# Patient Record
Sex: Male | Born: 1960
Health system: Southern US, Community
[De-identification: ages and names within clinical notes are randomized; demographics above are authoritative.]

## PROBLEM LIST (undated history)

## (undated) DIAGNOSIS — E785 Hyperlipidemia, unspecified: Secondary | ICD-10-CM

## (undated) DIAGNOSIS — M1990 Unspecified osteoarthritis, unspecified site: Secondary | ICD-10-CM

## (undated) DIAGNOSIS — I499 Cardiac arrhythmia, unspecified: Secondary | ICD-10-CM

## (undated) DIAGNOSIS — E119 Type 2 diabetes mellitus without complications: Secondary | ICD-10-CM

## (undated) DIAGNOSIS — M109 Gout, unspecified: Secondary | ICD-10-CM

## (undated) DIAGNOSIS — K219 Gastro-esophageal reflux disease without esophagitis: Secondary | ICD-10-CM

## (undated) DIAGNOSIS — K7460 Unspecified cirrhosis of liver: Secondary | ICD-10-CM

## (undated) DIAGNOSIS — I10 Essential (primary) hypertension: Secondary | ICD-10-CM

## (undated) DIAGNOSIS — D649 Anemia, unspecified: Secondary | ICD-10-CM

## (undated) HISTORY — PX: ROTATOR CUFF REPAIR: SHX139

## (undated) HISTORY — DX: Gout, unspecified: M10.9

## (undated) HISTORY — DX: Hyperlipidemia, unspecified: E78.5

## (undated) HISTORY — PX: ANTERIOR CRUCIATE LIGAMENT REPAIR: SHX115

## (undated) HISTORY — PX: KNEE SURGERY: SHX244

## (undated) HISTORY — PX: LUMBAR FUSION: SHX111

## (undated) HISTORY — DX: Anemia, unspecified: D64.9

---

## 2000-05-03 ENCOUNTER — Ambulatory Visit (HOSPITAL_COMMUNITY): Admission: RE | Admit: 2000-05-03 | Discharge: 2000-05-03 | Payer: Self-pay | Admitting: Orthopedic Surgery

## 2000-11-10 ENCOUNTER — Other Ambulatory Visit: Admission: RE | Admit: 2000-11-10 | Discharge: 2000-11-10 | Payer: Self-pay | Admitting: Gastroenterology

## 2001-11-22 ENCOUNTER — Encounter: Payer: Self-pay | Admitting: Emergency Medicine

## 2001-11-22 ENCOUNTER — Emergency Department (HOSPITAL_COMMUNITY): Admission: EM | Admit: 2001-11-22 | Discharge: 2001-11-22 | Payer: Self-pay | Admitting: Emergency Medicine

## 2001-11-30 ENCOUNTER — Emergency Department (HOSPITAL_COMMUNITY): Admission: EM | Admit: 2001-11-30 | Discharge: 2001-11-30 | Payer: Self-pay | Admitting: Emergency Medicine

## 2005-04-01 ENCOUNTER — Encounter: Admission: RE | Admit: 2005-04-01 | Discharge: 2005-04-01 | Payer: Self-pay

## 2005-04-06 ENCOUNTER — Ambulatory Visit (HOSPITAL_COMMUNITY): Admission: RE | Admit: 2005-04-06 | Discharge: 2005-04-06 | Payer: Self-pay

## 2005-04-06 ENCOUNTER — Ambulatory Visit (HOSPITAL_BASED_OUTPATIENT_CLINIC_OR_DEPARTMENT_OTHER): Admission: RE | Admit: 2005-04-06 | Discharge: 2005-04-06 | Payer: Self-pay

## 2005-05-24 ENCOUNTER — Emergency Department (HOSPITAL_COMMUNITY): Admission: EM | Admit: 2005-05-24 | Discharge: 2005-05-24 | Payer: Self-pay | Admitting: Emergency Medicine

## 2008-07-11 HISTORY — PX: HERNIA REPAIR: SHX51

## 2014-05-26 ENCOUNTER — Encounter: Payer: Self-pay | Admitting: Internal Medicine

## 2014-08-05 ENCOUNTER — Encounter: Payer: Self-pay | Admitting: Internal Medicine

## 2014-11-21 ENCOUNTER — Encounter (HOSPITAL_COMMUNITY): Payer: Self-pay

## 2014-11-21 ENCOUNTER — Emergency Department (HOSPITAL_COMMUNITY)
Admission: EM | Admit: 2014-11-21 | Discharge: 2014-11-21 | Disposition: A | Discharge status: Home / Self Care | Payer: Self-pay | Source: Home / Self Care | Attending: Family Medicine | Admitting: Family Medicine

## 2014-11-21 DIAGNOSIS — J0101 Acute recurrent maxillary sinusitis: Secondary | ICD-10-CM

## 2014-11-21 MED ORDER — IPRATROPIUM BROMIDE 0.06 % NA SOLN: 2.0000 | Freq: Four times a day (QID) | NASAL | Status: DC

## 2014-11-21 MED ORDER — DOXYCYCLINE HYCLATE 100 MG PO CAPS: 100.0000 mg | ORAL_CAPSULE | Freq: Two times a day (BID) | ORAL | Status: DC

## 2014-11-21 NOTE — ED Notes (Signed)
Provider eval only; NAD

## 2014-11-21 NOTE — ED Provider Notes (Signed)
CSN: [id number]     Arrival date & time 11/21/14  0803 History   First MD Initiated Contact with Patient 11/21/14 [phone]     Chief Complaint  Patient presents with  . Sore Throat   (Consider location/radiation/quality/duration/timing/severity/associated sxs/prior Treatment) Patient is a 54 y.o. male presenting with URI. The history is provided by the patient.  URI Presenting symptoms: congestion, cough, rhinorrhea and sore throat   Presenting symptoms: no fever   Severity:  Mild Onset quality:  Gradual Duration:  4 days Chronicity:  New Relieved by:  None tried Worsened by:  Nothing tried Ineffective treatments:  None tried Associated symptoms: no wheezing     History reviewed. No pertinent past medical history. Past Surgical History  Procedure Laterality Date  . Knee surgery     History reviewed. No pertinent family history. History  Substance Use Topics  . Smoking status: Never Smoker   . Smokeless tobacco: Not on file  . Alcohol Use: No    Review of Systems  Constitutional: Negative.  Negative for fever.  HENT: Positive for congestion, postnasal drip, rhinorrhea and sore throat.   Respiratory: Positive for cough. Negative for shortness of breath and wheezing.   Cardiovascular: Negative.   Gastrointestinal: Negative.     Allergies  Review of patient's allergies indicates no known allergies.  Home Medications   Prior to Admission medications   Medication Sig Start Date End Date Taking? Authorizing Provider  doxycycline (VIBRAMYCIN) 100 MG capsule Take 1 capsule (100 mg total) by mouth 2 (two) times daily. 11/21/14   Linna HoffJames D Kindl, MD  ipratropium (ATROVENT) 0.06 % nasal spray Place 2 sprays into both nostrils 4 (four) times daily. 11/21/14   Linna HoffJames D Kindl, MD   BP 168/100 mmHg  Pulse 94  Temp(Src) 98.4 F (36.9 C) (Oral)  Resp 14  SpO2 96% Physical Exam  Constitutional: He is oriented to person, place, and time. He appears well-developed and well-nourished.   HENT:  Right Ear: External ear normal.  Left Ear: External ear normal.  Mouth/Throat: Oropharynx is clear and moist.  Eyes: EOM are normal. Pupils are equal, round, and reactive to light. Right eye exhibits no discharge. Left eye exhibits no discharge. Right conjunctiva is injected. Left conjunctiva is injected.  Neck: Normal range of motion. Neck supple.  Cardiovascular: Normal heart sounds and intact distal pulses.   Pulmonary/Chest: Effort normal and breath sounds normal. He has no wheezes. He has no rales.  Lymphadenopathy:    He has no cervical adenopathy.  Neurological: He is alert and oriented to person, place, and time.  Skin: Skin is warm and dry.  Nursing note and vitals reviewed.   ED Course  Procedures (including critical care time) Labs Review Labs Reviewed - No data to display  Imaging Review No results found.   MDM   1. Acute recurrent maxillary sinusitis        Linna HoffJames D Kindl, MD 11/21/14 [phone]

## 2016-06-20 ENCOUNTER — Encounter: Payer: Self-pay | Admitting: Emergency Medicine

## 2016-06-20 ENCOUNTER — Emergency Department (INDEPENDENT_AMBULATORY_CARE_PROVIDER_SITE_OTHER)
Admission: EM | Admit: 2016-06-20 | Discharge: 2016-06-20 | Disposition: A | Discharge status: Home / Self Care | Payer: No Typology Code available for payment source | Source: Home / Self Care | Attending: Family Medicine | Admitting: Family Medicine

## 2016-06-20 DIAGNOSIS — R220 Localized swelling, mass and lump, head: Secondary | ICD-10-CM

## 2016-06-20 DIAGNOSIS — R030 Elevated blood-pressure reading, without diagnosis of hypertension: Secondary | ICD-10-CM

## 2016-06-20 MED ORDER — DOXYCYCLINE HYCLATE 100 MG PO CAPS: 100.0000 mg | ORAL_CAPSULE | Freq: Two times a day (BID) | ORAL | 0 refills | Status: DC

## 2016-06-20 NOTE — ED Provider Notes (Signed)
CSN: [id number]     Arrival date & time 06/20/16  1358 History   First MD Initiated Contact with Patient 06/20/16 1427     Chief Complaint  Patient presents with  . Facial Swelling   (Consider location/radiation/quality/duration/timing/severity/associated sxs/prior Treatment) HPI  Caleb [name] is a 55 y.o. male presenting to UC with c/o 3 days of gradually worsening Right side facial swelling that is aching and sore, worse with opening and closing his mouth. Pain is 5/10 at worst. He notes he had flu-like symptoms with body aches, cough and congestion last week. Those symptoms have nearly resolved, but now facial symptoms gradually worsening. Subjective fever.  Denies difficulty breathing or swallowing. Denies dental pain or inner ear pain.    BP elevated to 164/111. Pt denies known hx of HTN but also notes he does not have a PCP he f/u with routinely. Family hx of HTN. Denies chest pain, headache, dizziness, or SOB.  History reviewed. No pertinent past medical history. Past Surgical History:  Procedure Laterality Date  . KNEE SURGERY     No family history on file. Social History  Substance Use Topics  . Smoking status: Never Smoker  . Smokeless tobacco: Never Used  . Alcohol use Yes    Review of Systems  Constitutional: Positive for fever. Negative for chills and fatigue.  HENT: Positive for congestion and facial swelling (Right side). Negative for ear pain and sore throat.   Respiratory: Positive for cough. Negative for shortness of breath, wheezing and stridor.   Gastrointestinal: Negative for diarrhea, nausea and vomiting.  Musculoskeletal: Negative for arthralgias and myalgias.  Skin: Negative for color change, rash and wound.    Allergies  Patient has no known allergies.  Home Medications   Prior to Admission medications   Medication Sig Start Date End Date Taking? Authorizing Provider  ibuprofen (ADVIL,MOTRIN) 200 MG tablet Take 200 mg by mouth every 6 (six)  hours as needed.   Yes Historical Provider, MD  doxycycline (VIBRAMYCIN) 100 MG capsule Take 1 capsule (100 mg total) by mouth 2 (two) times daily. One po bid x 7 days 06/20/16   Junius FinnerErin O'Malley, PA-C  ipratropium (ATROVENT) 0.06 % nasal spray Place 2 sprays into both nostrils 4 (four) times daily. 11/21/14   Linna HoffJames D Kindl, MD   Meds Ordered and Administered this Visit  Medications - No data to display  BP (!) 164/111 (BP Location: Left Arm)   Pulse 111   Temp 98.4 F (36.9 C) (Oral)   Ht 6' (1.829 m)   Wt 262 lb (118.8 kg)   SpO2 98%   BMI 35.53 kg/m  No data found.   Physical Exam  Constitutional: He is oriented to person, place, and time. He appears well-developed and well-nourished. No distress.  HENT:  Head: Normocephalic and atraumatic.    Right Ear: Tympanic membrane normal. No mastoid tenderness.  Left Ear: Tympanic membrane normal. No mastoid tenderness.  Nose: Nose normal.  Mouth/Throat: Uvula is midline, oropharynx is clear and moist and mucous membranes are normal. No trismus in the jaw. Abnormal dentition.  Mild to moderate edema to Right side of face. Mildly tender. No warmth. Pt has beard, no significant erythema.  Multiple missing teeth. No gingival or dental tenderness.   Eyes: EOM are normal.  Neck: Normal range of motion. Neck supple.  Cardiovascular: Normal rate and regular rhythm.   Pulmonary/Chest: Effort normal and breath sounds normal. No respiratory distress. He has no wheezes. He has no rales.  Musculoskeletal:  Normal range of motion.  Neurological: He is alert and oriented to person, place, and time.  Skin: Skin is warm and dry. He is not diaphoretic.  Psychiatric: He has a normal mood and affect. His behavior is normal.  Nursing note and vitals reviewed.   Urgent Care Course   Clinical Course     Procedures (including critical care time)  Labs Review Labs Reviewed - No data to display  Imaging Review No results found.   MDM   1. Right  facial swelling   2. Elevated blood pressure reading    Right side facial swelling with mild tenderness. Concern for possible parotiditis vs cellulitis.  Rx: Doxycycline   BP elevated. Discussed risk of untreated HTN. Encouraged to establish care with a PCP for further evaluation and treatment of elevated BP. Patient verbalized understanding and agreement with treatment plan.     Junius Finnerrin O'Malley, PA-C 06/20/16 1515

## 2016-06-20 NOTE — ED Triage Notes (Addendum)
Right jaw swelling and painful x 3 days, sharp pain in right temple intermittently

## 2016-06-20 NOTE — Discharge Instructions (Signed)
°  Your blood pressure reading was high today.  That could be due to infection, pain, or anxiety of being in a medical office, however, it is important to follow up with a primary care provider next week for a blood pressure recheck.  If your blood pressure stays elevated and untreated, it can increase your risk of stroke, heart disease including heart attacks, or even kidney failure, which would mean you would need dialysis.  High blood pressure tends to go unnoticed unless you get routine exams as there are typically no side effects until it is too high. High blood pressure can be managed by a low sodium (salt) diet and exercise as well as medication (pills).  Typically it can be managed by one pill daily or twice daily, other times, multiple medications may be needed.

## 2018-10-15 ENCOUNTER — Ambulatory Visit: Payer: No Typology Code available for payment source | Admitting: Adult Health

## 2019-10-18 ENCOUNTER — Ambulatory Visit
Admission: RE | Admit: 2019-10-18 | Discharge: 2019-10-18 | Disposition: A | Discharge status: Home / Self Care | Payer: Worker's Compensation | Source: Ambulatory Visit | Attending: Nurse Practitioner | Admitting: Nurse Practitioner

## 2019-10-18 ENCOUNTER — Other Ambulatory Visit: Payer: Self-pay

## 2019-10-18 ENCOUNTER — Other Ambulatory Visit: Payer: Self-pay | Admitting: Nurse Practitioner

## 2019-10-18 DIAGNOSIS — M542 Cervicalgia: Secondary | ICD-10-CM

## 2020-03-03 ENCOUNTER — Encounter (HOSPITAL_COMMUNITY): Payer: Self-pay | Admitting: Emergency Medicine

## 2020-03-03 ENCOUNTER — Emergency Department (HOSPITAL_COMMUNITY)
Admission: EM | Admit: 2020-03-03 | Discharge: 2020-03-03 | Disposition: A | Discharge status: Home / Self Care | Payer: 59 | Attending: Emergency Medicine | Admitting: Emergency Medicine

## 2020-03-03 ENCOUNTER — Emergency Department (HOSPITAL_COMMUNITY): Payer: 59

## 2020-03-03 ENCOUNTER — Other Ambulatory Visit: Payer: Self-pay

## 2020-03-03 DIAGNOSIS — R112 Nausea with vomiting, unspecified: Secondary | ICD-10-CM | POA: Diagnosis not present

## 2020-03-03 DIAGNOSIS — R42 Dizziness and giddiness: Secondary | ICD-10-CM | POA: Diagnosis not present

## 2020-03-03 DIAGNOSIS — R509 Fever, unspecified: Secondary | ICD-10-CM | POA: Diagnosis present

## 2020-03-03 DIAGNOSIS — Z20822 Contact with and (suspected) exposure to covid-19: Secondary | ICD-10-CM | POA: Diagnosis not present

## 2020-03-03 DIAGNOSIS — B349 Viral infection, unspecified: Secondary | ICD-10-CM | POA: Diagnosis not present

## 2020-03-03 DIAGNOSIS — E119 Type 2 diabetes mellitus without complications: Secondary | ICD-10-CM | POA: Diagnosis not present

## 2020-03-03 LAB — CK: Total CK: 42 U/L — ABNORMAL LOW (ref 49–397)

## 2020-03-03 LAB — HEPATIC FUNCTION PANEL
ALT: 50 U/L — ABNORMAL HIGH (ref 0–44)
AST: 39 U/L (ref 15–41)
Albumin: 2.8 g/dL — ABNORMAL LOW (ref 3.5–5.0)
Alkaline Phosphatase: 113 U/L (ref 38–126)
Bilirubin, Direct: 0.2 mg/dL (ref 0.0–0.2)
Indirect Bilirubin: 0.7 mg/dL (ref 0.3–0.9)
Total Bilirubin: 0.9 mg/dL (ref 0.3–1.2)
Total Protein: 7.3 g/dL (ref 6.5–8.1)

## 2020-03-03 LAB — URINALYSIS, ROUTINE W REFLEX MICROSCOPIC
Bacteria, UA: NONE SEEN
Bilirubin Urine: NEGATIVE
Glucose, UA: >=500 mg/dL — AB
Hgb urine dipstick: NEGATIVE
Ketones, ur: NEGATIVE mg/dL
Leukocytes,Ua: NEGATIVE
Nitrite: NEGATIVE
Protein, ur: NEGATIVE mg/dL
Specific Gravity, Urine: 1.014 (ref 1.005–1.030)
pH: 6 (ref 5.0–8.0)

## 2020-03-03 LAB — BASIC METABOLIC PANEL
Anion gap: 15 (ref 5–15)
BUN: 20 mg/dL (ref 6–20)
CO2: 21 mmol/L — ABNORMAL LOW (ref 22–32)
Calcium: 8.8 mg/dL — ABNORMAL LOW (ref 8.9–10.3)
Chloride: 94 mmol/L — ABNORMAL LOW (ref 98–111)
Creatinine, Ser: 1 mg/dL (ref 0.61–1.24)
GFR calc Af Amer: >60 mL/min (ref >60)
GFR calc non Af Amer: >60 mL/min (ref >60)
Glucose, Bld: 346 mg/dL — ABNORMAL HIGH (ref 70–99)
Potassium: 3.8 mmol/L (ref 3.5–5.1)
Sodium: 130 mmol/L — ABNORMAL LOW (ref 135–145)

## 2020-03-03 LAB — LIPASE, BLOOD: Lipase: 33 U/L (ref 11–51)

## 2020-03-03 LAB — CBC
HCT: 45.4 % (ref 39.0–52.0)
Hemoglobin: 15.4 g/dL (ref 13.0–17.0)
MCH: 31 pg (ref 26.0–34.0)
MCHC: 33.9 g/dL (ref 30.0–36.0)
MCV: 91.3 fL (ref 80.0–100.0)
Platelets: 236 10*3/uL (ref 150–400)
RBC: 4.97 MIL/uL (ref 4.22–5.81)
RDW: 11.7 % (ref 11.5–15.5)
WBC: 6.5 10*3/uL (ref 4.0–10.5)
nRBC: 0 % (ref 0.0–0.2)

## 2020-03-03 LAB — HEMOGLOBIN A1C
Hgb A1c MFr Bld: 9.7 % — ABNORMAL HIGH (ref 4.8–5.6)
Mean Plasma Glucose: 231.69 mg/dL

## 2020-03-03 LAB — TROPONIN I (HIGH SENSITIVITY)
Troponin I (High Sensitivity): 10 ng/L (ref <18)
Troponin I (High Sensitivity): 12 ng/L (ref <18)

## 2020-03-03 LAB — SARS CORONAVIRUS 2 BY RT PCR (HOSPITAL ORDER, PERFORMED IN ~~LOC~~ HOSPITAL LAB): SARS Coronavirus 2: NEGATIVE

## 2020-03-03 MED ORDER — ACETAMINOPHEN 325 MG PO TABS
650.0000 mg | ORAL_TABLET | Freq: Once | ORAL | Status: AC | PRN
  Administered 2020-03-03: 650 mg via ORAL
  Filled 2020-03-03: qty 2

## 2020-03-03 MED ORDER — SODIUM CHLORIDE 0.9 % IV BOLUS
1000.0000 mL | Freq: Once | INTRAVENOUS | Status: AC
Start: 2020-03-03 — End: 2020-03-03
  Administered 2020-03-03: 1000 mL via INTRAVENOUS

## 2020-03-03 MED ORDER — ACETAMINOPHEN 500 MG PO TABS
1000.0000 mg | ORAL_TABLET | Freq: Once | ORAL | Status: AC
  Administered 2020-03-03: 1000 mg via ORAL
  Filled 2020-03-03: qty 2

## 2020-03-03 MED ORDER — METFORMIN HCL 500 MG PO TABS: 500.0000 mg | ORAL_TABLET | Freq: Two times a day (BID) | ORAL | 0 refills | Status: DC

## 2020-03-03 NOTE — Discharge Instructions (Signed)
Regarding your fever, chills, recommend taking Tylenol Motrin as needed for any fevers.  If you develop worsening vomiting, chest pain, difficulty breathing, neck pain, neck stiffness, abdominal pain or other new concerning symptom, please return to the emergency room for reassessment.  Regarding concern for new diagnosis of type 2 diabetes, it is very important that you follow-up with your primary care doctor.  Recommend starting the Metformin.

## 2020-03-03 NOTE — Progress Notes (Signed)
ED CM attempted to see the patient at the bedside. The patient has been discharged and has left the hospital. CM called the patient and informed him that he can call the number on his [facility] card to request the contact information for in-network providers. CM also provided the patient with the telephone number for [facility] to assist in locating an in-network provider. The patient verbalizes understanding.

## 2020-03-03 NOTE — ED Provider Notes (Signed)
[facility] EMERGENCY DEPARTMENT Provider Note   CSN: [id number] Arrival date & time: 03/03/20  1200     History Chief Complaint  Patient presents with  . Fever  . Dizziness    Caleb [name] is a 59 y.o. male.  Presents to ER with concern for fever, chills, lightheadedness.  Patient reports for the past couple weeks he has had intermittent episodes of chills, dull headache, feeling somewhat dizzy.  No room spinning sensation, more lightheaded, unsteadiness.  Earlier today had an episode of nausea and vomiting.  Currently does not feel nauseous, headache is mild, 1-2 out of 10 in severity, frontal.  Headache not sudden onset, no associated neck pain, neck stiffness.  No rash.  No abscess.    HPI     History reviewed. No pertinent past medical history.  There are no problems to display for this patient.   Past Surgical History:  Procedure Laterality Date  . KNEE SURGERY         History reviewed. No pertinent family history.  Social History   Tobacco Use  . Smoking status: Never Smoker  . Smokeless tobacco: Never Used  Substance Use Topics  . Alcohol use: Yes  . Drug use: Not on file    Home Medications Prior to Admission medications   Medication Sig Start Date End Date Taking? Authorizing Provider  doxycycline (VIBRAMYCIN) 100 MG capsule Take 1 capsule (100 mg total) by mouth 2 (two) times daily. One po bid x 7 days 06/20/16   Lurene Shadow, PA-C  ibuprofen (ADVIL,MOTRIN) 200 MG tablet Take 200 mg by mouth every 6 (six) hours as needed.    [provider]  ipratropium (ATROVENT) 0.06 % nasal spray Place 2 sprays into both nostrils 4 (four) times daily. 11/21/14   Linna Hoff, MD  metFORMIN (GLUCOPHAGE) 500 MG tablet Take 1 tablet (500 mg total) by mouth 2 (two) times daily with a meal. 03/03/20   Milagros Loll, MD    Allergies    Patient has no known allergies.  Review of Systems   Review of Systems  Constitutional:  Positive for chills and fever.  HENT: Negative for ear pain and sore throat.   Eyes: Negative for pain and visual disturbance.  Respiratory: Negative for cough and shortness of breath.   Cardiovascular: Negative for chest pain and palpitations.  Gastrointestinal: Positive for nausea and vomiting. Negative for abdominal pain.  Genitourinary: Negative for dysuria and hematuria.  Musculoskeletal: Negative for arthralgias and back pain.  Skin: Negative for color change and rash.  Neurological: Positive for dizziness and light-headedness. Negative for seizures and syncope.  All other systems reviewed and are negative.   Physical Exam Updated Vital Signs BP (!) 127/95   Pulse 94   Temp 97.8 F (36.6 C)   Resp 16   Ht 6\' 1"  (1.854 m)   Wt 113.4 kg   SpO2 99%   BMI 32.98 kg/m   Physical Exam Vitals and nursing note reviewed.  Constitutional:      Appearance: He is well-developed.  HENT:     Head: Normocephalic and atraumatic.     Nose: Nose normal.     Mouth/Throat:     Mouth: Mucous membranes are moist.  Eyes:     Conjunctiva/sclera: Conjunctivae normal.  Cardiovascular:     Rate and Rhythm: Normal rate and regular rhythm.     Heart sounds: No murmur heard.   Pulmonary:     Effort: Pulmonary effort is  normal. No respiratory distress.     Breath sounds: Normal breath sounds.  Abdominal:     Palpations: Abdomen is soft.     Tenderness: There is no abdominal tenderness.  Musculoskeletal:        General: No deformity or signs of injury.     Cervical back: Normal range of motion and neck supple. No rigidity or tenderness.  Skin:    General: Skin is warm and dry.     Capillary Refill: Capillary refill takes less than 2 seconds.  Neurological:     General: No focal deficit present.     Mental Status: He is alert and oriented to person, place, and time.  Psychiatric:        Mood and Affect: Mood normal.        Behavior: Behavior normal.     ED Results / Procedures /  Treatments   Labs (all labs ordered are listed, but only abnormal results are displayed) Labs Reviewed  BASIC METABOLIC PANEL - Abnormal; Notable for the following components:      Result Value   Sodium 130 (*)    Chloride 94 (*)    CO2 21 (*)    Glucose, Bld 346 (*)    Calcium 8.8 (*)    All other components within normal limits  URINALYSIS, ROUTINE W REFLEX MICROSCOPIC - Abnormal; Notable for the following components:   Glucose, UA >=500 (*)    All other components within normal limits  HEPATIC FUNCTION PANEL - Abnormal; Notable for the following components:   Albumin 2.8 (*)    ALT 50 (*)    All other components within normal limits  HEMOGLOBIN A1C - Abnormal; Notable for the following components:   Hgb A1c MFr Bld 9.7 (*)    All other components within normal limits  CK - Abnormal; Notable for the following components:   Total CK 42 (*)    All other components within normal limits  SARS CORONAVIRUS 2 BY RT PCR (HOSPITAL ORDER, PERFORMED IN [facility])  CBC  LIPASE, BLOOD  TROPONIN I (HIGH SENSITIVITY)  TROPONIN I (HIGH SENSITIVITY)    EKG EKG Interpretation  Date/Time:  Tuesday March 03 2020 12:02:39 EDT Ventricular Rate:  135 PR Interval:  126 QRS Duration: 92 QT Interval:  318 QTC Calculation: 477 R Axis:   49 Text Interpretation: Sinus tachycardia Possible Left atrial enlargement ST & T wave abnormality, consider inferior ischemia Abnormal ECG no acute STEMI Confirmed by Marianna Fuss ([id number]) on 03/03/2020 6:40:05 PM   Radiology DG Chest 2 View  Result Date: 03/03/2020 CLINICAL DATA:  Shortness of breath EXAM: CHEST - 2 VIEW COMPARISON:  None. FINDINGS: The heart size and mediastinal contours are within normal limits. Both lungs are clear. No pleural effusion or pneumothorax. The visualized skeletal structures are unremarkable. IMPRESSION: No acute process in the chest. Electronically Signed   By: Guadlupe Spanish M.D.   On: 03/03/2020 14:31    CT Head Wo Contrast  Result Date: 03/03/2020 CLINICAL DATA:  Headache EXAM: CT HEAD WITHOUT CONTRAST TECHNIQUE: Contiguous axial images were obtained from the base of the skull through the vertex without intravenous contrast. COMPARISON:  None. FINDINGS: Brain: No acute intracranial abnormality. Specifically, no hemorrhage, hydrocephalus, mass lesion, acute infarction, or significant intracranial injury. Vascular: No hyperdense vessel or unexpected calcification. Skull: No acute calvarial abnormality. Sinuses/Orbits: Visualized paranasal sinuses and mastoids clear. Orbital soft tissues unremarkable. Other: None IMPRESSION: No acute intracranial abnormality. Electronically Signed   By: Caryn Bee  Dover M.D.   On: 03/03/2020 20:43    Procedures Procedures (including critical care time)  Medications Ordered in ED Medications  acetaminophen (TYLENOL) tablet 650 mg (650 mg Oral Given 03/03/20 1214)  acetaminophen (TYLENOL) tablet 1,000 mg (1,000 mg Oral Given 03/03/20 1311)  sodium chloride 0.9 % bolus 1,000 mL (0 mLs Intravenous Stopped 03/03/20 2119)    ED Course  I have reviewed the triage vital signs and the nursing notes.  Pertinent labs & imaging results that were available during my care of the patient were reviewed by me and considered in my medical decision making (see chart for details).    MDM Rules/Calculators/A&P                         59 year old male who presented to ER with complaints.  Initial complaint was fever, chills, lightheaded episodes and occasional nausea.  On further questioning, patient endorsed polyuria dysuria and polydipsia for the past 3 or 4 weeks as well.  Regarding his fever, chest x-ray was negative, UA negative, LFTs negative, no abdominal tenderness, besides headache, he had no neck stiffness, neck pain or changes in mental status to suggest CNS process.  Given constellation of symptoms I suspect most likely he has an acute viral process.  Covid was negative.   Regarding this acute illness, his blood pressure is stable, well-appearing, believe appropriate for discharge and outpatient management at this time.  On initial labs, noted elevated blood sugar, sent A1c which was 9.7, consistent with type 2 diabetes.  Has not previously been diagnosed.  Patient does not have a primary doctor.  This can explain some of his symptoms as well, will initiate Metformin therapy, stressed need for close follow-up with a primary care doctor, asked case management to help arrange PCP follow-up.  I reviewed all of his work-up in detail as well as return precautions and outpatient plan in detail.  Patient demonstrated understanding, discharged home.     After the discussed management above, the patient was determined to be safe for discharge.  The patient was in agreement with this plan and all questions regarding their care were answered.  ED return precautions were discussed and the patient will return to the ED with any significant worsening of condition.    Final Clinical Impression(s) / ED Diagnoses Final diagnoses:  Acute viral syndrome  Type 2 diabetes mellitus without complication, unspecified whether long term insulin use (HCC)    Rx / DC Orders ED Discharge Orders         Ordered    metFORMIN (GLUCOPHAGE) 500 MG tablet  2 times daily with meals        03/03/20 2104           Milagros Loll, MD 03/04/20 1701

## 2020-03-03 NOTE — ED Triage Notes (Signed)
Patient arrives to ED with complaints of fever, chills, and dizziness x1 week. Per pt he was at work today and got lightheaded then vomited. Pt denies chest pain and shortness of breath.

## 2020-03-06 ENCOUNTER — Other Ambulatory Visit: Payer: Self-pay

## 2020-03-06 ENCOUNTER — Encounter (HOSPITAL_COMMUNITY): Payer: Self-pay

## 2020-03-06 ENCOUNTER — Ambulatory Visit (HOSPITAL_COMMUNITY)
Admission: EM | Admit: 2020-03-06 | Discharge: 2020-03-06 | Disposition: A | Discharge status: Home / Self Care | Payer: 59 | Attending: Physician Assistant | Admitting: Physician Assistant

## 2020-03-06 DIAGNOSIS — R42 Dizziness and giddiness: Secondary | ICD-10-CM | POA: Diagnosis present

## 2020-03-06 DIAGNOSIS — R112 Nausea with vomiting, unspecified: Secondary | ICD-10-CM | POA: Diagnosis present

## 2020-03-06 DIAGNOSIS — E119 Type 2 diabetes mellitus without complications: Secondary | ICD-10-CM | POA: Diagnosis not present

## 2020-03-06 LAB — COMPREHENSIVE METABOLIC PANEL
ALT: 33 U/L (ref 0–44)
AST: 26 U/L (ref 15–41)
Albumin: 2.8 g/dL — ABNORMAL LOW (ref 3.5–5.0)
Alkaline Phosphatase: 110 U/L (ref 38–126)
Anion gap: 12 (ref 5–15)
BUN: 14 mg/dL (ref 6–20)
CO2: 23 mmol/L (ref 22–32)
Calcium: 9 mg/dL (ref 8.9–10.3)
Chloride: 96 mmol/L — ABNORMAL LOW (ref 98–111)
Creatinine, Ser: 0.73 mg/dL (ref 0.61–1.24)
GFR calc Af Amer: >60 mL/min (ref >60)
GFR calc non Af Amer: >60 mL/min (ref >60)
Glucose, Bld: 216 mg/dL — ABNORMAL HIGH (ref 70–99)
Potassium: 3.7 mmol/L (ref 3.5–5.1)
Sodium: 131 mmol/L — ABNORMAL LOW (ref 135–145)
Total Bilirubin: 0.8 mg/dL (ref 0.3–1.2)
Total Protein: 7.6 g/dL (ref 6.5–8.1)

## 2020-03-06 LAB — CBC
HCT: 43.4 % (ref 39.0–52.0)
Hemoglobin: 15 g/dL (ref 13.0–17.0)
MCH: 31.3 pg (ref 26.0–34.0)
MCHC: 34.6 g/dL (ref 30.0–36.0)
MCV: 90.6 fL (ref 80.0–100.0)
Platelets: 157 10*3/uL (ref 150–400)
RBC: 4.79 MIL/uL (ref 4.22–5.81)
RDW: 11.9 % (ref 11.5–15.5)
WBC: 8.6 10*3/uL (ref 4.0–10.5)
nRBC: 0 % (ref 0.0–0.2)

## 2020-03-06 LAB — CBG MONITORING, ED: Glucose-Capillary: 203 mg/dL — ABNORMAL HIGH (ref 70–99)

## 2020-03-06 MED ORDER — FAMOTIDINE 20 MG PO TABS: 20.0000 mg | ORAL_TABLET | Freq: Two times a day (BID) | ORAL | 0 refills | Status: DC

## 2020-03-06 NOTE — Discharge Instructions (Signed)
Continue your medications for diabetes We have sent labs I will call if we need to discuss, otherwise check your mychart Drink plenty of fluids  Take the pepcid as prescribed  Call the internal medicine center and cardiology clinic first thing Monday for close follow up  If you get light headed, have shortness of breath chest pain, go to the Emergency department
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
## 2020-03-06 NOTE — ED Triage Notes (Signed)
Pt c/o lightheadedness, chills in early evening, loss of appetitex3 wks. Pt c/o vomiting in the morningsx3 days. Pt was told in ED bs was elevated.

## 2020-03-11 ENCOUNTER — Other Ambulatory Visit: Payer: Self-pay

## 2020-03-11 ENCOUNTER — Encounter: Payer: Self-pay | Admitting: Family Medicine

## 2020-03-11 ENCOUNTER — Ambulatory Visit: Payer: 59 | Admitting: Family Medicine

## 2020-03-11 DIAGNOSIS — M1A00X0 Idiopathic chronic gout, unspecified site, without tophus (tophi): Secondary | ICD-10-CM | POA: Diagnosis not present

## 2020-03-11 DIAGNOSIS — IMO0002 Reserved for concepts with insufficient information to code with codable children: Secondary | ICD-10-CM | POA: Insufficient documentation

## 2020-03-11 DIAGNOSIS — E1165 Type 2 diabetes mellitus with hyperglycemia: Secondary | ICD-10-CM | POA: Insufficient documentation

## 2020-03-11 DIAGNOSIS — E119 Type 2 diabetes mellitus without complications: Secondary | ICD-10-CM | POA: Diagnosis not present

## 2020-03-11 DIAGNOSIS — M109 Gout, unspecified: Secondary | ICD-10-CM | POA: Insufficient documentation

## 2020-03-11 MED ORDER — TRUE METRIX AIR GLUCOSE METER DEVI: 11 refills | Status: AC

## 2020-03-11 MED ORDER — METFORMIN HCL 500 MG PO TABS: 500.0000 mg | ORAL_TABLET | Freq: Two times a day (BID) | ORAL | 11 refills | Status: DC
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
## 2020-03-13 ENCOUNTER — Telehealth: Payer: Self-pay | Admitting: Family Medicine

## 2020-03-14 MED ORDER — METFORMIN HCL 500 MG PO TABS: 500.0000 mg | ORAL_TABLET | Freq: Two times a day (BID) | ORAL | 11 refills | Status: DC

## 2020-03-14 NOTE — Addendum Note (Signed)
Addended by: Lillia Carmel on: 03/14/2020 11:31 AM   Modules accepted: Orders

## 2020-04-03 ENCOUNTER — Inpatient Hospital Stay (HOSPITAL_COMMUNITY)
Admission: AD | Admit: 2020-04-03 | Discharge: 2020-04-08 | DRG: 871 | Disposition: A | Discharge status: Home Health | Payer: 59 | Source: Ambulatory Visit | Attending: Internal Medicine | Admitting: Internal Medicine

## 2020-04-03 ENCOUNTER — Other Ambulatory Visit: Payer: Self-pay | Admitting: Sports Medicine

## 2020-04-03 ENCOUNTER — Ambulatory Visit
Admission: RE | Admit: 2020-04-03 | Discharge: 2020-04-03 | Disposition: A | Discharge status: Home / Self Care | Payer: 59 | Source: Ambulatory Visit | Attending: Sports Medicine | Admitting: Sports Medicine

## 2020-04-03 DIAGNOSIS — R109 Unspecified abdominal pain: Secondary | ICD-10-CM

## 2020-04-03 DIAGNOSIS — R7881 Bacteremia: Secondary | ICD-10-CM | POA: Diagnosis not present

## 2020-04-03 DIAGNOSIS — B955 Unspecified streptococcus as the cause of diseases classified elsewhere: Secondary | ICD-10-CM | POA: Diagnosis not present

## 2020-04-03 DIAGNOSIS — E1169 Type 2 diabetes mellitus with other specified complication: Secondary | ICD-10-CM | POA: Diagnosis present

## 2020-04-03 DIAGNOSIS — A419 Sepsis, unspecified organism: Secondary | ICD-10-CM | POA: Diagnosis not present

## 2020-04-03 DIAGNOSIS — R6520 Severe sepsis without septic shock: Secondary | ICD-10-CM | POA: Diagnosis not present

## 2020-04-03 DIAGNOSIS — A408 Other streptococcal sepsis: Secondary | ICD-10-CM | POA: Diagnosis present

## 2020-04-03 DIAGNOSIS — M109 Gout, unspecified: Secondary | ICD-10-CM | POA: Diagnosis present

## 2020-04-03 DIAGNOSIS — I4891 Unspecified atrial fibrillation: Secondary | ICD-10-CM | POA: Diagnosis present

## 2020-04-03 DIAGNOSIS — M4626 Osteomyelitis of vertebra, lumbar region: Secondary | ICD-10-CM

## 2020-04-03 DIAGNOSIS — M4645 Discitis, unspecified, thoracolumbar region: Secondary | ICD-10-CM | POA: Diagnosis not present

## 2020-04-03 DIAGNOSIS — R1030 Lower abdominal pain, unspecified: Secondary | ICD-10-CM

## 2020-04-03 DIAGNOSIS — I471 Supraventricular tachycardia: Secondary | ICD-10-CM | POA: Diagnosis present

## 2020-04-03 DIAGNOSIS — I4892 Unspecified atrial flutter: Secondary | ICD-10-CM | POA: Diagnosis not present

## 2020-04-03 DIAGNOSIS — Z7984 Long term (current) use of oral hypoglycemic drugs: Secondary | ICD-10-CM

## 2020-04-03 DIAGNOSIS — I483 Typical atrial flutter: Secondary | ICD-10-CM | POA: Diagnosis present

## 2020-04-03 DIAGNOSIS — E871 Hypo-osmolality and hyponatremia: Secondary | ICD-10-CM | POA: Diagnosis present

## 2020-04-03 DIAGNOSIS — Z8679 Personal history of other diseases of the circulatory system: Secondary | ICD-10-CM

## 2020-04-03 DIAGNOSIS — G061 Intraspinal abscess and granuloma: Secondary | ICD-10-CM | POA: Diagnosis present

## 2020-04-03 DIAGNOSIS — M545 Low back pain: Secondary | ICD-10-CM | POA: Diagnosis present

## 2020-04-03 DIAGNOSIS — M4646 Discitis, unspecified, lumbar region: Secondary | ICD-10-CM | POA: Diagnosis present

## 2020-04-03 DIAGNOSIS — E1165 Type 2 diabetes mellitus with hyperglycemia: Secondary | ICD-10-CM | POA: Diagnosis present

## 2020-04-03 DIAGNOSIS — M4640 Discitis, unspecified, site unspecified: Secondary | ICD-10-CM | POA: Diagnosis present

## 2020-04-03 DIAGNOSIS — U071 COVID-19: Secondary | ICD-10-CM

## 2020-04-03 MED ORDER — SODIUM CHLORIDE 0.9 % IV SOLN
2.0000 g | Freq: Once | INTRAVENOUS | Status: AC
  Administered 2020-04-04: 2 g via INTRAVENOUS
  Filled 2020-04-03: qty 2

## 2020-04-03 MED ORDER — ENOXAPARIN SODIUM 40 MG/0.4ML ~~LOC~~ SOLN
40.0000 mg | SUBCUTANEOUS | Status: DC
  Administered 2020-04-04 – 2020-04-05 (×2): 40 mg via SUBCUTANEOUS
  Filled 2020-04-03 (×2): qty 0.4

## 2020-04-03 MED ORDER — METRONIDAZOLE IN NACL 5-0.79 MG/ML-% IV SOLN
500.0000 mg | Freq: Three times a day (TID) | INTRAVENOUS | Status: DC
  Administered 2020-04-04: 500 mg via INTRAVENOUS
  Filled 2020-04-03: qty 100

## 2020-04-03 MED ORDER — IOPAMIDOL (ISOVUE-300) INJECTION 61%
100.0000 mL | Freq: Once | INTRAVENOUS | Status: AC | PRN
  Administered 2020-04-03: 100 mL via INTRAVENOUS

## 2020-04-03 MED ORDER — VANCOMYCIN HCL 2000 MG/400ML IV SOLN
2000.0000 mg | Freq: Once | INTRAVENOUS | Status: AC
  Administered 2020-04-04: 2000 mg via INTRAVENOUS
  Filled 2020-04-03: qty 400

## 2020-04-03 MED ORDER — LACTATED RINGERS IV SOLN: INTRAVENOUS | Status: DC

## 2020-04-03 MED ORDER — INSULIN ASPART 100 UNIT/ML ~~LOC~~ SOLN: 0.0000 [IU] | Freq: Three times a day (TID) | SUBCUTANEOUS | Status: DC

## 2020-04-03 NOTE — H&P (Addendum)
History and Physical  CEBERT DETTMANN UXN:[id number] DOB: 1960-08-27 DOA: 04/03/2020  Referring physician: Dr. Antionette Char  PCP: Lavada Mesi, MD  Outpatient Specialists: Orthopedic surgery Patient coming from: Direct admission [facility]  Chief Complaint: 1 month of back pain, malaise and weight loss  HPI: Caleb [name] is a 59 y.o. male with medical history significant for type 2 diabetes, gout was seen at [facility] on the day of presentation due to severe back pain of 4 weeks duration.  Received a steroid injection to his back 3 weeks ago with minimal improvement of his pain.  Associated with generalized malaise and unintentional weight loss.  Denies any trauma or skin infection.  He was sent for CT abdomen and pelvis with contrast which revealed findings concerning for L3-L4 discitis/osteomyelitis.  Dr. Lucie Leather with Ortho consulted neurosurgery Dr. Maurice Small, recommended medical admission and MRI lumbar spine with and without contrast.  He was admitted as a direct admit at [facility].  ED Course: Direct admit from ortho clinic.  While vital signs were taken in the room by nursing staff, patient was noted to be tachycardic.  12 lead EKG was ordered to further assess.  Review of Systems: Review of systems as noted in the HPI. All other systems reviewed and are negative.   No past medical history on file. Past Surgical History:  Procedure Laterality Date  . KNEE SURGERY    . ROTATOR CUFF REPAIR Right     Social History:  reports that he has never smoked. He has never used smokeless tobacco. He reports previous alcohol use. He reports that he does not use drugs.   No Known Allergies  Family History  Problem Relation Age of Onset  . Cancer Father   . Lung cancer Father   . Healthy Sister   . Diabetes Neg Hx   . Heart attack Neg Hx   . Gout Neg Hx       Prior to Admission medications   Medication Sig Start Date End Date Taking? Authorizing  Provider  Blood Glucose Monitoring Suppl (TRUE METRIX AIR GLUCOSE METER) DEVI Check CBG BID 03/11/20   Hilts, Michael, MD  colchicine 0.6 MG tablet Take 0.6 mg by mouth 2 (two) times daily as needed. 02/29/20   [provider]  metFORMIN (GLUCOPHAGE) 500 MG tablet Take 1 tablet (500 mg total) by mouth 2 (two) times daily with a meal. 03/14/20   Hilts, Michael, MD    Physical Exam: There were no vitals taken for this visit.  . General: 59 y.o. year-old male well developed well nourished in no acute distress.  Alert and oriented x3. . Cardiovascular: Tachycardic with no rubs or gallops.  No thyromegaly or JVD noted.  No lower extremity edema. 2/4 pulses in all 4 extremities. Marland Kitchen Respiratory: Clear to auscultation with no wheezes or rales. Good inspiratory effort. . Abdomen: Soft nontender nondistended with normal bowel sounds x4 quadrants. . Muskuloskeletal: No cyanosis, clubbing or edema noted bilaterally . Neuro: CN II-XII intact, strength, sensation, reflexes . Skin: No ulcerative lesions noted.  Mild skin rash noted in the center of his lower back. . Psychiatry: Judgement and insight appear normal. Mood is appropriate for condition and setting          Labs on Admission:  Basic Metabolic Panel: No results for input(s): NA, K, CL, CO2, GLUCOSE, BUN, CREATININE, CALCIUM, MG, PHOS in the last 168 hours. Liver Function Tests: No results for input(s): AST, ALT, ALKPHOS, BILITOT, PROT,  ALBUMIN in the last 168 hours. No results for input(s): LIPASE, AMYLASE in the last 168 hours. No results for input(s): AMMONIA in the last 168 hours. CBC: No results for input(s): WBC, NEUTROABS, HGB, HCT, MCV, PLT in the last 168 hours. Cardiac Enzymes: No results for input(s): CKTOTAL, CKMB, CKMBINDEX, TROPONINI in the last 168 hours.  BNP (last 3 results) No results for input(s): BNP in the last 8760 hours.  ProBNP (last 3 results) No results for input(s): PROBNP in the last 8760  hours.  CBG: No results for input(s): GLUCAP in the last 168 hours.  Radiological Exams on Admission: CT ABDOMEN PELVIS W CONTRAST  Result Date: 04/03/2020 CLINICAL DATA:  59 year old male with history of bilateral inguinal pain for the past 3 weeks. EXAM: CT ABDOMEN AND PELVIS WITH CONTRAST TECHNIQUE: Multidetector CT imaging of the abdomen and pelvis was performed using the standard protocol following bolus administration of intravenous contrast. CONTRAST:  ISOVUE-300 IOPAMIDOL (ISOVUE-300) INJECTION 61% COMPARISON:  No priors. FINDINGS: Lower chest: Patchy areas of ground-glass attenuation are noted in the visualized lung bases, most severe in the right lower lobe. Hepatobiliary: Diffuse low attenuation throughout the hepatic parenchyma, indicative of hepatic steatosis. Liver has a slightly shrunken appearance and nodular contour, indicative of underlying cirrhosis. No suspicious cystic or solid hepatic lesions. No intra or extrahepatic biliary ductal dilatation. Gallbladder is normal in appearance. Pancreas: No pancreatic mass. No pancreatic ductal dilatation. No pancreatic or peripancreatic fluid collections or inflammatory changes. Spleen: Large centrally low-intermediate attenuation peripherally calcified lesion in the spleen, likely sequela of remote trauma. Adrenals/Urinary Tract: Subcentimeter low-attenuation lesion in the upper pole the right kidney, too small to characterize, but statistically likely to represent a tiny cyst. Left kidney and bilateral adrenal glands are normal in appearance. No hydroureteronephrosis. Urinary bladder is normal in appearance. Stomach/Bowel: Normal appearance of the stomach. No pathologic dilatation of small bowel or colon. A few scattered colonic diverticulae are noted, without surrounding inflammatory changes to suggest an acute diverticulitis at this time. Normal appendix. Vascular/Lymphatic: Aortic atherosclerosis, without evidence of aneurysm or  dissection in the abdominal or pelvic vasculature. No lymphadenopathy noted in the abdomen or pelvis. Reproductive: Prostate gland and seminal vesicles are unremarkable in appearance. Other: No significant volume of ascites. No pneumoperitoneum. No inguinal hernias are identified. Musculoskeletal: There are destructive changes in the inferior endplate of L3 and superior endplate of L4 with expansion of the disc at the L3-L4 interspace and peripheral enhancement of the disc material which is expanded into the adjacent soft tissues. Small lucent lesion also noted in the L3 spinous process posteriorly (sagittal image 108 of series 8). IMPRESSION: 1. Findings are highly concerning for L3-L4 discitis/osteomyelitis. Further evaluation with MRI of the lumbar spine with and without IV gadolinium is recommended to better evaluate these findings. 2. Small lucent lesion in the L3 spinous process is nonspecific, potentially a hemangioma. Attention at time of forthcoming MRI is recommended. 3. Patchy areas of ground-glass attenuation in the right lung base, most evident in the right lower lobe. Clinical correlation for signs and symptoms of bronchopneumonia is recommended. 4. Hepatic steatosis with morphologic changes in the liver suggesting early cirrhosis. 5. Colonic diverticulosis without evidence of acute diverticulitis at this time. 6. Aortic atherosclerosis. These results will be called to the ordering clinician or representative by the Radiologist Assistant, and communication documented in the PACS or [employer]. Electronically Signed   By: Trudie Reed M.D.   On: 04/03/2020 16:33    EKG: I independently  viewed the EKG done and my findings are as followed: None available at the time of this visit.  Assessment/Plan Present on Admission: . Diskitis  Active Problems:   Diskitis  Newly diagnosed L3/L4 discitis/osteomyelitis, POA Presented to Ortho clinic with 1 month of back pain Unclear source of  diskitis, denies any trauma or skin infection CT abdomen pelvis with contrast done on 04/03/2020 showed: Findings concerning for L3-L4 discitis/osteomyelitis. Obtain MRSA screening Obtain MRI of the lumbar spine with and without contrast. Obtain blood cultures x2 peripherally Start IV antibiotics empirically, IV vancomycin, cefepime, IV Flagyl. Monitor fever curve and WBC Follow cultures Neurosurgery will see in consultation  Groundglass attenuation lower lung lobes Most severe in the right lower lobe Seen on CT abdomen and pelvis with contrast 04/03/20 Obtain COVID-19 screening test Monitor O2 saturation and maintain above 92%  Tachycardia, unclear etiology While vital signs were taken in the room by nursing staff, patient was noted to be tachycardic.  12 lead EKG was ordered to further assess. Follow EKG results Obtain TSH Treat with IV lopressor PRN with parameters If patient is in A. Fib with RvR, order 2D echo Continue to monitor on telemetry  Type 2 diabetes with hyperglycemia Hemoglobin A1c 9.7 on 03/03/2020 Hold off home oral hypoglycemics Start insulin sliding scale  History of gout  Currently with no flare     DVT prophylaxis: Subcu Lovenox daily  Code Status: Full code as stated by the patient himself.  Family Communication: Updated his wife at bedside.  Disposition Plan: Admit to telemetry surgical.  Consults called: Neurosurgery consulted orthopedic surgery as stated above  Admission status: Inpatient status   Status is: Inpatient    Dispo: The patient is from: Home.              Anticipated d/c is to: Home.              Anticipated d/c date is: 04/06/2020.              Patient currently not medically stable for discharge due to ongoing management of newly diagnosed L3-4 discitis.      Darlin Drop MD [facility] Pager [phone]  If 7PM-7AM, please contact night-coverage [email or web address] Password [facility]  04/03/2020, 9:42 PM

## 2020-04-03 NOTE — Significant Event (Addendum)
Rapid Response Event Note   Reason for Call : Tachycardia ST 170 Initial Focused Assessment:  Notified by staff that pt's HR is 170. EKG already performed and shows ST 170. Upon arrival, Caleb [name] is alert, oriented x4. Denies CP and SOB. He does endorse some back pain with movement. Skin pink, warm and dry. Pt is in no distress.  HR 170, 99/81 (88), RR 18 with RA sats 96%   Interventions:  -No RRT interventions  Plan of Care:  -Notify primary service of events and further orders -Notify primary service and/or RRRN if any clinical decompensation -Pt may respond to IVF    Call Time: 2303 Arrival Time: 2315 End Time: 2330  Rose Fillers, RN

## 2020-04-03 NOTE — Progress Notes (Addendum)
Pharmacy Antibiotic Note  DAL BLEW is a 59 y.o. male admitted on 04/03/2020 as a direct admit from [facility] with suspected L3-L4 discitis/osteomyelitis.  Pharmacy has been consulted for vancomycin and cefepime dosing.  WBC, Scr pending  Plan: Vancomycin 2 gm IV X 1 Cefepime 2 gm IV X 1 Review Scr when available this evening to determine subsequent antibiotic dosing Monitor WBC, temp, clinical improvement, cultures, renal function, vancomycin levels as indicated  Total Body Weight: 96.6 kg Height: 73 inches    CrCl cannot be calculated (Patient's most recent lab result is older than the maximum 21 days allowed.).    No Known Allergies  Antimicrobials this admission: 9/24 metronidazole >> 9/24 cefepime >> 9/24 vancomycin >>  Microbiology results: 9/24 Bld cx X 2: pending 9/24 MRSA PCR: pending 9/24 COVID: pending  Thank you for allowing pharmacy to be a part of this patient's care.  Vicki Mallet, PharmD, BCPS, [facility] Clinical Pharmacist 04/03/2020 9:57 PM

## 2020-04-04 ENCOUNTER — Encounter (HOSPITAL_COMMUNITY): Payer: Self-pay | Admitting: Internal Medicine

## 2020-04-04 ENCOUNTER — Inpatient Hospital Stay (HOSPITAL_COMMUNITY): Payer: 59

## 2020-04-04 DIAGNOSIS — A419 Sepsis, unspecified organism: Secondary | ICD-10-CM

## 2020-04-04 DIAGNOSIS — E871 Hypo-osmolality and hyponatremia: Secondary | ICD-10-CM

## 2020-04-04 DIAGNOSIS — M4626 Osteomyelitis of vertebra, lumbar region: Secondary | ICD-10-CM

## 2020-04-04 DIAGNOSIS — R6520 Severe sepsis without septic shock: Secondary | ICD-10-CM

## 2020-04-04 HISTORY — DX: Sepsis, unspecified organism: A41.9

## 2020-04-04 LAB — HEMOGLOBIN A1C
Hgb A1c MFr Bld: 8.7 % — ABNORMAL HIGH (ref 4.8–5.6)
Mean Plasma Glucose: 202.99 mg/dL

## 2020-04-04 LAB — COMPREHENSIVE METABOLIC PANEL
ALT: 88 U/L — ABNORMAL HIGH (ref 0–44)
AST: 89 U/L — ABNORMAL HIGH (ref 15–41)
Albumin: 2.5 g/dL — ABNORMAL LOW (ref 3.5–5.0)
Alkaline Phosphatase: 119 U/L (ref 38–126)
Anion gap: 12 (ref 5–15)
BUN: 14 mg/dL (ref 6–20)
CO2: 20 mmol/L — ABNORMAL LOW (ref 22–32)
Calcium: 8.4 mg/dL — ABNORMAL LOW (ref 8.9–10.3)
Chloride: 100 mmol/L (ref 98–111)
Creatinine, Ser: 0.75 mg/dL (ref 0.61–1.24)
GFR calc Af Amer: >60 mL/min (ref >60)
GFR calc non Af Amer: >60 mL/min (ref >60)
Glucose, Bld: 138 mg/dL — ABNORMAL HIGH (ref 70–99)
Potassium: 3.4 mmol/L — ABNORMAL LOW (ref 3.5–5.1)
Sodium: 132 mmol/L — ABNORMAL LOW (ref 135–145)
Total Bilirubin: 0.9 mg/dL (ref 0.3–1.2)
Total Protein: 7.2 g/dL (ref 6.5–8.1)

## 2020-04-04 LAB — CBC
HCT: 40.6 % (ref 39.0–52.0)
Hemoglobin: 13.6 g/dL (ref 13.0–17.0)
MCH: 29.6 pg (ref 26.0–34.0)
MCHC: 33.5 g/dL (ref 30.0–36.0)
MCV: 88.5 fL (ref 80.0–100.0)
Platelets: 209 10*3/uL (ref 150–400)
RBC: 4.59 MIL/uL (ref 4.22–5.81)
RDW: 12.8 % (ref 11.5–15.5)
WBC: 5.4 10*3/uL (ref 4.0–10.5)
nRBC: 0 % (ref 0.0–0.2)

## 2020-04-04 LAB — GLUCOSE, CAPILLARY
Glucose-Capillary: 92 mg/dL (ref 70–99)
Glucose-Capillary: 98 mg/dL (ref 70–99)
Glucose-Capillary: 99 mg/dL (ref 70–99)
Glucose-Capillary: 132 mg/dL — ABNORMAL HIGH (ref 70–99)

## 2020-04-04 LAB — CBC WITH DIFFERENTIAL/PLATELET
Abs Immature Granulocytes: 0.01 10*3/uL (ref 0.00–0.07)
Basophils Absolute: 0 10*3/uL (ref 0.0–0.1)
Basophils Relative: 0 %
Eosinophils Absolute: 0 10*3/uL (ref 0.0–0.5)
Eosinophils Relative: 0 %
HCT: 39.3 % (ref 39.0–52.0)
Hemoglobin: 13.1 g/dL (ref 13.0–17.0)
Immature Granulocytes: 0 %
Lymphocytes Relative: 18 %
Lymphs Abs: 1 10*3/uL (ref 0.7–4.0)
MCH: 29.5 pg (ref 26.0–34.0)
MCHC: 33.3 g/dL (ref 30.0–36.0)
MCV: 88.5 fL (ref 80.0–100.0)
Monocytes Absolute: 0.4 10*3/uL (ref 0.1–1.0)
Monocytes Relative: 8 %
Neutro Abs: 3.8 10*3/uL (ref 1.7–7.7)
Neutrophils Relative %: 74 %
Platelets: 180 10*3/uL (ref 150–400)
RBC: 4.44 MIL/uL (ref 4.22–5.81)
RDW: 12.7 % (ref 11.5–15.5)
WBC: 5.2 10*3/uL (ref 4.0–10.5)
nRBC: 0 % (ref 0.0–0.2)

## 2020-04-04 LAB — BLOOD CULTURE ID PANEL (REFLEXED) - BCID2

## 2020-04-04 LAB — BASIC METABOLIC PANEL
Anion gap: 14 (ref 5–15)
BUN: 13 mg/dL (ref 6–20)
CO2: 21 mmol/L — ABNORMAL LOW (ref 22–32)
Calcium: 8.3 mg/dL — ABNORMAL LOW (ref 8.9–10.3)
Chloride: 98 mmol/L (ref 98–111)
Creatinine, Ser: 0.78 mg/dL (ref 0.61–1.24)
GFR calc Af Amer: >60 mL/min (ref >60)
GFR calc non Af Amer: >60 mL/min (ref >60)
Glucose, Bld: 130 mg/dL — ABNORMAL HIGH (ref 70–99)
Potassium: 3.6 mmol/L (ref 3.5–5.1)
Sodium: 133 mmol/L — ABNORMAL LOW (ref 135–145)

## 2020-04-04 LAB — PHOSPHORUS: Phosphorus: 3.6 mg/dL (ref 2.5–4.6)

## 2020-04-04 LAB — LACTIC ACID, PLASMA: Lactic Acid, Venous: 1 mmol/L (ref 0.5–1.9)

## 2020-04-04 LAB — RAPID URINE DRUG SCREEN, HOSP PERFORMED
Amphetamines: NOT DETECTED
Barbiturates: NOT DETECTED
Benzodiazepines: NOT DETECTED
Cocaine: NOT DETECTED
Opiates: NOT DETECTED
Tetrahydrocannabinol: NOT DETECTED

## 2020-04-04 LAB — FIBRINOGEN: Fibrinogen: 726 mg/dL — ABNORMAL HIGH (ref 210–475)

## 2020-04-04 LAB — RESPIRATORY PANEL BY RT PCR (FLU A&B, COVID)
Influenza A by PCR: NEGATIVE
Influenza B by PCR: NEGATIVE
SARS Coronavirus 2 by RT PCR: POSITIVE — AB

## 2020-04-04 LAB — TSH: TSH: 3.064 u[IU]/mL (ref 0.350–4.500)

## 2020-04-04 LAB — HIV ANTIBODY (ROUTINE TESTING W REFLEX): HIV Screen 4th Generation wRfx: NONREACTIVE

## 2020-04-04 LAB — MAGNESIUM: Magnesium: 1.6 mg/dL — ABNORMAL LOW (ref 1.7–2.4)

## 2020-04-04 LAB — C-REACTIVE PROTEIN: CRP: 7 mg/dL — ABNORMAL HIGH (ref <1.0)

## 2020-04-04 LAB — FERRITIN: Ferritin: 1184 ng/mL — ABNORMAL HIGH (ref 24–336)

## 2020-04-04 LAB — MRSA PCR SCREENING: MRSA by PCR: NEGATIVE

## 2020-04-04 LAB — D-DIMER, QUANTITATIVE: D-Dimer, Quant: 2.22 ug/mL-FEU — ABNORMAL HIGH (ref 0.00–0.50)

## 2020-04-04 MED ORDER — SODIUM CHLORIDE 0.9 % IV SOLN
2.0000 g | Freq: Three times a day (TID) | INTRAVENOUS | Status: DC
  Administered 2020-04-04: 2 g via INTRAVENOUS
  Filled 2020-04-04: qty 2

## 2020-04-04 MED ORDER — IOHEXOL 350 MG/ML SOLN
75.0000 mL | Freq: Once | INTRAVENOUS | Status: AC | PRN
  Administered 2020-04-04: 75 mL via INTRAVENOUS

## 2020-04-04 MED ORDER — DILTIAZEM LOAD VIA INFUSION
20.0000 mg | Freq: Once | INTRAVENOUS | Status: AC
  Administered 2020-04-04: 20 mg via INTRAVENOUS
  Filled 2020-04-04: qty 20

## 2020-04-04 MED ORDER — TRAMADOL HCL 50 MG PO TABS
50.0000 mg | ORAL_TABLET | Freq: Four times a day (QID) | ORAL | Status: DC | PRN
  Administered 2020-04-04 – 2020-04-06 (×3): 50 mg via ORAL
  Filled 2020-04-04 (×3): qty 1

## 2020-04-04 MED ORDER — METOPROLOL SUCCINATE ER 25 MG PO TB24
25.0000 mg | ORAL_TABLET | Freq: Two times a day (BID) | ORAL | Status: DC
  Administered 2020-04-04 – 2020-04-05 (×2): 25 mg via ORAL
  Filled 2020-04-04 (×3): qty 1

## 2020-04-04 MED ORDER — GADOBUTROL 1 MMOL/ML IV SOLN
9.0000 mL | Freq: Once | INTRAVENOUS | Status: AC | PRN
  Administered 2020-04-04: 9 mL via INTRAVENOUS

## 2020-04-04 MED ORDER — VANCOMYCIN HCL 1250 MG/250ML IV SOLN
1250.0000 mg | Freq: Two times a day (BID) | INTRAVENOUS | Status: DC
  Administered 2020-04-04: 1250 mg via INTRAVENOUS
  Filled 2020-04-04 (×2): qty 250

## 2020-04-04 MED ORDER — ALBUMIN HUMAN 25 % IV SOLN
25.0000 g | Freq: Once | INTRAVENOUS | Status: AC
  Administered 2020-04-04: 25 g via INTRAVENOUS
  Filled 2020-04-04: qty 100

## 2020-04-04 MED ORDER — METOPROLOL TARTRATE 5 MG/5ML IV SOLN
5.0000 mg | Freq: Four times a day (QID) | INTRAVENOUS | Status: DC | PRN
  Administered 2020-04-04 – 2020-04-06 (×3): 5 mg via INTRAVENOUS
  Administered 2020-04-06: 1 mg via INTRAVENOUS
  Filled 2020-04-04 (×3): qty 5

## 2020-04-04 MED ORDER — GUAIFENESIN-DM 100-10 MG/5ML PO SYRP: 5.0000 mL | ORAL_SOLUTION | ORAL | Status: DC | PRN

## 2020-04-04 MED ORDER — HYDROMORPHONE HCL 1 MG/ML IJ SOLN
0.5000 mg | INTRAMUSCULAR | Status: DC | PRN
  Administered 2020-04-04: 0.5 mg via INTRAVENOUS
  Filled 2020-04-04: qty 0.5

## 2020-04-04 MED ORDER — METOPROLOL TARTRATE 5 MG/5ML IV SOLN
2.5000 mg | Freq: Four times a day (QID) | INTRAVENOUS | Status: DC | PRN
  Filled 2020-04-04: qty 5

## 2020-04-04 MED ORDER — HYDROCOD POLST-CPM POLST ER 10-8 MG/5ML PO SUER: 5.0000 mL | Freq: Two times a day (BID) | ORAL | Status: DC | PRN

## 2020-04-04 MED ORDER — OXYCODONE HCL 5 MG PO TABS
5.0000 mg | ORAL_TABLET | ORAL | Status: DC | PRN
  Administered 2020-04-05 (×2): 5 mg via ORAL
  Filled 2020-04-04 (×2): qty 1

## 2020-04-04 MED ORDER — METOPROLOL TARTRATE 5 MG/5ML IV SOLN
2.5000 mg | Freq: Once | INTRAVENOUS | Status: AC
  Administered 2020-04-04: 2.5 mg via INTRAVENOUS
  Filled 2020-04-04: qty 5

## 2020-04-04 MED ORDER — DILTIAZEM HCL-DEXTROSE 125-5 MG/125ML-% IV SOLN (PREMIX)
5.0000 mg/h | INTRAVENOUS | Status: DC
  Administered 2020-04-04: 5 mg/h via INTRAVENOUS
  Filled 2020-04-04: qty 125

## 2020-04-04 MED ORDER — DILTIAZEM HCL-DEXTROSE 125-5 MG/125ML-% IV SOLN (PREMIX)
5.0000 mg/h | INTRAVENOUS | Status: DC
  Administered 2020-04-04 – 2020-04-05 (×2): 15 mg/h via INTRAVENOUS
  Filled 2020-04-04 (×5): qty 125

## 2020-04-04 MED ORDER — SENNOSIDES-DOCUSATE SODIUM 8.6-50 MG PO TABS
2.0000 | ORAL_TABLET | Freq: Two times a day (BID) | ORAL | Status: DC
  Administered 2020-04-04 – 2020-04-07 (×3): 2 via ORAL
  Filled 2020-04-04 (×6): qty 2

## 2020-04-04 MED ORDER — SODIUM CHLORIDE 0.9 % IV SOLN
2.0000 g | INTRAVENOUS | Status: AC
  Administered 2020-04-04 – 2020-04-08 (×5): 2 g via INTRAVENOUS
  Filled 2020-04-04 (×5): qty 20

## 2020-04-04 MED ORDER — SODIUM CHLORIDE 0.9 % IV BOLUS
1000.0000 mL | Freq: Once | INTRAVENOUS | Status: AC
  Administered 2020-04-04: 1000 mL via INTRAVENOUS

## 2020-04-04 NOTE — Progress Notes (Addendum)
   04/03/20 2330  Assess: MEWS Score  Temp 99.6 F (37.6 C)  BP 106/79  Pulse Rate (!) 168  Resp 19  Level of Consciousness Alert  SpO2 96 %  O2 Device Room Air  Assess: MEWS Score  MEWS Temp 0  MEWS Systolic 0  MEWS Pulse 3  MEWS RR 0  MEWS LOC 0  MEWS Score 3  MEWS Score Color Yellow  Assess: if the MEWS score is Yellow or Red  Were vital signs taken at a resting state? Yes  Focused Assessment No change from prior assessment  Early Detection of Sepsis Score *See Row Information* Low  MEWS guidelines implemented *See Row Information* Yes  Treat  MEWS Interventions Administered scheduled meds/treatments  Pain Scale 0-10  Pain Score 0  Complains of  (no complaints)  Take Vital Signs  Increase Vital Sign Frequency  Yellow: Q 2hr X 2 then Q 4hr X 2, if remains yellow, continue Q 4hrs  Escalate  MEWS: Escalate Yellow: discuss with charge nurse/RN and consider discussing with provider and RRT  Notify: Charge Nurse/RN  Name of Charge Nurse/RN Notified Lovell Sheehan  Date Charge Nurse/RN Notified 03/31/20  Time Charge Nurse/RN Notified 2330  Notify: Provider  Provider Name/Title Dr. Margo Aye  Date Provider Notified 04/03/20  Time Provider Notified 2330 (Dr. present at bedside)  Notification Type Face-to-face  Notification Reason Other (Comment) (initial assessment-tachy)  Response See new orders  Date of Provider Response 04/03/20  Time of Provider Response 2330  Notify: Rapid Response  Name of Rapid Response RN Notified Onalee Hua (Present at time)  Date Rapid Response Notified 04/03/20  Time Rapid Response Notified 2330  Document  Patient Outcome Other (Comment) (new medications ordered, pt transferring d/t COVID+)  Progress note created (see row info) Yes    Patient arrived to floor as a direct admit. Patient sustaining a 160-170 heart rate. Dr. Margo Aye ordered EKG along with medications. Dr. Margo Aye present during initial assessment and aware of everything. Medications  provided, will continue to assess patient.

## 2020-04-04 NOTE — Consult Note (Signed)
Cardiology Consultation:   Patient ID: Caleb [name] MRN: [id number]; DOB: November 06, 1960  Admit date: 04/03/2020 Date of Consult: 04/04/2020  Primary Care Provider: Lavada MesiHilts, Michael, MD [facility] Cardiologist: No primary care provider on file. new [facility] Electrophysiologist:  None new   Patient Profile:   Caleb ClockFreddie L Awbrey is a 59 y.o. male with a hx of DM who is being seen today for the evaluation of atrial flutter at the request of Dr. Robb Matarrtiz.  History of Present Illness:   Mr. Katrinka BlazingSmith was admitted with leg pain and found to have evidence of infection and ultimately osteo of the spine/discitis. He was noted to be in atrial flutter with a RVR. He has not had palpitations, chest pain or sob. He is otherwise healthy. He had back pain for 4 weeks prior to admit. He has been treated with IV cardizem.    History reviewed. No pertinent past medical history.  Past Surgical History:  Procedure Laterality Date  . KNEE SURGERY    . ROTATOR CUFF REPAIR Right      Home Medications:  Prior to Admission medications   Medication Sig Start Date End Date Taking? Authorizing Provider  Blood Glucose Monitoring Suppl (TRUE METRIX AIR GLUCOSE METER) DEVI Check CBG BID 03/11/20   Hilts, Michael, MD  colchicine 0.6 MG tablet Take 0.6 mg by mouth 2 (two) times daily as needed. 02/29/20   [provider]  metFORMIN (GLUCOPHAGE) 500 MG tablet Take 1 tablet (500 mg total) by mouth 2 (two) times daily with a meal. 03/14/20   Hilts, Michael, MD    Inpatient Medications: Scheduled Meds: . enoxaparin (LOVENOX) injection  40 mg Subcutaneous Q24H  . insulin aspart  0-9 Units Subcutaneous TID WC  . senna-docusate  2 tablet Oral BID   Continuous Infusions: . ceFEPime (MAXIPIME) IV 2 g (04/04/20 0958)  . diltiazem (CARDIZEM) infusion 15 mg/hr (04/04/20 1014)  . lactated ringers 10 mL/hr at 04/04/20 0947  . vancomycin     PRN Meds: chlorpheniramine-HYDROcodone, guaiFENesin-dextromethorphan,  HYDROmorphone (DILAUDID) injection, metoprolol tartrate, oxyCODONE, traMADol  Allergies:   No Known Allergies  Social History:   Social History   Socioeconomic History  . Marital status: Legally Separated    Spouse name: Not on file  . Number of children: Not on file  . Years of education: Not on file  . Highest education level: Not on file  Occupational History  . Not on file  Tobacco Use  . Smoking status: Never Smoker  . Smokeless tobacco: Never Used  Substance and Sexual Activity  . Alcohol use: Not Currently  . Drug use: Never  . Sexual activity: Not on file  Other Topics Concern  . Not on file  Social History Narrative  . Not on file   Social Determinants of Health   Financial Resource Strain:   . Difficulty of Paying Living Expenses: Not on file  Food Insecurity:   . Worried About Programme researcher, broadcasting/film/videounning Out of Food in the Last Year: Not on file  . Ran Out of Food in the Last Year: Not on file  Transportation Needs:   . Lack of Transportation (Medical): Not on file  . Lack of Transportation (Non-Medical): Not on file  Physical Activity:   . Days of Exercise per Week: Not on file  . Minutes of Exercise per Session: Not on file  Stress:   . Feeling of Stress : Not on file  Social Connections:   . Frequency of Communication with Friends and Family: Not on  file  . Frequency of Social Gatherings with Friends and Family: Not on file  . Attends Religious Services: Not on file  . Active Member of Clubs or Organizations: Not on file  . Attends Banker Meetings: Not on file  . Marital Status: Not on file  Intimate Partner Violence:   . Fear of Current or Ex-Partner: Not on file  . Emotionally Abused: Not on file  . Physically Abused: Not on file  . Sexually Abused: Not on file    Family History:    Family History  Problem Relation Age of Onset  . Cancer Father   . Lung cancer Father   . Healthy Sister   . Diabetes Neg Hx   . Heart attack Neg Hx   . Gout Neg  Hx      ROS:  Please see the history of present illness.   All other ROS reviewed and negative.     Physical Exam/Data:   Vitals:   04/04/20 0745 04/04/20 0849 04/04/20 0946 04/04/20 1100  BP: 102/68 101/75 99/82 (!) 85/61  Pulse: (!) 167 (!) 166 (!) 165 97  Resp: 18 18 16 16   Temp: 97.9 F (36.6 C) 98.2 F (36.8 C) 98.2 F (36.8 C) 98.5 F (36.9 C)  TempSrc: Oral Oral Oral Oral  SpO2: 91% 94% 93% 93%  Weight:      Height:        Intake/Output Summary (Last 24 hours) at 04/04/2020 1141 Last data filed at 04/04/2020 0700 Gross per 24 hour  Intake --  Output 975 ml  Net -975 ml   Last 3 Weights 04/03/2020 03/11/2020 03/06/2020  Weight (lbs) 213 lb 225 lb 12.8 oz 233 lb  Weight (kg) 96.616 kg 102.422 kg 105.688 kg     Body mass index is 28.1 kg/m.  General:  Well nourished, well developed, in no acute distress HEENT: normal Lymph: no adenopathy Neck: no JVD Endocrine:  No thryomegaly Vascular: No carotid bruits; FA pulses 2+ bilaterally without bruits  Cardiac:  normal S1, S2; IRRR; no murmur  Lungs:  clear to auscultation bilaterally, no wheezing, rhonchi or rales  Abd: soft, nontender, no hepatomegaly  Ext: no edema Musculoskeletal:  No deformities, BUE and BLE strength normal and equal Skin: warm and dry  Neuro:  CNs 2-12 intact, no focal abnormalities noted Psych:  Normal affect   EKG:  The EKG was personally reviewed and demonstrates:  Atrial flutter with 2:1 AV conduction Telemetry:  Telemetry was personally reviewed and demonstrates:  Atrial flutter with a controlled VR  Relevant CV Studies: Echo pending  Laboratory Data:  High Sensitivity Troponin:  No results for input(s): TROPONINIHS in the last 720 hours.   Chemistry Recent Labs  Lab 04/03/20 2322 04/04/20 0115  NA 132* 133*  K 3.4* 3.6  CL 100 98  CO2 20* 21*  GLUCOSE 138* 130*  BUN 14 13  CREATININE 0.75 0.78  CALCIUM 8.4* 8.3*  GFRNONAA >60 >60  GFRAA >60 >60  ANIONGAP 12 14     Recent Labs  Lab 04/03/20 2322  PROT 7.2  ALBUMIN 2.5*  AST 89*  ALT 88*  ALKPHOS 119  BILITOT 0.9   Hematology Recent Labs  Lab 04/03/20 2322 04/04/20 0115  WBC 5.2 5.4  RBC 4.44 4.59  HGB 13.1 13.6  HCT 39.3 40.6  MCV 88.5 88.5  MCH 29.5 29.6  MCHC 33.3 33.5  RDW 12.7 12.8  PLT 180 209   BNPNo results for input(s): BNP, PROBNP  in the last 168 hours.  DDimer  Recent Labs  Lab 04/04/20 0737  DDIMER 2.22*     Radiology/Studies:  CT ANGIO CHEST PE W OR WO CONTRAST  Result Date: 04/04/2020 CLINICAL DATA:  Persistent tachycardia.  COVID positive EXAM: CT ANGIOGRAPHY CHEST WITH CONTRAST TECHNIQUE: Multidetector CT imaging of the chest was performed using the standard protocol during bolus administration of intravenous contrast. Multiplanar CT image reconstructions and MIPs were obtained to evaluate the vascular anatomy. CONTRAST:  19mL OMNIPAQUE IOHEXOL 350 MG/ML SOLN COMPARISON:  None. FINDINGS: Cardiovascular: Satisfactory opacification of the pulmonary arteries to the segmental level. No evidence of pulmonary embolism. Normal heart size. No pericardial effusion. Mediastinum/Nodes: Negative for adenopathy or mass Lungs/Pleura: Generalized airway thickening with small volume secretions in the right lower lobe bronchus. Patchy ground-glass opacity that is asymmetric to the right but seen in all lobes. No septal thickening, significant effusion, or air leak. Upper Abdomen: Partially covered peripherally calcified cystic lesion in the spleen that is most consistent with pseudocyst. Reference dedicated abdominal imaging. Musculoskeletal: Spondylosis with multilevel thoracic ankylosis. Review of the MIP images confirms the above findings. IMPRESSION: 1. Patchy ground-glass airspace disease correlating with history of COVID-19. 2. Negative for pulmonary embolism. Electronically Signed   By: Marnee Spring M.D.   On: 04/04/2020 11:30   CT ABDOMEN PELVIS W CONTRAST  Result Date:  04/03/2020 CLINICAL DATA:  59 year old male with history of bilateral inguinal pain for the past 3 weeks. EXAM: CT ABDOMEN AND PELVIS WITH CONTRAST TECHNIQUE: Multidetector CT imaging of the abdomen and pelvis was performed using the standard protocol following bolus administration of intravenous contrast. CONTRAST:  ISOVUE-300 IOPAMIDOL (ISOVUE-300) INJECTION 61% COMPARISON:  No priors. FINDINGS: Lower chest: Patchy areas of ground-glass attenuation are noted in the visualized lung bases, most severe in the right lower lobe. Hepatobiliary: Diffuse low attenuation throughout the hepatic parenchyma, indicative of hepatic steatosis. Liver has a slightly shrunken appearance and nodular contour, indicative of underlying cirrhosis. No suspicious cystic or solid hepatic lesions. No intra or extrahepatic biliary ductal dilatation. Gallbladder is normal in appearance. Pancreas: No pancreatic mass. No pancreatic ductal dilatation. No pancreatic or peripancreatic fluid collections or inflammatory changes. Spleen: Large centrally low-intermediate attenuation peripherally calcified lesion in the spleen, likely sequela of remote trauma. Adrenals/Urinary Tract: Subcentimeter low-attenuation lesion in the upper pole the right kidney, too small to characterize, but statistically likely to represent a tiny cyst. Left kidney and bilateral adrenal glands are normal in appearance. No hydroureteronephrosis. Urinary bladder is normal in appearance. Stomach/Bowel: Normal appearance of the stomach. No pathologic dilatation of small bowel or colon. A few scattered colonic diverticulae are noted, without surrounding inflammatory changes to suggest an acute diverticulitis at this time. Normal appendix. Vascular/Lymphatic: Aortic atherosclerosis, without evidence of aneurysm or dissection in the abdominal or pelvic vasculature. No lymphadenopathy noted in the abdomen or pelvis. Reproductive: Prostate gland and seminal vesicles are  unremarkable in appearance. Other: No significant volume of ascites. No pneumoperitoneum. No inguinal hernias are identified. Musculoskeletal: There are destructive changes in the inferior endplate of L3 and superior endplate of L4 with expansion of the disc at the L3-L4 interspace and peripheral enhancement of the disc material which is expanded into the adjacent soft tissues. Small lucent lesion also noted in the L3 spinous process posteriorly (sagittal image 108 of series 8). IMPRESSION: 1. Findings are highly concerning for L3-L4 discitis/osteomyelitis. Further evaluation with MRI of the lumbar spine with and without IV gadolinium is recommended to better evaluate these findings.  2. Small lucent lesion in the L3 spinous process is nonspecific, potentially a hemangioma. Attention at time of forthcoming MRI is recommended. 3. Patchy areas of ground-glass attenuation in the right lung base, most evident in the right lower lobe. Clinical correlation for signs and symptoms of bronchopneumonia is recommended. 4. Hepatic steatosis with morphologic changes in the liver suggesting early cirrhosis. 5. Colonic diverticulosis without evidence of acute diverticulitis at this time. 6. Aortic atherosclerosis. These results will be called to the ordering clinician or representative by the Radiologist Assistant, and communication documented in the PACS or [employer]. Electronically Signed   By: Trudie Reed M.D.   On: 04/03/2020 16:33    Assessment and Plan:   1. Atrial flutter with a RVR - continue IV cardizem, transitioning to oral cardizem and toprol. I will dose. 2. Coags - his CHADSVASC score is one. Prior to catheter ablation, I would suggest he be placed on an OAC. If plan is not to undergo surgery, you can start eliquis 5 mg bid tomorrow. If any chance that he will need back surgery, then hold off on Eliquis.  3. Disp. - I will see him backin the office in several weeks.   For questions or updates,  please contact [facility] Please consult [email or web address] for contact info under    Signed, Lewayne Bunting, MD  04/04/2020 11:41 AM

## 2020-04-04 NOTE — Progress Notes (Signed)
Pharmacy Antibiotic Note  Caleb [name] is a 59 y.o. male admitted on 04/03/2020 with discitis.  Pharmacy has been consulted for Vancomycin/Cefepime dosing. WBC WNL. Renal function good.   Plan: Vancomycin 2000 mg IV x 1 already given, followed by 1250 mg IV q12h Cefepime 2g IV q8h Trend WBC, temp, renal function  F/U infectious work-up Drug levels as indicated   Height: 6\' 1"  (185.4 cm) Weight: 96.6 kg (213 lb) IBW/kg (Calculated) : 79.9  Temp (24hrs), Avg:99.1 F (37.3 C), Min:98.6 F (37 C), Max:99.6 F (37.6 C)  Recent Labs  Lab 04/03/20 2322  WBC 5.2  CREATININE 0.75    Estimated Creatinine Clearance: 121.8 mL/min (by C-G formula based on SCr of 0.75 mg/dL).    No Known Allergies   04/05/20, PharmD, BCPS Clinical Pharmacist Phone: [phone]

## 2020-04-04 NOTE — Progress Notes (Signed)
   04/04/20 0542  Assess: MEWS Score  Temp 98.6 F (37 C)  BP 99/84  Pulse Rate (!) 166  ECG Heart Rate (!) 165  Resp 20  Level of Consciousness Alert  SpO2 94 %  O2 Device Room Air  Patient Activity (if Appropriate) In bed  Assess: MEWS Score  MEWS Temp 0  MEWS Systolic 1  MEWS Pulse 3  MEWS RR 0  MEWS LOC 0  MEWS Score 4  MEWS Score Color Red  Assess: if the MEWS score is Yellow or Red  Were vital signs taken at a resting state? Yes  Focused Assessment No change from prior assessment  Early Detection of Sepsis Score *See Row Information* Low  MEWS guidelines implemented *See Row Information* Yes  Treat  MEWS Interventions Other (Comment) (See transfer assessment. MD notified by Charge RN)  Pain Scale 0-10  Pain Score 0  Take Vital Signs  Increase Vital Sign Frequency  Red: Q 1hr X 4 then Q 4hr X 4, if remains red, continue Q 4hrs  Escalate  MEWS: Escalate Red: discuss with charge nurse/RN and provider, consider discussing with RRT  Notify: Charge Nurse/RN  Name of Charge Nurse/RN Notified Nikki, RN  Date Charge Nurse/RN Notified 04/04/20  Time Charge Nurse/RN Notified 0547  Notify: Provider  Provider Name/Title Dr. Antionette Char  Date Provider Notified 04/04/20  Time Provider Notified [phone]  Notification Type Page  Notification Reason Other (Comment) (Concerns about patient B/P)  Response See new orders  Date of Provider Response 04/04/20  Time of Provider Response [phone]  Document  Patient Outcome Other (Comment) (Patient is stable.)  Progress note created (see row info) Yes   Attempted vasovagal maneuevers with patient per conversation with Dr. Antionette Char (patient blew into straw and was also asked to bear down). Vasovagal maneuevers were unsuccessful. New orders to be completed. Will continue to monitor.

## 2020-04-04 NOTE — Progress Notes (Signed)
Pt arrived to the unit direct admit. Heart rate sustaining in the 160's-170's. Dr Margo Aye made aware, and present during admission. Stat EKG done. Rapid response had been notified and came to assess. Covid PCR came back positive. Dr. placed new orders, medications given. Currently trying to transfer pt. off unit due to covid and cardiac issues. Will continue to monitor pt.

## 2020-04-04 NOTE — Progress Notes (Signed)
Covid-19 screening test positive.  Transferred to Covid unit 5W from 5N.  New SVT, on IV lopressor PRN with parameters, started cardizem drip.  Inflammatory markers, TSH, and CTA PE are pending.

## 2020-04-04 NOTE — Plan of Care (Signed)
  Problem: Coping: Goal: Psychosocial and spiritual needs will be supported Outcome: Progressing   Problem: Respiratory: Goal: Will maintain a patent airway Outcome: Progressing   Problem: Education: Goal: Knowledge of General Education information will improve Description: Including pain rating scale, medication(s)/side effects and non-pharmacologic comfort measures Outcome: Progressing   

## 2020-04-04 NOTE — Progress Notes (Signed)
PHARMACY - PHYSICIAN COMMUNICATION CRITICAL VALUE ALERT - BLOOD CULTURE IDENTIFICATION (BCID)  Caleb [name] is an 59 y.o. male who presented to [facility] on 04/03/2020 with a chief complaint of back pain, malaise, and weight loss.   Assessment:  Presented with back pain, CT scan finding L3-L4 discitis and osteomyelitis - awaiting MRI. WBC 5.4, CRP 7, LA 1. Found to have asymptomatic COVID infection. BCx showing 4/4 with GPC in gram stain - BCID strep species.   Name of physician (or Provider) Contacted: Dr David Stall  Current antibiotics: Cefepime, vancomycin, and metronidazole  Changes to prescribed antibiotics recommended:  Change abx to Ceftriaxone 2g IV every 24 hours   Results for orders placed or performed during the hospital encounter of 04/03/20  Blood Culture ID Panel (Reflexed) (Collected: 04/03/2020 11:15 PM)  Result Value Ref Range   Enterococcus faecalis NOT DETECTED NOT DETECTED   Enterococcus Faecium NOT DETECTED NOT DETECTED   Listeria monocytogenes NOT DETECTED NOT DETECTED   Staphylococcus species NOT DETECTED NOT DETECTED   Staphylococcus aureus (BCID) NOT DETECTED NOT DETECTED   Staphylococcus epidermidis NOT DETECTED NOT DETECTED   Staphylococcus lugdunensis NOT DETECTED NOT DETECTED   Streptococcus species DETECTED (A) NOT DETECTED   Streptococcus agalactiae NOT DETECTED NOT DETECTED   Streptococcus pneumoniae NOT DETECTED NOT DETECTED   Streptococcus pyogenes NOT DETECTED NOT DETECTED   A.calcoaceticus-baumannii NOT DETECTED NOT DETECTED   Bacteroides fragilis NOT DETECTED NOT DETECTED   Enterobacterales NOT DETECTED NOT DETECTED   Enterobacter cloacae complex NOT DETECTED NOT DETECTED   Escherichia coli NOT DETECTED NOT DETECTED   Klebsiella aerogenes NOT DETECTED NOT DETECTED   Klebsiella oxytoca NOT DETECTED NOT DETECTED   Klebsiella pneumoniae NOT DETECTED NOT DETECTED   Proteus species NOT DETECTED NOT DETECTED   Salmonella species NOT DETECTED NOT  DETECTED   Serratia marcescens NOT DETECTED NOT DETECTED   Haemophilus influenzae NOT DETECTED NOT DETECTED   Neisseria meningitidis NOT DETECTED NOT DETECTED   Pseudomonas aeruginosa NOT DETECTED NOT DETECTED   Stenotrophomonas maltophilia NOT DETECTED NOT DETECTED   Candida albicans NOT DETECTED NOT DETECTED   Candida auris NOT DETECTED NOT DETECTED   Candida glabrata NOT DETECTED NOT DETECTED   Candida krusei NOT DETECTED NOT DETECTED   Candida parapsilosis NOT DETECTED NOT DETECTED   Candida tropicalis NOT DETECTED NOT DETECTED   Cryptococcus neoformans/gattii NOT DETECTED NOT DETECTED    Sherron Monday, PharmD, BCCCP Clinical Pharmacist  Phone: [phone] 04/04/2020 6:14 PM  Please check AMION for all [facility] Pharmacy phone numbers After 10:00 PM, call Main Pharmacy [phone]

## 2020-04-04 NOTE — Progress Notes (Addendum)
[facility] PROGRESS NOTE    Progress Note  Caleb ClockFreddie L Yamaguchi  ZOX:[id number] DOB: 1961/06/26 DOA: 04/03/2020 PCP: Lavada MesiHilts, Michael, MD     Brief Narrative:   Caleb [name] is an 59 y.o. male past medical history of diabetes mellitus type 2, gout, who has not been vaccinated against covid-19 who was seen at the [facility] on the day of presentation due to back pain fourth that started 4 weeks prior to admission.  He received steroid injection with minimal improvement in his pain.  CT scan of the abdomen was concerning for L3-L4 discitis and osteomyelitis.  Assessment/Plan:   Sepsis new L3-L4 discitis osteomyelitis present on admission: CT scan of the abdomen pelvis show possible L3-L4 discitis osteomyelitis done on 04/03/2020. MRI of the lumbar spine is pending. Culture data has been sent.  The admitting physician ordered a CT angio of the chest to rule out a PE.  D-dimer will be high as he has 2 active infection there is likely to be low yield for PE. He was started on IV vancomycin, Flagyl and cefepime. Neurology recommended medical management.  Asymptomatic COVID-19 infection: He is afebrile, inflammatory markers are elevated but this could be due to his discitis osteomyelitis, he is satting greater 94% on room air. We will continue to follow him closely.  Possible typical atrial flutter: Started on IV diltiazem drip and bolus, continue metoprolol IV as needed.  Diabetes mellitus type 2 with hyperglycemia: With an A1c of 8.7. Oral hypoglycemic agents have been held.  History of gout: No flares.   DVT prophylaxis: lovenox Family Communication:none Status is: Inpatient  Remains inpatient appropriate because:Hemodynamically unstable   Dispo:  Patient From: Home  Planned Disposition: Home  Expected discharge date: 04/10/20  Medically stable for discharge: No    Code Status:     Code Status Orders  (From admission, onward)         Start      Ordered   04/03/20 2149  Full code  Continuous        04/03/20 2150        Code Status History    This patient has a current code status but no historical code status.   Advance Care Planning Activity        IV Access:    Peripheral IV   Procedures and diagnostic studies:   CT ABDOMEN PELVIS W CONTRAST  Result Date: 04/03/2020 CLINICAL DATA:  59 year old male with history of bilateral inguinal pain for the past 3 weeks. EXAM: CT ABDOMEN AND PELVIS WITH CONTRAST TECHNIQUE: Multidetector CT imaging of the abdomen and pelvis was performed using the standard protocol following bolus administration of intravenous contrast. CONTRAST:  100mL ISOVUE-300 IOPAMIDOL (ISOVUE-300) INJECTION 61% COMPARISON:  No priors. FINDINGS: Lower chest: Patchy areas of ground-glass attenuation are noted in the visualized lung bases, most severe in the right lower lobe. Hepatobiliary: Diffuse low attenuation throughout the hepatic parenchyma, indicative of hepatic steatosis. Liver has a slightly shrunken appearance and nodular contour, indicative of underlying cirrhosis. No suspicious cystic or solid hepatic lesions. No intra or extrahepatic biliary ductal dilatation. Gallbladder is normal in appearance. Pancreas: No pancreatic mass. No pancreatic ductal dilatation. No pancreatic or peripancreatic fluid collections or inflammatory changes. Spleen: Large centrally low-intermediate attenuation peripherally calcified lesion in the spleen, likely sequela of remote trauma. Adrenals/Urinary Tract: Subcentimeter low-attenuation lesion in the upper pole the right kidney, too small to characterize, but statistically likely to represent a tiny cyst. Left kidney and bilateral adrenal  glands are normal in appearance. No hydroureteronephrosis. Urinary bladder is normal in appearance. Stomach/Bowel: Normal appearance of the stomach. No pathologic dilatation of small bowel or colon. A few scattered colonic diverticulae are noted,  without surrounding inflammatory changes to suggest an acute diverticulitis at this time. Normal appendix. Vascular/Lymphatic: Aortic atherosclerosis, without evidence of aneurysm or dissection in the abdominal or pelvic vasculature. No lymphadenopathy noted in the abdomen or pelvis. Reproductive: Prostate gland and seminal vesicles are unremarkable in appearance. Other: No significant volume of ascites. No pneumoperitoneum. No inguinal hernias are identified. Musculoskeletal: There are destructive changes in the inferior endplate of L3 and superior endplate of L4 with expansion of the disc at the L3-L4 interspace and peripheral enhancement of the disc material which is expanded into the adjacent soft tissues. Small lucent lesion also noted in the L3 spinous process posteriorly (sagittal image 108 of series 8). IMPRESSION: 1. Findings are highly concerning for L3-L4 discitis/osteomyelitis. Further evaluation with MRI of the lumbar spine with and without IV gadolinium is recommended to better evaluate these findings. 2. Small lucent lesion in the L3 spinous process is nonspecific, potentially a hemangioma. Attention at time of forthcoming MRI is recommended. 3. Patchy areas of ground-glass attenuation in the right lung base, most evident in the right lower lobe. Clinical correlation for signs and symptoms of bronchopneumonia is recommended. 4. Hepatic steatosis with morphologic changes in the liver suggesting early cirrhosis. 5. Colonic diverticulosis without evidence of acute diverticulitis at this time. 6. Aortic atherosclerosis. These results will be called to the ordering clinician or representative by the Radiologist Assistant, and communication documented in the PACS or [employer]. Electronically Signed   By: Trudie Reed M.D.   On: 04/03/2020 16:33     Medical Consultants:    None.  Anti-Infectives:   IV Vanc and cefpeime  Subjective:    Caleb Clock he relates he continues to have  back pain.  Objective:    Vitals:   04/04/20 0647 04/04/20 0700 04/04/20 0745 04/04/20 0849  BP: (!) 89/80 103/80 102/68 101/75  Pulse: (!) 167  (!) 167 (!) 166  Resp: 20 17 18 18   Temp: 98.1 F (36.7 C)  97.9 F (36.6 C) 98.2 F (36.8 C)  TempSrc: Oral  Oral Oral  SpO2: 91%  91% 94%  Weight:      Height:       SpO2: 94 %   Intake/Output Summary (Last 24 hours) at 04/04/2020 0931 Last data filed at 04/04/2020 0700 Gross per 24 hour  Intake --  Output 975 ml  Net -975 ml   Filed Weights   04/03/20 2200  Weight: 96.6 kg    Exam: General exam: In no acute distress. Respiratory system: Good air movement and clear to auscultation Cardiovascular system: S1 & S2 heard, RRR. No JVD. Gastrointestinal system: Abdomen is nondistended, soft and nontender.  Extremities: No pedal edema. Skin: No rashes, lesions or ulcers Psychiatry: Judgement and insight appear normal. Mood & affect appropriate.    Data Reviewed:    Labs: Basic Metabolic Panel: Recent Labs  Lab 04/03/20 2322 04/04/20 0115  NA 132* 133*  K 3.4* 3.6  CL 100 98  CO2 20* 21*  GLUCOSE 138* 130*  BUN 14 13  CREATININE 0.75 0.78  CALCIUM 8.4* 8.3*  MG 1.6*  --   PHOS 3.6  --    GFR Estimated Creatinine Clearance: 121.8 mL/min (by C-G formula based on SCr of 0.78 mg/dL). Liver Function Tests: Recent Labs  Lab  04/03/20 2322  AST 89*  ALT 88*  ALKPHOS 119  BILITOT 0.9  PROT 7.2  ALBUMIN 2.5*   No results for input(s): LIPASE, AMYLASE in the last 168 hours. No results for input(s): AMMONIA in the last 168 hours. Coagulation profile No results for input(s): INR, PROTIME in the last 168 hours. COVID-19 Labs  Recent Labs    04/04/20 0737  DDIMER 2.22*  FERRITIN 1,184*  CRP 7.0*    Lab Results  Component Value Date   SARSCOV2NAA POSITIVE (A) 04/03/2020   SARSCOV2NAA NEGATIVE 03/03/2020    CBC: Recent Labs  Lab 04/03/20 2322 04/04/20 0115  WBC 5.2 5.4  NEUTROABS 3.8  --   HGB  13.1 13.6  HCT 39.3 40.6  MCV 88.5 88.5  PLT 180 209   Cardiac Enzymes: No results for input(s): CKTOTAL, CKMB, CKMBINDEX, TROPONINI in the last 168 hours. BNP (last 3 results) No results for input(s): PROBNP in the last 8760 hours. CBG: Recent Labs  Lab 04/04/20 0741  GLUCAP 92   D-Dimer: Recent Labs    04/04/20 0737  DDIMER 2.22*   Hgb A1c: Recent Labs    04/03/20 2322  HGBA1C 8.7*   Lipid Profile: No results for input(s): CHOL, HDL, LDLCALC, TRIG, CHOLHDL, LDLDIRECT in the last 72 hours. Thyroid function studies: Recent Labs    04/04/20 0115  TSH 3.064   Anemia work up: Recent Labs    04/04/20 0737  FERRITIN 1,184*   Sepsis Labs: Recent Labs  Lab 04/03/20 2322 04/04/20 0115 04/04/20 0737  WBC 5.2 5.4  --   LATICACIDVEN  --   --  1.0   Microbiology Recent Results (from the past 240 hour(s))  Respiratory Panel by RT PCR (Flu A&B, Covid) - Nasopharyngeal Swab     Status: Abnormal   Collection Time: 04/03/20 11:37 PM   Specimen: Nasopharyngeal Swab  Result Value Ref Range Status   SARS Coronavirus 2 by RT PCR POSITIVE (A) NEGATIVE Final    Comment: RESULT CALLED TO, READ BACK BY AND VERIFIED WITH: B SCHERER RN 04/04/20 0104 JDW (NOTE) SARS-CoV-2 target nucleic acids are DETECTED.  SARS-CoV-2 RNA is generally detectable in upper respiratory specimens  during the acute phase of infection. Positive results are indicative of the presence of the identified virus, but do not rule out bacterial infection or co-infection with other pathogens not detected by the test. Clinical correlation with patient history and other diagnostic information is necessary to determine patient infection status. The expected result is Negative.  Fact Sheet for Patients:  https://www.moore.com/  Fact Sheet for Healthcare Providers: https://www.young.biz/  This test is not yet approved or cleared by the [place] FDA and  has been  authorized for detection and/or diagnosis of SARS-CoV-2 by FDA under an Emergency Use Authorization (EUA).  This EUA will remain in effect (meaning this test can be used)  for the duration of  the COVID-19 declaration under Section 564(b)(1) of the Act, 21 U.S.C. section 360bbb-3(b)(1), unless the authorization is terminated or revoked sooner.      Influenza A by PCR NEGATIVE NEGATIVE Final   Influenza B by PCR NEGATIVE NEGATIVE Final    Comment: (NOTE) The Xpert Xpress SARS-CoV-2/FLU/RSV assay is intended as an aid in  the diagnosis of influenza from Nasopharyngeal swab specimens and  should not be used as a sole basis for treatment. Nasal washings and  aspirates are unacceptable for Xpert Xpress SARS-CoV-2/FLU/RSV  testing.  Fact Sheet for Patients: https://www.moore.com/  Fact Sheet for Healthcare Providers:  https://www.young.biz/  This test is not yet approved or cleared by the [place] and  has been authorized for detection and/or diagnosis of SARS-CoV-2 by  FDA under an Emergency Use Authorization (EUA). This EUA will remain  in effect (meaning this test can be used) for the duration of the  Covid-19 declaration under Section 564(b)(1) of the Act, 21  U.S.C. section 360bbb-3(b)(1), unless the authorization is  terminated or revoked. Performed at [facility], [address] [place], [place] [address]   MRSA PCR Screening     Status: None   Collection Time: 04/03/20 11:37 PM   Specimen: Nasal Mucosa; Nasopharyngeal  Result Value Ref Range Status   MRSA by PCR NEGATIVE NEGATIVE Final    Comment:        The GeneXpert MRSA Assay (FDA approved for NASAL specimens only), is one component of a comprehensive MRSA colonization surveillance program. It is not intended to diagnose MRSA infection nor to guide or monitor treatment for MRSA infections. Performed at [facility], [address], [place], [place]  [address]      Medications:   . enoxaparin (LOVENOX) injection  40 mg Subcutaneous Q24H  . insulin aspart  0-9 Units Subcutaneous TID WC  . senna-docusate  2 tablet Oral BID   Continuous Infusions: . ceFEPime (MAXIPIME) IV    . diltiazem (CARDIZEM) infusion 5 mg/hr (04/04/20 0812)  . lactated ringers 50 mL/hr at 04/04/20 0130  . metronidazole 500 mg (04/04/20 0139)  . vancomycin        LOS: 1 day   Marinda Elk  [facility]  04/04/2020, 9:31 AM

## 2020-04-05 ENCOUNTER — Inpatient Hospital Stay (HOSPITAL_COMMUNITY): Payer: 59

## 2020-04-05 DIAGNOSIS — Z8679 Personal history of other diseases of the circulatory system: Secondary | ICD-10-CM

## 2020-04-05 DIAGNOSIS — R7881 Bacteremia: Secondary | ICD-10-CM

## 2020-04-05 DIAGNOSIS — I483 Typical atrial flutter: Secondary | ICD-10-CM

## 2020-04-05 LAB — CBC WITH DIFFERENTIAL/PLATELET
Abs Immature Granulocytes: 0.02 10*3/uL (ref 0.00–0.07)
Basophils Absolute: 0 10*3/uL (ref 0.0–0.1)
Basophils Relative: 0 %
Eosinophils Absolute: 0 10*3/uL (ref 0.0–0.5)
Eosinophils Relative: 0 %
HCT: 36.6 % — ABNORMAL LOW (ref 39.0–52.0)
Hemoglobin: 11.9 g/dL — ABNORMAL LOW (ref 13.0–17.0)
Immature Granulocytes: 0 %
Lymphocytes Relative: 19 %
Lymphs Abs: 0.9 10*3/uL (ref 0.7–4.0)
MCH: 29.2 pg (ref 26.0–34.0)
MCHC: 32.5 g/dL (ref 30.0–36.0)
MCV: 89.9 fL (ref 80.0–100.0)
Monocytes Absolute: 0.3 10*3/uL (ref 0.1–1.0)
Monocytes Relative: 7 %
Neutro Abs: 3.4 10*3/uL (ref 1.7–7.7)
Neutrophils Relative %: 74 %
Platelets: 161 10*3/uL (ref 150–400)
RBC: 4.07 MIL/uL — ABNORMAL LOW (ref 4.22–5.81)
RDW: 13.1 % (ref 11.5–15.5)
WBC: 4.5 10*3/uL (ref 4.0–10.5)
nRBC: 0 % (ref 0.0–0.2)

## 2020-04-05 LAB — COMPREHENSIVE METABOLIC PANEL
ALT: 54 U/L — ABNORMAL HIGH (ref 0–44)
AST: 51 U/L — ABNORMAL HIGH (ref 15–41)
Albumin: 2.5 g/dL — ABNORMAL LOW (ref 3.5–5.0)
Alkaline Phosphatase: 103 U/L (ref 38–126)
Anion gap: 9 (ref 5–15)
BUN: 8 mg/dL (ref 6–20)
CO2: 21 mmol/L — ABNORMAL LOW (ref 22–32)
Calcium: 8.2 mg/dL — ABNORMAL LOW (ref 8.9–10.3)
Chloride: 103 mmol/L (ref 98–111)
Creatinine, Ser: 0.7 mg/dL (ref 0.61–1.24)
GFR calc Af Amer: >60 mL/min (ref >60)
GFR calc non Af Amer: >60 mL/min (ref >60)
Glucose, Bld: 94 mg/dL (ref 70–99)
Potassium: 3.4 mmol/L — ABNORMAL LOW (ref 3.5–5.1)
Sodium: 133 mmol/L — ABNORMAL LOW (ref 135–145)
Total Bilirubin: 0.5 mg/dL (ref 0.3–1.2)
Total Protein: 6.6 g/dL (ref 6.5–8.1)

## 2020-04-05 LAB — ECHOCARDIOGRAM LIMITED
Height: 73 in
S' Lateral: 3.8 cm
Weight: 3408 oz

## 2020-04-05 LAB — D-DIMER, QUANTITATIVE: D-Dimer, Quant: 2.08 ug/mL-FEU — ABNORMAL HIGH (ref 0.00–0.50)

## 2020-04-05 LAB — GLUCOSE, CAPILLARY
Glucose-Capillary: 95 mg/dL (ref 70–99)
Glucose-Capillary: 115 mg/dL — ABNORMAL HIGH (ref 70–99)
Glucose-Capillary: 93 mg/dL (ref 70–99)
Glucose-Capillary: 120 mg/dL — ABNORMAL HIGH (ref 70–99)

## 2020-04-05 LAB — FERRITIN: Ferritin: 1196 ng/mL — ABNORMAL HIGH (ref 24–336)

## 2020-04-05 LAB — FIBRINOGEN: Fibrinogen: 650 mg/dL — ABNORMAL HIGH (ref 210–475)

## 2020-04-05 LAB — C-REACTIVE PROTEIN: CRP: 6.8 mg/dL — ABNORMAL HIGH (ref <1.0)

## 2020-04-05 MED ORDER — DILTIAZEM HCL ER COATED BEADS 180 MG PO CP24
360.0000 mg | ORAL_CAPSULE | Freq: Every day | ORAL | Status: DC
  Administered 2020-04-05 – 2020-04-07 (×3): 360 mg via ORAL
  Filled 2020-04-05 (×3): qty 2

## 2020-04-05 MED ORDER — ENSURE ENLIVE PO LIQD
237.0000 mL | Freq: Two times a day (BID) | ORAL | Status: DC
  Administered 2020-04-07 – 2020-04-08 (×2): 237 mL via ORAL
  Filled 2020-04-05: qty 237

## 2020-04-05 NOTE — Progress Notes (Signed)
IR received request for lumbar fluid collection aspirations. Dr. Miles Costain reviewed the MRI images and the fluid collections are too small for IR drainage. Dr. David Stall notified via Epic message.  No IR procedure planned and the IR order will be deleted.  Please contact IR if we can be of further assistance.  Alwyn Ren, [place] [phone] 04/05/2020, 10:14 AM

## 2020-04-05 NOTE — Progress Notes (Signed)
  Echocardiogram 2D Echocardiogram has been performed.  Caleb [name] 04/05/2020, 2:22 PM

## 2020-04-05 NOTE — Progress Notes (Signed)
[facility] PROGRESS NOTE    Progress Note  Caleb [name]  JSE:[id number] DOB: [birth year] DOA: 04/03/2020 PCP: Lavada Mesi, MD     Brief Narrative:   Caleb [name] is an 59 y.o. male past medical history of diabetes mellitus type 2, gout, who has not been vaccinated against covid-19 who was seen at the [facility] renal clinic on the day of presentation due to back pain fourth that started 4 weeks prior to admission.  He received steroid injection with minimal improvement in his pain.  CT scan of the abdomen was concerning for L3-L4 discitis and osteomyelitis.  Assessment/Plan:   Sepsis new L3-L4 discitis osteomyelitis present on admission: CT scan of the abdomen pelvis show possible L3-L4 discitis osteomyelitis done on 04/03/2020. MRI of the lumbar spine showed two abscesses in the psoas and anterior epidural abscess measuring 2 x 0.3 cm at the L4 level. CT angio of the chest is negative for PE. Blood culture grew gram-positive cocci 2 out of 2 blood cultures reflex ID panel shows strep Need to call IR for possible aspiration or drainage. Continue narcotics for pain.  Asymptomatic COVID-19 infection: Continues to remain afebrile with no leukocytosis, satting greater than 94% on room air. We will continue to follow his saturations closely.  Typical atrial flutter: Currently rate controlled on IV diltiazem and IV metoprolol. Anticoagulation for possible drain and aspiration by IR.  Diabetes mellitus type 2 with hyperglycemia: With an A1c of 8.7. Oral hypoglycemic agents have been held.  History of gout: No flares.   DVT prophylaxis: lovenox Family Communication:none Status is: Inpatient  Remains inpatient appropriate because:Hemodynamically unstable   Dispo:  Patient From: Home  Planned Disposition: Home  Expected discharge date: 04/10/20  Medically stable for discharge: No    Code Status:     Code Status Orders  (From admission, onward)          Start     Ordered   04/03/20 2149  Full code  Continuous        04/03/20 2150        Code Status History    This patient has a current code status but no historical code status.   Advance Care Planning Activity        IV Access:    Peripheral IV   Procedures and diagnostic studies:   CT ANGIO CHEST PE W OR WO CONTRAST  Result Date: 04/04/2020 CLINICAL DATA:  Persistent tachycardia.  COVID positive EXAM: CT ANGIOGRAPHY CHEST WITH CONTRAST TECHNIQUE: Multidetector CT imaging of the chest was performed using the standard protocol during bolus administration of intravenous contrast. Multiplanar CT image reconstructions and MIPs were obtained to evaluate the vascular anatomy. CONTRAST:  43mL OMNIPAQUE IOHEXOL 350 MG/ML SOLN COMPARISON:  None. FINDINGS: Cardiovascular: Satisfactory opacification of the pulmonary arteries to the segmental level. No evidence of pulmonary embolism. Normal heart size. No pericardial effusion. Mediastinum/Nodes: Negative for adenopathy or mass Lungs/Pleura: Generalized airway thickening with small volume secretions in the right lower lobe bronchus. Patchy ground-glass opacity that is asymmetric to the right but seen in all lobes. No septal thickening, significant effusion, or air leak. Upper Abdomen: Partially covered peripherally calcified cystic lesion in the spleen that is most consistent with pseudocyst. Reference dedicated abdominal imaging. Musculoskeletal: Spondylosis with multilevel thoracic ankylosis. Review of the MIP images confirms the above findings. IMPRESSION: 1. Patchy ground-glass airspace disease correlating with history of COVID-19. 2. Negative for pulmonary embolism. Electronically Signed   By: Kathrynn Ducking.D.  On: 04/04/2020 11:30   MR Lumbar Spine W Wo Contrast  Result Date: 04/04/2020 CLINICAL DATA:  Low back pain EXAM: MRI LUMBAR SPINE WITHOUT AND WITH CONTRAST TECHNIQUE: Multiplanar and multiecho pulse sequences of the lumbar spine  were obtained without and with intravenous contrast. CONTRAST:  9mL GADAVIST GADOBUTROL 1 MMOL/ML IV SOLN COMPARISON:  04/03/2020 CT abdomen pelvis and prior. FINDINGS: Segmentation:  Standard. Alignment:  Straightening of lordosis. Vertebrae: Sequela of L3-4 discitis osteomyelitis. Diffuse bone marrow edema and mild enhancement involving the L3-4 vertebral bodies. Intra discal edema at the L3-4 level with subjacent endplate irregularities. Minimal to mild L3 vertebral body height loss. Extension of inflammatory phlegmon into the prevertebral space. Peripherally enhancing fluid collections involving the prevertebral space at the T3-4 level (for reference 15:18, 22). The largest measures 1.9 x 1.5 cm on the right communicating with the disc space (15:21). There is lateral extension of phlegmon to involve the bilateral psoas muscles. Small bilateral psoas abscesses measuring up to 5 mm on the right and 8 mm on the left (15:18, 23). Anterior epidural extension of phlegmon with 2.0 x 0.3 cm anterior epidural abscess at the L4 level (13:24, 14:6). Bilateral paraspinal space enhancement without focal fluid collection. No focal signal abnormality to correlate with L3 spinous process lucency seen on recent CT. Conus medullaris and cauda equina: Conus extends to the L1-2 level. Conus and cauda equina appear normal. No abnormal conus or cauda equina enhancement. Disc levels: Multilevel desiccation and mild disc space loss. L1-2: Patent spinal canal and neural foramen. L2-3: Disc bulge and bilateral facet hypertrophy. Patent spinal canal. Mild bilateral neural foraminal narrowing. L3-4: Epidural phlegmon with anterior epidural space abscess discussed above. Mild to moderate canal and neural foraminal narrowing. L4-5: Disc bulge with superimposed central protrusion, ligamentum flavum and bilateral facet hypertrophy. Severe spinal canal, moderate right and mild left neural foraminal narrowing. L5-S1: Disc bulge with central  protrusion and bilateral facet hypertrophy. Patent spinal canal and bilateral neural foramen. Paraspinal and other soft tissues: Discussed above. IMPRESSION: L3-4 discitis osteomyelitis with prevertebral and bilateral psoas abscesses, detailed above. Anterior epidural abscess measuring 2.0 x 0.3 cm at the L4 level. Multilevel spondylosis. Severe spinal canal and moderate right neural foraminal narrowing at the L4-5 level. Mild to moderate L3-4 canal or neural foraminal narrowing. Electronically Signed   By: Stana Buntinghikanele  Emekauwa M.D.   On: 04/04/2020 17:11   CT ABDOMEN PELVIS W CONTRAST  Result Date: 04/03/2020 CLINICAL DATA:  59 year old male with history of bilateral inguinal pain for the past 3 weeks. EXAM: CT ABDOMEN AND PELVIS WITH CONTRAST TECHNIQUE: Multidetector CT imaging of the abdomen and pelvis was performed using the standard protocol following bolus administration of intravenous contrast. CONTRAST:  100mL ISOVUE-300 IOPAMIDOL (ISOVUE-300) INJECTION 61% COMPARISON:  No priors. FINDINGS: Lower chest: Patchy areas of ground-glass attenuation are noted in the visualized lung bases, most severe in the right lower lobe. Hepatobiliary: Diffuse low attenuation throughout the hepatic parenchyma, indicative of hepatic steatosis. Liver has a slightly shrunken appearance and nodular contour, indicative of underlying cirrhosis. No suspicious cystic or solid hepatic lesions. No intra or extrahepatic biliary ductal dilatation. Gallbladder is normal in appearance. Pancreas: No pancreatic mass. No pancreatic ductal dilatation. No pancreatic or peripancreatic fluid collections or inflammatory changes. Spleen: Large centrally low-intermediate attenuation peripherally calcified lesion in the spleen, likely sequela of remote trauma. Adrenals/Urinary Tract: Subcentimeter low-attenuation lesion in the upper pole the right kidney, too small to characterize, but statistically likely to represent a tiny cyst. Left kidney  and  bilateral adrenal glands are normal in appearance. No hydroureteronephrosis. Urinary bladder is normal in appearance. Stomach/Bowel: Normal appearance of the stomach. No pathologic dilatation of small bowel or colon. A few scattered colonic diverticulae are noted, without surrounding inflammatory changes to suggest an acute diverticulitis at this time. Normal appendix. Vascular/Lymphatic: Aortic atherosclerosis, without evidence of aneurysm or dissection in the abdominal or pelvic vasculature. No lymphadenopathy noted in the abdomen or pelvis. Reproductive: Prostate gland and seminal vesicles are unremarkable in appearance. Other: No significant volume of ascites. No pneumoperitoneum. No inguinal hernias are identified. Musculoskeletal: There are destructive changes in the inferior endplate of L3 and superior endplate of L4 with expansion of the disc at the L3-L4 interspace and peripheral enhancement of the disc material which is expanded into the adjacent soft tissues. Small lucent lesion also noted in the L3 spinous process posteriorly (sagittal image 108 of series 8). IMPRESSION: 1. Findings are highly concerning for L3-L4 discitis/osteomyelitis. Further evaluation with MRI of the lumbar spine with and without IV gadolinium is recommended to better evaluate these findings. 2. Small lucent lesion in the L3 spinous process is nonspecific, potentially a hemangioma. Attention at time of forthcoming MRI is recommended. 3. Patchy areas of ground-glass attenuation in the right lung base, most evident in the right lower lobe. Clinical correlation for signs and symptoms of bronchopneumonia is recommended. 4. Hepatic steatosis with morphologic changes in the liver suggesting early cirrhosis. 5. Colonic diverticulosis without evidence of acute diverticulitis at this time. 6. Aortic atherosclerosis. These results will be called to the ordering clinician or representative by the Radiologist Assistant, and communication  documented in the PACS or [employer]. Electronically Signed   By: Trudie Reed M.D.   On: 04/03/2020 16:33     Medical Consultants:    None.  Anti-Infectives:   IV Vanc and cefpeime  Subjective:    Caleb [name] he relates he continues to have back pain.  Objective:    Vitals:   04/04/20 1848 04/04/20 2023 04/05/20 0420 04/05/20 0839  BP: 96/68 92/67 106/70   Pulse:  80 80 (!) 107  Resp: (!) 22 20 20    Temp: 99 F (37.2 C) 98.9 F (37.2 C) 98.6 F (37 C)   TempSrc: Axillary Oral Oral   SpO2: 93% 94% 93%   Weight:      Height:       SpO2: 93 %   Intake/Output Summary (Last 24 hours) at 04/05/2020 0930 Last data filed at 04/05/2020 0845 Gross per 24 hour  Intake 1303.07 ml  Output 1100 ml  Net 203.07 ml   Filed Weights   04/03/20 2200  Weight: 96.6 kg    Exam: General exam: In no acute distress. Respiratory system: Good air movement and clear to auscultation. Cardiovascular system: S1 & S2 heard, RRR. No JVD. Gastrointestinal system: Abdomen is nondistended, soft and nontender.  Extremities: No pedal edema. Skin: No rashes, lesions or ulcers Psychiatry: Judgement and insight appear normal. Mood & affect appropriate.   Data Reviewed:    Labs: Basic Metabolic Panel: Recent Labs  Lab 04/03/20 2322 04/03/20 2322 04/04/20 0115 04/05/20 0401  NA 132*  --  133* 133*  K 3.4*   < > 3.6 3.4*  CL 100  --  98 103  CO2 20*  --  21* 21*  GLUCOSE 138*  --  130* 94  BUN 14  --  13 8  CREATININE 0.75  --  0.78 0.70  CALCIUM 8.4*  --  8.3* 8.2*  MG 1.6*  --   --   --   PHOS 3.6  --   --   --    < > = values in this interval not displayed.   GFR Estimated Creatinine Clearance: 121.8 mL/min (by C-G formula based on SCr of 0.7 mg/dL). Liver Function Tests: Recent Labs  Lab 04/03/20 2322 04/05/20 0401  AST 89* 51*  ALT 88* 54*  ALKPHOS 119 103  BILITOT 0.9 0.5  PROT 7.2 6.6  ALBUMIN 2.5* 2.5*   No results for input(s): LIPASE, AMYLASE  in the last 168 hours. No results for input(s): AMMONIA in the last 168 hours. Coagulation profile No results for input(s): INR, PROTIME in the last 168 hours. COVID-19 Labs  Recent Labs    04/04/20 0737 04/05/20 0401  DDIMER 2.22* 2.08*  FERRITIN 1,184* 1,196*  CRP 7.0* 6.8*    Lab Results  Component Value Date   SARSCOV2NAA POSITIVE (A) 04/03/2020   SARSCOV2NAA NEGATIVE 03/03/2020    CBC: Recent Labs  Lab 04/03/20 2322 04/04/20 0115 04/05/20 0401  WBC 5.2 5.4 4.5  NEUTROABS 3.8  --  3.4  HGB 13.1 13.6 11.9*  HCT 39.3 40.6 36.6*  MCV 88.5 88.5 89.9  PLT 180 209 161   Cardiac Enzymes: No results for input(s): CKTOTAL, CKMB, CKMBINDEX, TROPONINI in the last 168 hours. BNP (last 3 results) No results for input(s): PROBNP in the last 8760 hours. CBG: Recent Labs  Lab 04/04/20 0741 04/04/20 1128 04/04/20 1718 04/04/20 2200 04/05/20 0747  GLUCAP 92 98 99 132* 95   D-Dimer: Recent Labs    04/04/20 0737 04/05/20 0401  DDIMER 2.22* 2.08*   Hgb A1c: Recent Labs    04/03/20 2322  HGBA1C 8.7*   Lipid Profile: No results for input(s): CHOL, HDL, LDLCALC, TRIG, CHOLHDL, LDLDIRECT in the last 72 hours. Thyroid function studies: Recent Labs    04/04/20 0115  TSH 3.064   Anemia work up: Recent Labs    04/04/20 0737 04/05/20 0401  FERRITIN 1,184* 1,196*   Sepsis Labs: Recent Labs  Lab 04/03/20 2322 04/04/20 0115 04/04/20 0737 04/05/20 0401  WBC 5.2 5.4  --  4.5  LATICACIDVEN  --   --  1.0  --    Microbiology Recent Results (from the past 240 hour(s))  Culture, blood (routine x 2)     Status: None (Preliminary result)   Collection Time: 04/03/20 11:15 PM   Specimen: BLOOD  Result Value Ref Range Status   Specimen Description BLOOD LEFT ANTECUBITAL  Final   Special Requests   Final    BOTTLES DRAWN AEROBIC AND ANAEROBIC Blood Culture adequate volume   Culture  Setup Time   Final    GRAM POSITIVE COCCI IN BOTH AEROBIC AND ANAEROBIC  BOTTLES Organism ID to follow CRITICAL RESULT CALLED TO, READ BACK BY AND VERIFIED WITH: Alver Sorrow [id number] MLM Performed at [facility], [address], [place], [place] [address]    Culture GRAM POSITIVE COCCI  Final   Report Status PENDING  Incomplete  Blood Culture ID Panel (Reflexed)     Status: Abnormal   Collection Time: 04/03/20 11:15 PM  Result Value Ref Range Status   Enterococcus faecalis NOT DETECTED NOT DETECTED Final   Enterococcus Faecium NOT DETECTED NOT DETECTED Final   Listeria monocytogenes NOT DETECTED NOT DETECTED Final   Staphylococcus species NOT DETECTED NOT DETECTED Final   Staphylococcus aureus (BCID) NOT DETECTED NOT DETECTED Final   Staphylococcus epidermidis  NOT DETECTED NOT DETECTED Final   Staphylococcus lugdunensis NOT DETECTED NOT DETECTED Final   Streptococcus species DETECTED (A) NOT DETECTED Final    Comment: Not Enterococcus species, Streptococcus agalactiae, Streptococcus pyogenes, or Streptococcus pneumoniae. CRITICAL RESULT CALLED TO, READ BACK BY AND VERIFIED WITH: PHARMD K HURTH [id number] MLM    Streptococcus agalactiae NOT DETECTED NOT DETECTED Final   Streptococcus pneumoniae NOT DETECTED NOT DETECTED Final   Streptococcus pyogenes NOT DETECTED NOT DETECTED Final   A.calcoaceticus-baumannii NOT DETECTED NOT DETECTED Final   Bacteroides fragilis NOT DETECTED NOT DETECTED Final   Enterobacterales NOT DETECTED NOT DETECTED Final   Enterobacter cloacae complex NOT DETECTED NOT DETECTED Final   Escherichia coli NOT DETECTED NOT DETECTED Final   Klebsiella aerogenes NOT DETECTED NOT DETECTED Final   Klebsiella oxytoca NOT DETECTED NOT DETECTED Final   Klebsiella pneumoniae NOT DETECTED NOT DETECTED Final   Proteus species NOT DETECTED NOT DETECTED Final   Salmonella species NOT DETECTED NOT DETECTED Final   Serratia marcescens NOT DETECTED NOT DETECTED Final   Haemophilus influenzae NOT DETECTED NOT DETECTED Final   Neisseria  meningitidis NOT DETECTED NOT DETECTED Final   Pseudomonas aeruginosa NOT DETECTED NOT DETECTED Final   Stenotrophomonas maltophilia NOT DETECTED NOT DETECTED Final   Candida albicans NOT DETECTED NOT DETECTED Final   Candida auris NOT DETECTED NOT DETECTED Final   Candida glabrata NOT DETECTED NOT DETECTED Final   Candida krusei NOT DETECTED NOT DETECTED Final   Candida parapsilosis NOT DETECTED NOT DETECTED Final   Candida tropicalis NOT DETECTED NOT DETECTED Final   Cryptococcus neoformans/gattii NOT DETECTED NOT DETECTED Final    Comment: Performed at [facility], [address], [place], [place] [address]  Respiratory Panel by RT PCR (Flu A&B, Covid) - Nasopharyngeal Swab     Status: Abnormal   Collection Time: 04/03/20 11:37 PM   Specimen: Nasopharyngeal Swab  Result Value Ref Range Status   SARS Coronavirus 2 by RT PCR POSITIVE (A) NEGATIVE Final    Comment: RESULT CALLED TO, READ BACK BY AND VERIFIED WITH: B SCHERER RN 04/04/20 0104 JDW (NOTE) SARS-CoV-2 target nucleic acids are DETECTED.  SARS-CoV-2 RNA is generally detectable in upper respiratory specimens  during the acute phase of infection. Positive results are indicative of the presence of the identified virus, but do not rule out bacterial infection or co-infection with other pathogens not detected by the test. Clinical correlation with patient history and other diagnostic information is necessary to determine patient infection status. The expected result is Negative.  Fact Sheet for Patients:  https://www.moore.com/  Fact Sheet for Healthcare Providers: https://www.young.biz/  This test is not yet approved or cleared by the [place] FDA and  has been authorized for detection and/or diagnosis of SARS-CoV-2 by FDA under an Emergency Use Authorization (EUA).  This EUA will remain in effect (meaning this test can be used)  for the duration of  the COVID-19 declaration  under Section 564(b)(1) of the Act, 21 U.S.C. section 360bbb-3(b)(1), unless the authorization is terminated or revoked sooner.      Influenza A by PCR NEGATIVE NEGATIVE Final   Influenza B by PCR NEGATIVE NEGATIVE Final    Comment: (NOTE) The Xpert Xpress SARS-CoV-2/FLU/RSV assay is intended as an aid in  the diagnosis of influenza from Nasopharyngeal swab specimens and  should not be used as a sole basis for treatment. Nasal washings and  aspirates are unacceptable for Xpert Xpress SARS-CoV-2/FLU/RSV  testing.  Fact Sheet for Patients:  https://www.moore.com/  Fact Sheet for Healthcare Providers: https://www.young.biz/  This test is not yet approved or cleared by the [place] FDA and  has been authorized for detection and/or diagnosis of SARS-CoV-2 by  FDA under an Emergency Use Authorization (EUA). This EUA will remain  in effect (meaning this test can be used) for the duration of the  Covid-19 declaration under Section 564(b)(1) of the Act, 21  U.S.C. section 360bbb-3(b)(1), unless the authorization is  terminated or revoked. Performed at [facility], [address], [place], [place] [address]   MRSA PCR Screening     Status: None   Collection Time: 04/03/20 11:37 PM   Specimen: Nasal Mucosa; Nasopharyngeal  Result Value Ref Range Status   MRSA by PCR NEGATIVE NEGATIVE Final    Comment:        The GeneXpert MRSA Assay (FDA approved for NASAL specimens only), is one component of a comprehensive MRSA colonization surveillance program. It is not intended to diagnose MRSA infection nor to guide or monitor treatment for MRSA infections. Performed at [facility], [address], [place], [place] [address]   Culture, blood (routine x 2)     Status: None (Preliminary result)   Collection Time: 04/03/20 11:38 PM   Specimen: BLOOD  Result Value Ref Range Status   Specimen Description BLOOD RIGHT ANTECUBITAL  Final     Special Requests   Final    BOTTLES DRAWN AEROBIC AND ANAEROBIC Blood Culture adequate volume   Culture  Setup Time   Final    GRAM POSITIVE COCCI IN BOTH AEROBIC AND ANAEROBIC BOTTLES CRITICAL VALUE NOTED.  VALUE IS CONSISTENT WITH PREVIOUSLY REPORTED AND CALLED VALUE. Performed at [facility], [address] [place], [place] [address]    Culture GRAM POSITIVE COCCI  Final   Report Status PENDING  Incomplete     Medications:   . enoxaparin (LOVENOX) injection  40 mg Subcutaneous Q24H  . insulin aspart  0-9 Units Subcutaneous TID WC  . metoprolol succinate  25 mg Oral BID  . senna-docusate  2 tablet Oral BID   Continuous Infusions: . cefTRIAXone (ROCEPHIN)  IV Stopped (04/04/20 2030)  . diltiazem (CARDIZEM) infusion 15 mg/hr (04/05/20 0245)  . lactated ringers 10 mL/hr at 04/04/20 0947      LOS: 2 days   Marinda Elk  [facility]  04/05/2020, 9:30 AM

## 2020-04-05 NOTE — Progress Notes (Signed)
Progress Note  Patient Name: Caleb [name] Date of Encounter: 04/05/2020  [facility] Cardiologist: No primary care provider on file.   Subjective   Feeling well.  Denies palpitations.  Inpatient Medications    Scheduled Meds: . insulin aspart  0-9 Units Subcutaneous TID WC  . metoprolol succinate  25 mg Oral BID  . senna-docusate  2 tablet Oral BID   Continuous Infusions: . cefTRIAXone (ROCEPHIN)  IV Stopped (04/04/20 2030)  . diltiazem (CARDIZEM) infusion 15 mg/hr (04/05/20 0245)   PRN Meds: chlorpheniramine-HYDROcodone, guaiFENesin-dextromethorphan, HYDROmorphone (DILAUDID) injection, metoprolol tartrate, oxyCODONE, traMADol   Vital Signs    Vitals:   04/04/20 1848 04/04/20 2023 04/05/20 0420 04/05/20 0839  BP: 96/68 92/67 106/70   Pulse:  80 80 (!) 107  Resp: (!) 22 20 20    Temp: 99 F (37.2 C) 98.9 F (37.2 C) 98.6 F (37 C)   TempSrc: Axillary Oral Oral   SpO2: 93% 94% 93%   Weight:      Height:        Intake/Output Summary (Last 24 hours) at 04/05/2020 1036 Last data filed at 04/05/2020 0845 Gross per 24 hour  Intake 1303.07 ml  Output 1100 ml  Net 203.07 ml   Last 3 Weights 04/03/2020 03/11/2020 03/06/2020  Weight (lbs) 213 lb 225 lb 12.8 oz 233 lb  Weight (kg) 96.616 kg 102.422 kg 105.688 kg      Telemetry    Atrial flutter.  Rate 80s.  - Personally Reviewed  ECG    04/05/20: Atrial flutter.  Rate 80 bpm.  4:1 AV conduction.  - Personally Reviewed  Physical Exam   VS:  BP 106/70 (BP Location: Right Arm)   Pulse (!) 107   Temp 98.6 F (37 C) (Oral)   Resp 20   Ht 6\' 1"  (1.854 m)   Wt 96.6 kg   SpO2 93%   BMI 28.10 kg/m  , BMI Body mass index is 28.1 kg/m. GENERAL:  Well appearing HEENT: Pupils equal round and reactive, fundi not visualized, oral mucosa unremarkable NECK:  No jugular venous distention, waveform within normal limits, carotid upstroke brisk and symmetric, no bruits LUNGS:  Clear to auscultation bilaterally HEART:   RRR.  PMI not displaced or sustained,S1 and S2 within normal limits, no S3, no S4, no clicks, no rubs, no murmurs ABD:  Flat, positive bowel sounds normal in frequency in pitch, no bruits, no rebound, no guarding, no midline pulsatile mass, no hepatomegaly, no splenomegaly EXT:  2 plus pulses throughout, no edema, no cyanosis no clubbing SKIN:  No rashes no nodules NEURO:  Cranial nerves II through XII grossly intact, motor grossly intact throughout PSYCH:  Cognitively intact, oriented to person place and time   Labs    High Sensitivity Troponin:  No results for input(s): TROPONINIHS in the last 720 hours.    Chemistry Recent Labs  Lab 04/03/20 2322 04/04/20 0115 04/05/20 0401  NA 132* 133* 133*  K 3.4* 3.6 3.4*  CL 100 98 103  CO2 20* 21* 21*  GLUCOSE 138* 130* 94  BUN 14 13 8   CREATININE 0.75 0.78 0.70  CALCIUM 8.4* 8.3* 8.2*  PROT 7.2  --  6.6  ALBUMIN 2.5*  --  2.5*  AST 89*  --  51*  ALT 88*  --  54*  ALKPHOS 119  --  103  BILITOT 0.9  --  0.5  GFRNONAA >60 >60 >60  GFRAA >60 >60 >60  ANIONGAP 12 14 9  Hematology Recent Labs  Lab 04/03/20 2322 04/04/20 0115 04/05/20 0401  WBC 5.2 5.4 4.5  RBC 4.44 4.59 4.07*  HGB 13.1 13.6 11.9*  HCT 39.3 40.6 36.6*  MCV 88.5 88.5 89.9  MCH 29.5 29.6 29.2  MCHC 33.3 33.5 32.5  RDW 12.7 12.8 13.1  PLT 180 209 161    BNPNo results for input(s): BNP, PROBNP in the last 168 hours.   DDimer  Recent Labs  Lab 04/04/20 0737 04/05/20 0401  DDIMER 2.22* 2.08*     Radiology    CT ANGIO CHEST PE W OR WO CONTRAST  Result Date: 04/04/2020 CLINICAL DATA:  Persistent tachycardia.  COVID positive EXAM: CT ANGIOGRAPHY CHEST WITH CONTRAST TECHNIQUE: Multidetector CT imaging of the chest was performed using the standard protocol during bolus administration of intravenous contrast. Multiplanar CT image reconstructions and MIPs were obtained to evaluate the vascular anatomy. CONTRAST:  75mL OMNIPAQUE IOHEXOL 350 MG/ML SOLN  COMPARISON:  None. FINDINGS: Cardiovascular: Satisfactory opacification of the pulmonary arteries to the segmental level. No evidence of pulmonary embolism. Normal heart size. No pericardial effusion. Mediastinum/Nodes: Negative for adenopathy or mass Lungs/Pleura: Generalized airway thickening with small volume secretions in the right lower lobe bronchus. Patchy ground-glass opacity that is asymmetric to the right but seen in all lobes. No septal thickening, significant effusion, or air leak. Upper Abdomen: Partially covered peripherally calcified cystic lesion in the spleen that is most consistent with pseudocyst. Reference dedicated abdominal imaging. Musculoskeletal: Spondylosis with multilevel thoracic ankylosis. Review of the MIP images confirms the above findings. IMPRESSION: 1. Patchy ground-glass airspace disease correlating with history of COVID-19. 2. Negative for pulmonary embolism. Electronically Signed   By: Marnee SpringJonathon  Watts M.D.   On: 04/04/2020 11:30   MR Lumbar Spine W Wo Contrast  Result Date: 04/04/2020 CLINICAL DATA:  Low back pain EXAM: MRI LUMBAR SPINE WITHOUT AND WITH CONTRAST TECHNIQUE: Multiplanar and multiecho pulse sequences of the lumbar spine were obtained without and with intravenous contrast. CONTRAST:  9mL GADAVIST GADOBUTROL 1 MMOL/ML IV SOLN COMPARISON:  04/03/2020 CT abdomen pelvis and prior. FINDINGS: Segmentation:  Standard. Alignment:  Straightening of lordosis. Vertebrae: Sequela of L3-4 discitis osteomyelitis. Diffuse bone marrow edema and mild enhancement involving the L3-4 vertebral bodies. Intra discal edema at the L3-4 level with subjacent endplate irregularities. Minimal to mild L3 vertebral body height loss. Extension of inflammatory phlegmon into the prevertebral space. Peripherally enhancing fluid collections involving the prevertebral space at the T3-4 level (for reference 15:18, 22). The largest measures 1.9 x 1.5 cm on the right communicating with the disc space  (15:21). There is lateral extension of phlegmon to involve the bilateral psoas muscles. Small bilateral psoas abscesses measuring up to 5 mm on the right and 8 mm on the left (15:18, 23). Anterior epidural extension of phlegmon with 2.0 x 0.3 cm anterior epidural abscess at the L4 level (13:24, 14:6). Bilateral paraspinal space enhancement without focal fluid collection. No focal signal abnormality to correlate with L3 spinous process lucency seen on recent CT. Conus medullaris and cauda equina: Conus extends to the L1-2 level. Conus and cauda equina appear normal. No abnormal conus or cauda equina enhancement. Disc levels: Multilevel desiccation and mild disc space loss. L1-2: Patent spinal canal and neural foramen. L2-3: Disc bulge and bilateral facet hypertrophy. Patent spinal canal. Mild bilateral neural foraminal narrowing. L3-4: Epidural phlegmon with anterior epidural space abscess discussed above. Mild to moderate canal and neural foraminal narrowing. L4-5: Disc bulge with superimposed central protrusion, ligamentum flavum and bilateral  facet hypertrophy. Severe spinal canal, moderate right and mild left neural foraminal narrowing. L5-S1: Disc bulge with central protrusion and bilateral facet hypertrophy. Patent spinal canal and bilateral neural foramen. Paraspinal and other soft tissues: Discussed above. IMPRESSION: L3-4 discitis osteomyelitis with prevertebral and bilateral psoas abscesses, detailed above. Anterior epidural abscess measuring 2.0 x 0.3 cm at the L4 level. Multilevel spondylosis. Severe spinal canal and moderate right neural foraminal narrowing at the L4-5 level. Mild to moderate L3-4 canal or neural foraminal narrowing. Electronically Signed   By: Stana Bunting M.D.   On: 04/04/2020 17:11   CT ABDOMEN PELVIS W CONTRAST  Result Date: 04/03/2020 CLINICAL DATA:  59 year old male with history of bilateral inguinal pain for the past 3 weeks. EXAM: CT ABDOMEN AND PELVIS WITH CONTRAST  TECHNIQUE: Multidetector CT imaging of the abdomen and pelvis was performed using the standard protocol following bolus administration of intravenous contrast. CONTRAST:  ISOVUE-300 IOPAMIDOL (ISOVUE-300) INJECTION 61% COMPARISON:  No priors. FINDINGS: Lower chest: Patchy areas of ground-glass attenuation are noted in the visualized lung bases, most severe in the right lower lobe. Hepatobiliary: Diffuse low attenuation throughout the hepatic parenchyma, indicative of hepatic steatosis. Liver has a slightly shrunken appearance and nodular contour, indicative of underlying cirrhosis. No suspicious cystic or solid hepatic lesions. No intra or extrahepatic biliary ductal dilatation. Gallbladder is normal in appearance. Pancreas: No pancreatic mass. No pancreatic ductal dilatation. No pancreatic or peripancreatic fluid collections or inflammatory changes. Spleen: Large centrally low-intermediate attenuation peripherally calcified lesion in the spleen, likely sequela of remote trauma. Adrenals/Urinary Tract: Subcentimeter low-attenuation lesion in the upper pole the right kidney, too small to characterize, but statistically likely to represent a tiny cyst. Left kidney and bilateral adrenal glands are normal in appearance. No hydroureteronephrosis. Urinary bladder is normal in appearance. Stomach/Bowel: Normal appearance of the stomach. No pathologic dilatation of small bowel or colon. A few scattered colonic diverticulae are noted, without surrounding inflammatory changes to suggest an acute diverticulitis at this time. Normal appendix. Vascular/Lymphatic: Aortic atherosclerosis, without evidence of aneurysm or dissection in the abdominal or pelvic vasculature. No lymphadenopathy noted in the abdomen or pelvis. Reproductive: Prostate gland and seminal vesicles are unremarkable in appearance. Other: No significant volume of ascites. No pneumoperitoneum. No inguinal hernias are identified. Musculoskeletal: There are  destructive changes in the inferior endplate of L3 and superior endplate of L4 with expansion of the disc at the L3-L4 interspace and peripheral enhancement of the disc material which is expanded into the adjacent soft tissues. Small lucent lesion also noted in the L3 spinous process posteriorly (sagittal image 108 of series 8). IMPRESSION: 1. Findings are highly concerning for L3-L4 discitis/osteomyelitis. Further evaluation with MRI of the lumbar spine with and without IV gadolinium is recommended to better evaluate these findings. 2. Small lucent lesion in the L3 spinous process is nonspecific, potentially a hemangioma. Attention at time of forthcoming MRI is recommended. 3. Patchy areas of ground-glass attenuation in the right lung base, most evident in the right lower lobe. Clinical correlation for signs and symptoms of bronchopneumonia is recommended. 4. Hepatic steatosis with morphologic changes in the liver suggesting early cirrhosis. 5. Colonic diverticulosis without evidence of acute diverticulitis at this time. 6. Aortic atherosclerosis. These results will be called to the ordering clinician or representative by the Radiologist Assistant, and communication documented in the PACS or [employer]. Electronically Signed   By: Trudie Reed M.D.   On: 04/03/2020 16:33    Cardiac Studies   Echo pending  Patient Profile     59 y.o. male with diabetes admitted with osteomyelitis of the spine/discitis.  Cardiology was consulted for atrial flutter with rapid ventricular response.  Assessment & Plan    # Atrial flutter: Remains in atrial flutter.  Rates are well-controlled. He is on diltiazem at 15mg /hr.  Will switch to 360mg  daily.  Stop diltiazem one hour after giving oral diltiazem.  Start anticoagulation after all procedures are completed.  This patients CHA2DS2-VASc Score and unadjusted Ischemic Stroke Rate (% per year) is equal to 0.6 % stroke rate/year from a score of 1.  Will follow  with Dr. after discharge.   # Discitis:  Blood cultures 2:2 GPC.  Will need IR guided aspiration.  Per primary team.  # COVID-19: Asymptomatic.     For questions or updates, please contact [facility] Please consult [email or web address] for contact info under      Signed, , MD  04/05/2020, 10:36 AM

## 2020-04-05 NOTE — Progress Notes (Signed)
Initial Nutrition Assessment  DOCUMENTATION CODES:   Not applicable  INTERVENTION:  Provide Ensure Enlive po BID, each supplement provides 350 kcal and 20 grams of protein  Encourage adequate PO intake.   NUTRITION DIAGNOSIS:   Increased nutrient needs related to catabolic illness (COVID) as evidenced by estimated needs.  GOAL:   Patient will meet greater than or equal to 90% of their needs  MONITOR:   PO intake, Supplement acceptance, Skin, Weight trends, Labs, I & O's  REASON FOR ASSESSMENT:   Malnutrition Screening Tool    ASSESSMENT:   59 y.o. male past medical history of diabetes mellitus type 2, gout, presents with back pain. Pt with sepsis new L3-L4 discitis osteomyelitis, asymptomatic COVID positive.  Pt unavailable during attempted time of contact. Meal completion has been 80-100%. RD to order nutritional supplements to aid in caloric and protein needs. Unable to complete Nutrition-Focused physical exam at this time. RD working remotely.  Labs and medications reviewed.   Diet Order:   Diet Order            Diet NPO time specified  Diet effective 1000           Diet Heart Room service appropriate? Yes; Fluid consistency: Thin  Diet effective now                 EDUCATION NEEDS:   Not appropriate for education at this time  Skin:  Skin Assessment: Reviewed RN Assessment  Last BM:  9/24  Height:   Ht Readings from Last 1 Encounters:  04/03/20 6\' 1"  (1.854 m)    Weight:   Wt Readings from Last 1 Encounters:  04/03/20 96.6 kg    BMI:  Body mass index is 28.1 kg/m.  Estimated Nutritional Needs:   Kcal:  2200-2400  Protein:  110-120 grams  Fluid:  >/= 2 L/day  04/05/20, MS, RD, LDN RD pager number/after hours weekend pager number on Amion.

## 2020-04-06 DIAGNOSIS — U071 COVID-19: Secondary | ICD-10-CM

## 2020-04-06 DIAGNOSIS — M4645 Discitis, unspecified, thoracolumbar region: Secondary | ICD-10-CM

## 2020-04-06 DIAGNOSIS — R7881 Bacteremia: Secondary | ICD-10-CM

## 2020-04-06 DIAGNOSIS — G061 Intraspinal abscess and granuloma: Secondary | ICD-10-CM

## 2020-04-06 DIAGNOSIS — I4892 Unspecified atrial flutter: Secondary | ICD-10-CM

## 2020-04-06 DIAGNOSIS — B955 Unspecified streptococcus as the cause of diseases classified elsewhere: Secondary | ICD-10-CM

## 2020-04-06 DIAGNOSIS — I483 Typical atrial flutter: Secondary | ICD-10-CM

## 2020-04-06 LAB — CBC WITH DIFFERENTIAL/PLATELET
Abs Immature Granulocytes: 0.02 10*3/uL (ref 0.00–0.07)
Basophils Absolute: 0 10*3/uL (ref 0.0–0.1)
Basophils Relative: 0 %
Eosinophils Absolute: 0 10*3/uL (ref 0.0–0.5)
Eosinophils Relative: 0 %
HCT: 38.8 % — ABNORMAL LOW (ref 39.0–52.0)
Hemoglobin: 12.7 g/dL — ABNORMAL LOW (ref 13.0–17.0)
Immature Granulocytes: 1 %
Lymphocytes Relative: 22 %
Lymphs Abs: 0.9 10*3/uL (ref 0.7–4.0)
MCH: 29.2 pg (ref 26.0–34.0)
MCHC: 32.7 g/dL (ref 30.0–36.0)
MCV: 89.2 fL (ref 80.0–100.0)
Monocytes Absolute: 0.2 10*3/uL (ref 0.1–1.0)
Monocytes Relative: 6 %
Neutro Abs: 2.9 10*3/uL (ref 1.7–7.7)
Neutrophils Relative %: 71 %
Platelets: 161 10*3/uL (ref 150–400)
RBC: 4.35 MIL/uL (ref 4.22–5.81)
RDW: 12.8 % (ref 11.5–15.5)
WBC: 4 10*3/uL (ref 4.0–10.5)
nRBC: 0 % (ref 0.0–0.2)

## 2020-04-06 LAB — COMPREHENSIVE METABOLIC PANEL
ALT: 47 U/L — ABNORMAL HIGH (ref 0–44)
AST: 45 U/L — ABNORMAL HIGH (ref 15–41)
Albumin: 2.4 g/dL — ABNORMAL LOW (ref 3.5–5.0)
Alkaline Phosphatase: 115 U/L (ref 38–126)
Anion gap: 9 (ref 5–15)
BUN: 7 mg/dL (ref 6–20)
CO2: 23 mmol/L (ref 22–32)
Calcium: 8.3 mg/dL — ABNORMAL LOW (ref 8.9–10.3)
Chloride: 103 mmol/L (ref 98–111)
Creatinine, Ser: 0.65 mg/dL (ref 0.61–1.24)
GFR calc Af Amer: >60 mL/min (ref >60)
GFR calc non Af Amer: >60 mL/min (ref >60)
Glucose, Bld: 103 mg/dL — ABNORMAL HIGH (ref 70–99)
Potassium: 3.3 mmol/L — ABNORMAL LOW (ref 3.5–5.1)
Sodium: 135 mmol/L (ref 135–145)
Total Bilirubin: 0.6 mg/dL (ref 0.3–1.2)
Total Protein: 6.8 g/dL (ref 6.5–8.1)

## 2020-04-06 LAB — D-DIMER, QUANTITATIVE: D-Dimer, Quant: 2.24 ug/mL-FEU — ABNORMAL HIGH (ref 0.00–0.50)

## 2020-04-06 LAB — CULTURE, BLOOD (ROUTINE X 2)
Special Requests: ADEQUATE
Special Requests: ADEQUATE

## 2020-04-06 LAB — GLUCOSE, CAPILLARY
Glucose-Capillary: 108 mg/dL — ABNORMAL HIGH (ref 70–99)
Glucose-Capillary: 109 mg/dL — ABNORMAL HIGH (ref 70–99)
Glucose-Capillary: 94 mg/dL (ref 70–99)
Glucose-Capillary: 110 mg/dL — ABNORMAL HIGH (ref 70–99)

## 2020-04-06 LAB — FIBRINOGEN: Fibrinogen: 688 mg/dL — ABNORMAL HIGH (ref 210–475)

## 2020-04-06 LAB — C-REACTIVE PROTEIN: CRP: 8.7 mg/dL — ABNORMAL HIGH (ref <1.0)

## 2020-04-06 LAB — FERRITIN: Ferritin: 1000 ng/mL — ABNORMAL HIGH (ref 24–336)

## 2020-04-06 MED ORDER — POTASSIUM CHLORIDE CRYS ER 20 MEQ PO TBCR
40.0000 meq | EXTENDED_RELEASE_TABLET | Freq: Three times a day (TID) | ORAL | Status: AC
  Administered 2020-04-06 (×3): 40 meq via ORAL
  Filled 2020-04-06 (×3): qty 2

## 2020-04-06 MED ORDER — METOPROLOL TARTRATE 5 MG/5ML IV SOLN
5.0000 mg | Freq: Once | INTRAVENOUS | Status: AC
  Administered 2020-04-06: 5 mg via INTRAVENOUS

## 2020-04-06 MED ORDER — METOPROLOL TARTRATE 50 MG PO TABS
50.0000 mg | ORAL_TABLET | Freq: Four times a day (QID) | ORAL | Status: DC
  Administered 2020-04-06 – 2020-04-07 (×5): 50 mg via ORAL
  Filled 2020-04-06 (×5): qty 1

## 2020-04-06 MED ORDER — MAGNESIUM OXIDE 400 (241.3 MG) MG PO TABS
400.0000 mg | ORAL_TABLET | Freq: Two times a day (BID) | ORAL | Status: AC
  Administered 2020-04-06 (×2): 400 mg via ORAL
  Filled 2020-04-06 (×2): qty 1

## 2020-04-06 MED ORDER — ENOXAPARIN SODIUM 40 MG/0.4ML ~~LOC~~ SOLN
40.0000 mg | SUBCUTANEOUS | Status: DC
  Administered 2020-04-06: 40 mg via SUBCUTANEOUS
  Filled 2020-04-06: qty 0.4

## 2020-04-06 NOTE — Progress Notes (Signed)
   04/06/20 5102  Assess: MEWS Score  Temp 99.2 F (37.3 C)  BP 103/84  Pulse Rate (!) 112  ECG Heart Rate (!) 112  Resp 15  SpO2 95 %  O2 Device Room Air  Assess: MEWS Score  MEWS Temp 0  MEWS Systolic 0  MEWS Pulse 2  MEWS RR 0  MEWS LOC 0  MEWS Score 2  MEWS Score Color Yellow  Assess: if the MEWS score is Yellow or Red  Were vital signs taken at a resting state? Yes  Focused Assessment No change from prior assessment  Early Detection of Sepsis Score *See Row Information* Low  MEWS guidelines implemented *See Row Information* Yes  Treat  MEWS Interventions Administered scheduled meds/treatments;Administered prn meds/treatments   Scheduled Cardizem PO and Metoprolol PO given. PRN 5mg  IV metoprolol given. Pt HR now 79, BP 94/71.

## 2020-04-06 NOTE — Progress Notes (Signed)
[facility] PROGRESS NOTE    Progress Note  Caleb ClockFreddie L Wonnacott  BJY:[id number] DOB: Jun 10, 1961 DOA: 04/03/2020 PCP: Lavada MesiHilts, Michael, MD     Brief Narrative:   Caleb [name] is an 59 y.o. male past medical history of diabetes mellitus type 2, gout, who has not been vaccinated against covid-19 who was seen at the [facility] on the day of presentation due to back pain fourth that started 4 weeks prior to admission.  He received steroid injection with minimal improvement in his pain.  CT scan of the abdomen was concerning for L3-L4 discitis and osteomyelitis.  Assessment/Plan:   Sepsis new L3-L4 discitis osteomyelitis present on admission: CT scan of the abdomen pelvis show possible L3-L4 discitis osteomyelitis done on 04/03/2020. He has remained afebrile with no leukocytosis. MRI of the lumbar spine showed 2 small abscesses in the source around the L4 level.  IR was consulted and relates the abscesses are too small for drainage. Blood culture grew Streptococcus infantarius areas surveillance blood cultures were repeated. Continue narcotics for pain. We will go ahead and consult ID.  Asymptomatic COVID-19 infection: Continues to remain afebrile with no leukocytosis, satting greater than 94% on room air. We will continue to follow his saturations closely.  Typical atrial flutter: Cardiology was consulted, they have switch his diltiazem to oral and metoprolol with IV as needed metoprolol for heart rate greater than 100. Continue to hold anticoagulation in case procedure is needed.  Diabetes mellitus type 2 with hyperglycemia: With an A1c of 8.7. Oral hypoglycemic agents have been held.  History of gout: No flares.   DVT prophylaxis: lovenox Family Communication:none Status is: Inpatient  Remains inpatient appropriate because:Hemodynamically unstable   Dispo:  Patient From: Home  Planned Disposition: Home  Expected discharge date: 04/10/20  Medically stable for  discharge: No    Code Status:     Code Status Orders  (From admission, onward)         Start     Ordered   04/03/20 2149  Full code  Continuous        04/03/20 2150        Code Status History    This patient has a current code status but no historical code status.   Advance Care Planning Activity        IV Access:    Peripheral IV   Procedures and diagnostic studies:   CT ANGIO CHEST PE W OR WO CONTRAST  Result Date: 04/04/2020 CLINICAL DATA:  Persistent tachycardia.  COVID positive EXAM: CT ANGIOGRAPHY CHEST WITH CONTRAST TECHNIQUE: Multidetector CT imaging of the chest was performed using the standard protocol during bolus administration of intravenous contrast. Multiplanar CT image reconstructions and MIPs were obtained to evaluate the vascular anatomy. CONTRAST:  75mL OMNIPAQUE IOHEXOL 350 MG/ML SOLN COMPARISON:  None. FINDINGS: Cardiovascular: Satisfactory opacification of the pulmonary arteries to the segmental level. No evidence of pulmonary embolism. Normal heart size. No pericardial effusion. Mediastinum/Nodes: Negative for adenopathy or mass Lungs/Pleura: Generalized airway thickening with small volume secretions in the right lower lobe bronchus. Patchy ground-glass opacity that is asymmetric to the right but seen in all lobes. No septal thickening, significant effusion, or air leak. Upper Abdomen: Partially covered peripherally calcified cystic lesion in the spleen that is most consistent with pseudocyst. Reference dedicated abdominal imaging. Musculoskeletal: Spondylosis with multilevel thoracic ankylosis. Review of the MIP images confirms the above findings. IMPRESSION: 1. Patchy ground-glass airspace disease correlating with history of COVID-19. 2. Negative for pulmonary embolism.  Electronically Signed   By: Marnee Spring M.D.   On: 04/04/2020 11:30   MR Lumbar Spine W Wo Contrast  Result Date: 04/04/2020 CLINICAL DATA:  Low back pain EXAM: MRI LUMBAR SPINE  WITHOUT AND WITH CONTRAST TECHNIQUE: Multiplanar and multiecho pulse sequences of the lumbar spine were obtained without and with intravenous contrast. CONTRAST:  9mL GADAVIST GADOBUTROL 1 MMOL/ML IV SOLN COMPARISON:  04/03/2020 CT abdomen pelvis and prior. FINDINGS: Segmentation:  Standard. Alignment:  Straightening of lordosis. Vertebrae: Sequela of L3-4 discitis osteomyelitis. Diffuse bone marrow edema and mild enhancement involving the L3-4 vertebral bodies. Intra discal edema at the L3-4 level with subjacent endplate irregularities. Minimal to mild L3 vertebral body height loss. Extension of inflammatory phlegmon into the prevertebral space. Peripherally enhancing fluid collections involving the prevertebral space at the T3-4 level (for reference 15:18, 22). The largest measures 1.9 x 1.5 cm on the right communicating with the disc space (15:21). There is lateral extension of phlegmon to involve the bilateral psoas muscles. Small bilateral psoas abscesses measuring up to 5 mm on the right and 8 mm on the left (15:18, 23). Anterior epidural extension of phlegmon with 2.0 x 0.3 cm anterior epidural abscess at the L4 level (13:24, 14:6). Bilateral paraspinal space enhancement without focal fluid collection. No focal signal abnormality to correlate with L3 spinous process lucency seen on recent CT. Conus medullaris and cauda equina: Conus extends to the L1-2 level. Conus and cauda equina appear normal. No abnormal conus or cauda equina enhancement. Disc levels: Multilevel desiccation and mild disc space loss. L1-2: Patent spinal canal and neural foramen. L2-3: Disc bulge and bilateral facet hypertrophy. Patent spinal canal. Mild bilateral neural foraminal narrowing. L3-4: Epidural phlegmon with anterior epidural space abscess discussed above. Mild to moderate canal and neural foraminal narrowing. L4-5: Disc bulge with superimposed central protrusion, ligamentum flavum and bilateral facet hypertrophy. Severe spinal  canal, moderate right and mild left neural foraminal narrowing. L5-S1: Disc bulge with central protrusion and bilateral facet hypertrophy. Patent spinal canal and bilateral neural foramen. Paraspinal and other soft tissues: Discussed above. IMPRESSION: L3-4 discitis osteomyelitis with prevertebral and bilateral psoas abscesses, detailed above. Anterior epidural abscess measuring 2.0 x 0.3 cm at the L4 level. Multilevel spondylosis. Severe spinal canal and moderate right neural foraminal narrowing at the L4-5 level. Mild to moderate L3-4 canal or neural foraminal narrowing. Electronically Signed   By: Stana Bunting M.D.   On: 04/04/2020 17:11   ECHOCARDIOGRAM LIMITED  Result Date: 04/05/2020    ECHOCARDIOGRAM LIMITED REPORT   Patient Name:   ESTEPHAN GALLARDO Date of Exam: 04/05/2020 Medical Rec #:  [id number]       Height:       73.0 in Accession #:    [id number]      Weight:       213.0 lb Date of Birth:  01-29-1961       BSA:          2.210 m Patient Age:    59 years        BP:           97/63 mmHg Patient Gender: M               HR:           80 bpm. Exam Location:  Inpatient Procedure: Limited Echo, Limited Color Doppler and Cardiac Doppler Indications:    bacteremia 790.7  History:        Patient has no prior history of  Echocardiogram examinations.                 Covid. sepsis; Arrythmias:Atrial Flutter.  Sonographer:    Delcie Roch Referring Phys: [id number] Trystyn Dolley FELIZ ORTIZ IMPRESSIONS  1. Left ventricular ejection fraction, by estimation, is 50 to 55%. The left ventricle has low normal function.  2. Right ventricular systolic function is normal. The right ventricular size is normal. There is normal pulmonary artery systolic pressure. The estimated right ventricular systolic pressure is 26.3 mmHg.  3. The mitral valve is normal in structure. Mild mitral valve regurgitation.  4. The aortic valve is possibly bicuspid. There is mild calcification of the aortic valve. There is severe thickening of the  aortic valve. Mild to moderate aortic valve sclerosis/calcification is present, without any evidence of aortic stenosis.  5. The inferior vena cava is dilated in size with >50% respiratory variability, suggesting right atrial pressure of 8 mmHg. Conclusion(s)/Recommendation(s): No evidence of valvular vegetations on this transthoracic echocardiogram. Would recommend a transesophageal echocardiogram to exclude infective endocarditis if clinically indicated. FINDINGS  Left Ventricle: Left ventricular ejection fraction, by estimation, is 50 to 55%. The left ventricle has low normal function. The left ventricular internal cavity size was normal in size. There is no left ventricular hypertrophy. Left ventricular diastolic function could not be evaluated due to atrial fibrillation. Right Ventricle: The right ventricular size is normal. No increase in right ventricular wall thickness. Right ventricular systolic function is normal. There is normal pulmonary artery systolic pressure. The tricuspid regurgitant velocity is 2.14 m/s, and  with an assumed right atrial pressure of 8 mmHg, the estimated right ventricular systolic pressure is 26.3 mmHg. Left Atrium: Left atrial size was normal in size. Right Atrium: Right atrial size was normal in size. Pericardium: There is no evidence of pericardial effusion. Mitral Valve: The mitral valve is normal in structure. Mild mitral valve regurgitation. Tricuspid Valve: The tricuspid valve is normal in structure. Tricuspid valve regurgitation is trivial. Aortic Valve: The aortic valve is bicuspid. There is mild calcification of the aortic valve. There is severe thickening of the aortic valve. Mild to moderate aortic valve sclerosis/calcification is present, without any evidence of aortic stenosis. Pulmonic Valve: The pulmonic valve was not well visualized. Aorta: The aortic root is normal in size and structure. Venous: The inferior vena cava is dilated in size with greater than 50%  respiratory variability, suggesting right atrial pressure of 8 mmHg. LEFT VENTRICLE PLAX 2D LVIDd:         4.90 cm LVIDs:         3.80 cm LV PW:         0.80 cm LV IVS:        0.70 cm LVOT diam:     2.10 cm LV SV:         47 LV SV Index:   21 LVOT Area:     3.46 cm  IVC IVC diam: 2.20 cm LEFT ATRIUM         Index LA diam:    3.70 cm 1.67 cm/m  AORTIC VALVE LVOT Vmax:   83.60 cm/s LVOT Vmean:  57.400 cm/s LVOT VTI:    0.135 m  AORTA Ao Root diam: 3.20 cm TRICUSPID VALVE TR Peak grad:   18.3 mmHg TR Vmax:        214.00 cm/s  SHUNTS Systemic VTI:  0.14 m Systemic Diam: 2.10 cm Rachelle Hora Croitoru MD Electronically signed by Thurmon Fair MD Signature Date/Time: 04/05/2020/2:46:49 PM    Final  Medical Consultants:    None.  Anti-Infectives:   IV Vanc and cefpeime  Subjective:    JIMI SCHAPPERT relates his pain is controlled.  Objective:    Vitals:   04/06/20 0459 04/06/20 0652 04/06/20 0800 04/06/20 0822  BP: 113/68 113/79 100/70 103/84  Pulse: 79 (!) 165  (!) 112  Resp: 17  16 15   Temp: 98.7 F (37.1 C) 97.9 F (36.6 C)  99.2 F (37.3 C)  TempSrc: Oral Oral  Oral  SpO2: 94% 94%  95%  Weight: 99.6 kg     Height:       SpO2: 95 %   Intake/Output Summary (Last 24 hours) at 04/06/2020 0902 Last data filed at 04/06/2020 0505 Gross per 24 hour  Intake 240 ml  Output 1800 ml  Net -1560 ml   Filed Weights   04/03/20 2200 04/06/20 0459  Weight: 96.6 kg 99.6 kg    Exam: General exam: In no acute distress. Respiratory system: Good air movement and clear to auscultation. Cardiovascular system: S1 & S2 heard, RRR. No JVD. Gastrointestinal system: Abdomen is nondistended, soft and nontender.  Extremities: No pedal edema. Skin: No rashes, lesions or ulcers Psychiatry: Judgement and insight appear normal. Mood & affect appropriate.  Data Reviewed:    Labs: Basic Metabolic Panel: Recent Labs  Lab 04/03/20 2322 04/03/20 2322 04/04/20 0115 04/04/20 0115 04/05/20 0401  04/06/20 0457  NA 132*  --  133*  --  133* 135  K 3.4*   < > 3.6   < > 3.4* 3.3*  CL 100  --  98  --  103 103  CO2 20*  --  21*  --  21* 23  GLUCOSE 138*  --  130*  --  94 103*  BUN 14  --  13  --  8 7  CREATININE 0.75  --  0.78  --  0.70 0.65  CALCIUM 8.4*  --  8.3*  --  8.2* 8.3*  MG 1.6*  --   --   --   --   --   PHOS 3.6  --   --   --   --   --    < > = values in this interval not displayed.   GFR Estimated Creatinine Clearance: 123.5 mL/min (by C-G formula based on SCr of 0.65 mg/dL). Liver Function Tests: Recent Labs  Lab 04/03/20 2322 04/05/20 0401 04/06/20 0457  AST 89* 51* 45*  ALT 88* 54* 47*  ALKPHOS 119 103 115  BILITOT 0.9 0.5 0.6  PROT 7.2 6.6 6.8  ALBUMIN 2.5* 2.5* 2.4*   No results for input(s): LIPASE, AMYLASE in the last 168 hours. No results for input(s): AMMONIA in the last 168 hours. Coagulation profile No results for input(s): INR, PROTIME in the last 168 hours. COVID-19 Labs  Recent Labs    04/04/20 0737 04/05/20 0401 04/06/20 0457  DDIMER 2.22* 2.08* 2.24*  FERRITIN 1,184* 1,196* 1,000*  CRP 7.0* 6.8* 8.7*    Lab Results  Component Value Date   SARSCOV2NAA POSITIVE (A) 04/03/2020   SARSCOV2NAA NEGATIVE 03/03/2020    CBC: Recent Labs  Lab 04/03/20 2322 04/04/20 0115 04/05/20 0401 04/06/20 0457  WBC 5.2 5.4 4.5 4.0  NEUTROABS 3.8  --  3.4 2.9  HGB 13.1 13.6 11.9* 12.7*  HCT 39.3 40.6 36.6* 38.8*  MCV 88.5 88.5 89.9 89.2  PLT 180 209 161 161   Cardiac Enzymes: No results for input(s): CKTOTAL, CKMB, CKMBINDEX, TROPONINI in the last 168  hours. BNP (last 3 results) No results for input(s): PROBNP in the last 8760 hours. CBG: Recent Labs  Lab 04/05/20 0747 04/05/20 1223 04/05/20 1655 04/05/20 2021 04/06/20 0719  GLUCAP 95 115* 93 120* 108*   D-Dimer: Recent Labs    04/05/20 0401 04/06/20 0457  DDIMER 2.08* 2.24*   Hgb A1c: Recent Labs    04/03/20 2322  HGBA1C 8.7*   Lipid Profile: No results for input(s):  CHOL, HDL, LDLCALC, TRIG, CHOLHDL, LDLDIRECT in the last 72 hours. Thyroid function studies: Recent Labs    04/04/20 0115  TSH 3.064   Anemia work up: Recent Labs    04/05/20 0401 04/06/20 0457  FERRITIN 1,196* 1,000*   Sepsis Labs: Recent Labs  Lab 04/03/20 2322 04/04/20 0115 04/04/20 0737 04/05/20 0401 04/06/20 0457  WBC 5.2 5.4  --  4.5 4.0  LATICACIDVEN  --   --  1.0  --   --    Microbiology Recent Results (from the past 240 hour(s))  Culture, blood (routine x 2)     Status: Abnormal   Collection Time: 04/03/20 11:15 PM   Specimen: BLOOD  Result Value Ref Range Status   Specimen Description BLOOD LEFT ANTECUBITAL  Final   Special Requests   Final    BOTTLES DRAWN AEROBIC AND ANAEROBIC Blood Culture adequate volume   Culture  Setup Time   Final    GRAM POSITIVE COCCI IN BOTH AEROBIC AND ANAEROBIC BOTTLES CRITICAL RESULT CALLED TO, READ BACK BY AND VERIFIED WITH: PHARMD K HURTH [id number] MLM    Culture (A)  Final    STREPTOCOCCUS INFANTARIUS SUSCEPTIBILITIES PERFORMED ON PREVIOUS CULTURE WITHIN THE LAST 5 DAYS. Performed at [facility], [address], [place], [place] [address]    Report Status 04/06/2020 FINAL  Final  Blood Culture ID Panel (Reflexed)     Status: Abnormal   Collection Time: 04/03/20 11:15 PM  Result Value Ref Range Status   Enterococcus faecalis NOT DETECTED NOT DETECTED Final   Enterococcus Faecium NOT DETECTED NOT DETECTED Final   Listeria monocytogenes NOT DETECTED NOT DETECTED Final   Staphylococcus species NOT DETECTED NOT DETECTED Final   Staphylococcus aureus (BCID) NOT DETECTED NOT DETECTED Final   Staphylococcus epidermidis NOT DETECTED NOT DETECTED Final   Staphylococcus lugdunensis NOT DETECTED NOT DETECTED Final   Streptococcus species DETECTED (A) NOT DETECTED Final    Comment: Not Enterococcus species, Streptococcus agalactiae, Streptococcus pyogenes, or Streptococcus pneumoniae. CRITICAL RESULT CALLED TO, READ BACK  BY AND VERIFIED WITH: PHARMD K HURTH [id number] 1752 MLM    Streptococcus agalactiae NOT DETECTED NOT DETECTED Final   Streptococcus pneumoniae NOT DETECTED NOT DETECTED Final   Streptococcus pyogenes NOT DETECTED NOT DETECTED Final   A.calcoaceticus-baumannii NOT DETECTED NOT DETECTED Final   Bacteroides fragilis NOT DETECTED NOT DETECTED Final   Enterobacterales NOT DETECTED NOT DETECTED Final   Enterobacter cloacae complex NOT DETECTED NOT DETECTED Final   Escherichia coli NOT DETECTED NOT DETECTED Final   Klebsiella aerogenes NOT DETECTED NOT DETECTED Final   Klebsiella oxytoca NOT DETECTED NOT DETECTED Final   Klebsiella pneumoniae NOT DETECTED NOT DETECTED Final   Proteus species NOT DETECTED NOT DETECTED Final   Salmonella species NOT DETECTED NOT DETECTED Final   Serratia marcescens NOT DETECTED NOT DETECTED Final   Haemophilus influenzae NOT DETECTED NOT DETECTED Final   Neisseria meningitidis NOT DETECTED NOT DETECTED Final   Pseudomonas aeruginosa NOT DETECTED NOT DETECTED Final   Stenotrophomonas maltophilia NOT DETECTED NOT DETECTED Final  Candida albicans NOT DETECTED NOT DETECTED Final   Candida auris NOT DETECTED NOT DETECTED Final   Candida glabrata NOT DETECTED NOT DETECTED Final   Candida krusei NOT DETECTED NOT DETECTED Final   Candida parapsilosis NOT DETECTED NOT DETECTED Final   Candida tropicalis NOT DETECTED NOT DETECTED Final   Cryptococcus neoformans/gattii NOT DETECTED NOT DETECTED Final    Comment: Performed at [facility], [address] [place], [place] [address]  Respiratory Panel by RT PCR (Flu A&B, Covid) - Nasopharyngeal Swab     Status: Abnormal   Collection Time: 04/03/20 11:37 PM   Specimen: Nasopharyngeal Swab  Result Value Ref Range Status   SARS Coronavirus 2 by RT PCR POSITIVE (A) NEGATIVE Final    Comment: RESULT CALLED TO, READ BACK BY AND VERIFIED WITH: B SCHERER RN 04/04/20 0104 JDW (NOTE) SARS-CoV-2 target nucleic acids are  DETECTED.  SARS-CoV-2 RNA is generally detectable in upper respiratory specimens  during the acute phase of infection. Positive results are indicative of the presence of the identified virus, but do not rule out bacterial infection or co-infection with other pathogens not detected by the test. Clinical correlation with patient history and other diagnostic information is necessary to determine patient infection status. The expected result is Negative.  Fact Sheet for Patients:  https://www.moore.com/  Fact Sheet for Healthcare Providers: https://www.young.biz/  This test is not yet approved or cleared by the [place] FDA and  has been authorized for detection and/or diagnosis of SARS-CoV-2 by FDA under an Emergency Use Authorization (EUA).  This EUA will remain in effect (meaning this test can be used)  for the duration of  the COVID-19 declaration under Section 564(b)(1) of the Act, 21 U.S.C. section 360bbb-3(b)(1), unless the authorization is terminated or revoked sooner.      Influenza A by PCR NEGATIVE NEGATIVE Final   Influenza B by PCR NEGATIVE NEGATIVE Final    Comment: (NOTE) The Xpert Xpress SARS-CoV-2/FLU/RSV assay is intended as an aid in  the diagnosis of influenza from Nasopharyngeal swab specimens and  should not be used as a sole basis for treatment. Nasal washings and  aspirates are unacceptable for Xpert Xpress SARS-CoV-2/FLU/RSV  testing.  Fact Sheet for Patients: https://www.moore.com/  Fact Sheet for Healthcare Providers: https://www.young.biz/  This test is not yet approved or cleared by the [place] FDA and  has been authorized for detection and/or diagnosis of SARS-CoV-2 by  FDA under an Emergency Use Authorization (EUA). This EUA will remain  in effect (meaning this test can be used) for the duration of the  Covid-19 declaration under Section 564(b)(1) of the Act,  21  U.S.C. section 360bbb-3(b)(1), unless the authorization is  terminated or revoked. Performed at [facility], [address], [place], [place] [address]   MRSA PCR Screening     Status: None   Collection Time: 04/03/20 11:37 PM   Specimen: Nasal Mucosa; Nasopharyngeal  Result Value Ref Range Status   MRSA by PCR NEGATIVE NEGATIVE Final    Comment:        The GeneXpert MRSA Assay (FDA approved for NASAL specimens only), is one component of a comprehensive MRSA colonization surveillance program. It is not intended to diagnose MRSA infection nor to guide or monitor treatment for MRSA infections. Performed at [facility], [address] [place], [place] [address]   Culture, blood (routine x 2)     Status: Abnormal   Collection Time: 04/03/20 11:38 PM   Specimen:  BLOOD  Result Value Ref Range Status   Specimen Description BLOOD RIGHT ANTECUBITAL  Final   Special Requests   Final    BOTTLES DRAWN AEROBIC AND ANAEROBIC Blood Culture adequate volume   Culture  Setup Time   Final    GRAM POSITIVE COCCI IN BOTH AEROBIC AND ANAEROBIC BOTTLES CRITICAL VALUE NOTED.  VALUE IS CONSISTENT WITH PREVIOUSLY REPORTED AND CALLED VALUE. Performed at [facility], [address] [place], [place] [address]    Culture STREPTOCOCCUS INFANTARIUS (A)  Final   Report Status 04/06/2020 FINAL  Final   Organism ID, Bacteria STREPTOCOCCUS INFANTARIUS  Final      Susceptibility   Streptococcus infantarius - MIC*    PENICILLIN <=0.06 SENSITIVE Sensitive     CEFTRIAXONE <=0.12 SENSITIVE Sensitive     LEVOFLOXACIN 4 INTERMEDIATE Intermediate     VANCOMYCIN 0.5 SENSITIVE Sensitive     * STREPTOCOCCUS INFANTARIUS     Medications:   . diltiazem  360 mg Oral Daily  . feeding supplement (ENSURE ENLIVE)  237 mL Oral BID BM  . insulin aspart  0-9 Units Subcutaneous TID WC  . metoprolol succinate  25 mg Oral BID  . senna-docusate  2 tablet Oral BID   Continuous Infusions: .  cefTRIAXone (ROCEPHIN)  IV 2 g (04/05/20 1812)      LOS: 3 days   Marinda Elk  [facility]  04/06/2020, 9:02 AM

## 2020-04-06 NOTE — Consult Note (Signed)
[facility]  Total days of antibiotics 4        Day 2 ceftriaxone       Reason for Consult:discitis    Referring Physician: feliz-ortiz  Principal Problem:   COVID-19 virus infection Active Problems:   Diskitis   Sepsis (HCC)   Hyponatremia   Osteomyelitis of lumbar spine (HCC)   Typical atrial flutter (HCC)    HPI: Caleb [name] is a 59 y.o. male with 3 week history of low back pain with fever and chills that continued to progress until day of admit. He also noted have a mild productive cough on admit, but denies shortness of breath, not vaccinated, not known to have covid exposure. On admit, found to have covid pneumonia. Work up of back pain including spine MRI which shoulwed L3-4 discitis osteomyleitis with extension of inflammatory phlegmon into the prevertebral space involving pervertebral space of T3-4 that measures 1.9 x 1.5cm. plus some small bilateral posaas abscesses. There is also anterior epidrual extension of phlegmon with 2 x 0.3cm anterior epidural abscess at L4 level. He denies any weakness of lower extremities or loss of bladder or bowel. He denies having any dental work or skin infections to explain occult bacteremia/discitis. His infectious work up revealed blood cx + for strep infantarius. In response to his infection, he has had afib/flutter. TTE showing - AV bicuspid, thickened valve but no overt vegetation. Repeat blood cx drawn today. Cardiology unclear if they will cardiovert afib/flutter. Will try to do medical management. Patient reports his cough has resolved. Not requiring any supplemental oxygen.   Streptococcus infantarius    MIC    CEFTRIAXONE <=0.12 SENS... Sensitive    LEVOFLOXACIN 4 INTERMEDI... Intermediate    PENICILLIN <=0.06 SENS... Sensitive    VANCOMYCIN 0.5 SENSITIVE  Sensitive      Hx of right knee surgery-tendon repair.  Allergies: No Known Allergies  MEDICATIONS: . diltiazem  360 mg Oral Daily  .  enoxaparin (LOVENOX) injection  40 mg Subcutaneous Q24H  . feeding supplement (ENSURE ENLIVE)  237 mL Oral BID BM  . insulin aspart  0-9 Units Subcutaneous TID WC  . magnesium oxide  400 mg Oral BID  . metoprolol tartrate  50 mg Oral Q6H  . potassium chloride  40 mEq Oral TID  . senna-docusate  2 tablet Oral BID    Social History   Tobacco Use  . Smoking status: Never Smoker  . Smokeless tobacco: Never Used  Substance Use Topics  . Alcohol use: Not Currently  . Drug use: Never    Family History  Problem Relation Age of Onset  . Cancer Father   . Lung cancer Father   . Healthy Sister   . Diabetes Neg Hx   . Heart attack Neg Hx   . Gout Neg Hx      Review of Systems  Constitutional: positive for  fever, chills, diaphoresis, activity change, appetite change, fatigue and unexpected weight change.  HENT: Negative for congestion, sore throat, rhinorrhea, sneezing, trouble swallowing and sinus pressure.  Eyes: Negative for photophobia and visual disturbance.  Respiratory: Negative for cough, chest tightness, shortness of breath, wheezing and stridor.  Cardiovascular: Negative for chest pain, palpitations and leg swelling.  Gastrointestinal: Negative for nausea, vomiting, abdominal pain, diarrhea, constipation, blood in stool, abdominal distention and anal bleeding.  Genitourinary: Negative for dysuria, hematuria, flank pain and difficulty urinating.  Musculoskeletal: positive for back pain. Negative for myalgias, , joint swelling, arthralgias and gait  problem.  Skin: Negative for color change, pallor, rash and wound.  Neurological: Negative for dizziness, tremors, weakness and light-headedness.  Hematological: Negative for adenopathy. Does not bruise/bleed easily.  Psychiatric/Behavioral: Negative for behavioral problems, confusion, sleep disturbance, dysphoric mood, decreased concentration and agitation.     OBJECTIVE: Temp:  [97.9 F (36.6 C)-99.2 F (37.3 C)] 97.9 F  (36.6 C) (09/27 1326) Pulse Rate:  [57-165] 80 (09/27 1326) Resp:  [15-20] 20 (09/27 1326) BP: (98-113)/(68-87) 98/73 (09/27 1326) SpO2:  [92 %-95 %] 94 % (09/27 1326) Weight:  [99.6 kg] 99.6 kg (09/27 0459) Physical Exam  Constitutional: He is oriented to person, place, and time. He appears well-developed and well-nourished. No distress.  HENT:  Mouth/Throat: Oropharynx is clear and moist. No oropharyngeal exudate.  Cardiovascular: Normal rate, regular rhythm and normal heart sounds. Exam reveals no gallop and no friction rub.  No murmur heard.  Pulmonary/Chest: Effort normal and breath sounds normal. No respiratory distress. He has no wheezes.  Abdominal: Soft. Bowel sounds are normal. He exhibits no distension. There is no tenderness.  Lymphadenopathy:  He has no cervical adenopathy.  Neurological: He is alert and oriented to person, place, and time. Leg lifts bilaterally without difficulty. 5/5 motor and sensation intact Skin: Skin is warm and dry. No rash noted. No erythema.  Psychiatric: He has a normal mood and affect. His behavior is normal.    LABS: Results for orders placed or performed during the hospital encounter of 04/03/20 (from the past 48 hour(s))  Urine rapid drug screen (hosp performed)     Status: None   Collection Time: 04/04/20  3:03 PM  Result Value Ref Range   Opiates NONE DETECTED NONE DETECTED   Cocaine NONE DETECTED NONE DETECTED   Benzodiazepines NONE DETECTED NONE DETECTED   Amphetamines NONE DETECTED NONE DETECTED   Tetrahydrocannabinol NONE DETECTED NONE DETECTED   Barbiturates NONE DETECTED NONE DETECTED    Comment: (NOTE) DRUG SCREEN FOR MEDICAL PURPOSES ONLY.  IF CONFIRMATION IS NEEDED FOR ANY PURPOSE, NOTIFY LAB WITHIN 5 DAYS.  LOWEST DETECTABLE LIMITS FOR URINE DRUG SCREEN Drug Class                     Cutoff (ng/mL) Amphetamine and metabolites    1000 Barbiturate and metabolites    200 Benzodiazepine                 200 Tricyclics and  metabolites     300 Opiates and metabolites        300 Cocaine and metabolites        300 THC                            50 Performed at [facility], [address] [place], [place] [address]   Glucose, capillary     Status: None   Collection Time: 04/04/20  5:18 PM  Result Value Ref Range   Glucose-Capillary 99 70 - 99 mg/dL    Comment: Glucose reference range applies only to samples taken after fasting for at least 8 hours.  Glucose, capillary     Status: Abnormal   Collection Time: 04/04/20 10:00 PM  Result Value Ref Range   Glucose-Capillary 132 (H) 70 - 99 mg/dL    Comment: Glucose reference range applies only to samples taken after fasting for at least 8 hours.  D-dimer, quantitative (not at [facility])     Status: Abnormal  Collection Time: 04/05/20  4:01 AM  Result Value Ref Range   D-Dimer, Quant 2.08 (H) 0.00 - 0.50 ug/mL-FEU    Comment: (NOTE) At the manufacturer cut-off of 0.50 ug/mL FEU, this assay has been documented to exclude PE with a sensitivity and negative predictive value of 97 to 99%.  At this time, this assay has not been approved by the FDA to exclude DVT/VTE. Results should be correlated with clinical presentation. Performed at [facility], [address], [place], [place] [address]   Fibrinogen     Status: Abnormal   Collection Time: 04/05/20  4:01 AM  Result Value Ref Range   Fibrinogen 650 (H) 210 - 475 mg/dL    Comment: Performed at [facility], [address] [place], [place] [address]  C-reactive protein     Status: Abnormal   Collection Time: 04/05/20  4:01 AM  Result Value Ref Range   CRP 6.8 (H) <1.0 mg/dL    Comment: Performed at [facility], [address], [place], [place] [address]  Ferritin     Status: Abnormal   Collection Time: 04/05/20  4:01 AM  Result Value Ref Range   Ferritin 1,196 (H) 24 - 336 ng/mL    Comment: Performed at [facility], [address], [place], [place] [address]  CBC with  Differential/Platelet     Status: Abnormal   Collection Time: 04/05/20  4:01 AM  Result Value Ref Range   WBC 4.5 4.0 - 10.5 K/uL   RBC 4.07 (L) 4.22 - 5.81 MIL/uL   Hemoglobin 11.9 (L) 13.0 - 17.0 g/dL   HCT 74.7 (L) 39 - 52 %   MCV 89.9 80.0 - 100.0 fL   MCH 29.2 26.0 - 34.0 pg   MCHC 32.5 30.0 - 36.0 g/dL   RDW 34.0 37.0 - 96.4 %   Platelets 161 150 - 400 K/uL   nRBC 0.0 0.0 - 0.2 %   Neutrophils Relative % 74 %   Neutro Abs 3.4 1.7 - 7.7 K/uL   Lymphocytes Relative 19 %   Lymphs Abs 0.9 0.7 - 4.0 K/uL   Monocytes Relative 7 %   Monocytes Absolute 0.3 0 - 1 K/uL   Eosinophils Relative 0 %   Eosinophils Absolute 0.0 0 - 0 K/uL   Basophils Relative 0 %   Basophils Absolute 0.0 0 - 0 K/uL   Immature Granulocytes 0 %   Abs Immature Granulocytes 0.02 0.00 - 0.07 K/uL    Comment: Performed at [facility], [address], [place], [place] [address]  Comprehensive metabolic panel     Status: Abnormal   Collection Time: 04/05/20  4:01 AM  Result Value Ref Range   Sodium 133 (L) 135 - 145 mmol/L   Potassium 3.4 (L) 3.5 - 5.1 mmol/L   Chloride 103 98 - 111 mmol/L   CO2 21 (L) 22 - 32 mmol/L   Glucose, Bld 94 70 - 99 mg/dL    Comment: Glucose reference range applies only to samples taken after fasting for at least 8 hours.   BUN 8 6 - 20 mg/dL   Creatinine, Ser 8.40 0.61 - 1.24 mg/dL   Calcium 8.2 (L) 8.9 - 10.3 mg/dL   Total Protein 6.6 6.5 - 8.1 g/dL   Albumin 2.5 (L) 3.5 - 5.0 g/dL   AST 51 (H) 15 - 41 U/L   ALT 54 (H) 0 - 44 U/L   Alkaline Phosphatase 103 38 -  126 U/L   Total Bilirubin 0.5 0.3 - 1.2 mg/dL   GFR calc non Af Amer >60 >60 mL/min   GFR calc Af Amer >60 >60 mL/min   Anion gap 9 5 - 15    Comment: Performed at [facility], [address], [place], [place] [address]  Glucose, capillary     Status: None   Collection Time: 04/05/20  7:47 AM  Result Value Ref Range   Glucose-Capillary 95 70 - 99 mg/dL    Comment: Glucose reference range applies only  to samples taken after fasting for at least 8 hours.  Glucose, capillary     Status: Abnormal   Collection Time: 04/05/20 12:23 PM  Result Value Ref Range   Glucose-Capillary 115 (H) 70 - 99 mg/dL    Comment: Glucose reference range applies only to samples taken after fasting for at least 8 hours.  Glucose, capillary     Status: None   Collection Time: 04/05/20  4:55 PM  Result Value Ref Range   Glucose-Capillary 93 70 - 99 mg/dL    Comment: Glucose reference range applies only to samples taken after fasting for at least 8 hours.  Glucose, capillary     Status: Abnormal   Collection Time: 04/05/20  8:21 PM  Result Value Ref Range   Glucose-Capillary 120 (H) 70 - 99 mg/dL    Comment: Glucose reference range applies only to samples taken after fasting for at least 8 hours.  D-dimer, quantitative (not at [facility])     Status: Abnormal   Collection Time: 04/06/20  4:57 AM  Result Value Ref Range   D-Dimer, Quant 2.24 (H) 0.00 - 0.50 ug/mL-FEU    Comment: (NOTE) At the manufacturer cut-off of 0.50 ug/mL FEU, this assay has been documented to exclude PE with a sensitivity and negative predictive value of 97 to 99%.  At this time, this assay has not been approved by the FDA to exclude DVT/VTE. Results should be correlated with clinical presentation. Performed at [facility], [address] [place], [place] [address]   Fibrinogen     Status: Abnormal   Collection Time: 04/06/20  4:57 AM  Result Value Ref Range   Fibrinogen 688 (H) 210 - 475 mg/dL    Comment: Performed at [facility], [address] [place], [place] [address]  C-reactive protein     Status: Abnormal   Collection Time: 04/06/20  4:57 AM  Result Value Ref Range   CRP 8.7 (H) <1.0 mg/dL    Comment: Performed at [facility], [address], [place], [place] [address]  Ferritin     Status: Abnormal   Collection Time: 04/06/20  4:57 AM  Result Value Ref Range   Ferritin 1,000 (H) 24 - 336 ng/mL     Comment: Performed at [facility], [address] [place], [place] [address]  CBC with Differential/Platelet     Status: Abnormal   Collection Time: 04/06/20  4:57 AM  Result Value Ref Range   WBC 4.0 4.0 - 10.5 K/uL   RBC 4.35 4.22 - 5.81 MIL/uL   Hemoglobin 12.7 (L) 13.0 - 17.0 g/dL   HCT 67.1 (L) 39 - 52 %   MCV 89.2 80.0 - 100.0 fL   MCH 29.2 26.0 - 34.0 pg   MCHC 32.7 30.0 - 36.0 g/dL   RDW 24.5 80.9 - 98.3 %   Platelets 161 150 - 400 K/uL   nRBC 0.0 0.0 -  0.2 %   Neutrophils Relative % 71 %   Neutro Abs 2.9 1.7 - 7.7 K/uL   Lymphocytes Relative 22 %   Lymphs Abs 0.9 0.7 - 4.0 K/uL   Monocytes Relative 6 %   Monocytes Absolute 0.2 0 - 1 K/uL   Eosinophils Relative 0 %   Eosinophils Absolute 0.0 0 - 0 K/uL   Basophils Relative 0 %   Basophils Absolute 0.0 0 - 0 K/uL   Immature Granulocytes 1 %   Abs Immature Granulocytes 0.02 0.00 - 0.07 K/uL    Comment: Performed at [facility], [address] [place], [place] [address]  Comprehensive metabolic panel     Status: Abnormal   Collection Time: 04/06/20  4:57 AM  Result Value Ref Range   Sodium 135 135 - 145 mmol/L   Potassium 3.3 (L) 3.5 - 5.1 mmol/L   Chloride 103 98 - 111 mmol/L   CO2 23 22 - 32 mmol/L   Glucose, Bld 103 (H) 70 - 99 mg/dL    Comment: Glucose reference range applies only to samples taken after fasting for at least 8 hours.   BUN 7 6 - 20 mg/dL   Creatinine, Ser 2.130.65 0.61 - 1.24 mg/dL   Calcium 8.3 (L) 8.9 - 10.3 mg/dL   Total Protein 6.8 6.5 - 8.1 g/dL   Albumin 2.4 (L) 3.5 - 5.0 g/dL   AST 45 (H) 15 - 41 U/L   ALT 47 (H) 0 - 44 U/L   Alkaline Phosphatase 115 38 - 126 U/L   Total Bilirubin 0.6 0.3 - 1.2 mg/dL   GFR calc non Af Amer >60 >60 mL/min   GFR calc Af Amer >60 >60 mL/min   Anion gap 9 5 - 15    Comment: Performed at [facility], [address] [place], [place] [address]  Glucose, capillary     Status: Abnormal   Collection Time: 04/06/20  7:19 AM  Result Value Ref  Range   Glucose-Capillary 108 (H) 70 - 99 mg/dL    Comment: Glucose reference range applies only to samples taken after fasting for at least 8 hours.  Glucose, capillary     Status: Abnormal   Collection Time: 04/06/20 11:32 AM  Result Value Ref Range   Glucose-Capillary 109 (H) 70 - 99 mg/dL    Comment: Glucose reference range applies only to samples taken after fasting for at least 8 hours.    MICRO: Blood cx 9/24- strep infantarius Blood cx 9/27 blood cx pending IMAGING: MR Lumbar Spine W Wo Contrast  Result Date: 04/04/2020 CLINICAL DATA:  Low back pain EXAM: MRI LUMBAR SPINE WITHOUT AND WITH CONTRAST TECHNIQUE: Multiplanar and multiecho pulse sequences of the lumbar spine were obtained without and with intravenous contrast. CONTRAST:  9mL GADAVIST GADOBUTROL 1 MMOL/ML IV SOLN COMPARISON:  04/03/2020 CT abdomen pelvis and prior. FINDINGS: Segmentation:  Standard. Alignment:  Straightening of lordosis. Vertebrae: Sequela of L3-4 discitis osteomyelitis. Diffuse bone marrow edema and mild enhancement involving the L3-4 vertebral bodies. Intra discal edema at the L3-4 level with subjacent endplate irregularities. Minimal to mild L3 vertebral body height loss. Extension of inflammatory phlegmon into the prevertebral space. Peripherally enhancing fluid collections involving the prevertebral space at the T3-4 level (for reference 15:18, 22). The largest measures 1.9 x 1.5 cm on the right communicating with the disc space (15:21). There is lateral extension of phlegmon to involve the bilateral psoas muscles. Small bilateral psoas abscesses measuring up to 5  mm on the right and 8 mm on the left (15:18, 23). Anterior epidural extension of phlegmon with 2.0 x 0.3 cm anterior epidural abscess at the L4 level (13:24, 14:6). Bilateral paraspinal space enhancement without focal fluid collection. No focal signal abnormality to correlate with L3 spinous process lucency seen on recent CT. Conus medullaris and  cauda equina: Conus extends to the L1-2 level. Conus and cauda equina appear normal. No abnormal conus or cauda equina enhancement. Disc levels: Multilevel desiccation and mild disc space loss. L1-2: Patent spinal canal and neural foramen. L2-3: Disc bulge and bilateral facet hypertrophy. Patent spinal canal. Mild bilateral neural foraminal narrowing. L3-4: Epidural phlegmon with anterior epidural space abscess discussed above. Mild to moderate canal and neural foraminal narrowing. L4-5: Disc bulge with superimposed central protrusion, ligamentum flavum and bilateral facet hypertrophy. Severe spinal canal, moderate right and mild left neural foraminal narrowing. L5-S1: Disc bulge with central protrusion and bilateral facet hypertrophy. Patent spinal canal and bilateral neural foramen. Paraspinal and other soft tissues: Discussed above. IMPRESSION: L3-4 discitis osteomyelitis with prevertebral and bilateral psoas abscesses, detailed above. Anterior epidural abscess measuring 2.0 x 0.3 cm at the L4 level. Multilevel spondylosis. Severe spinal canal and moderate right neural foraminal narrowing at the L4-5 level. Mild to moderate L3-4 canal or neural foraminal narrowing. Electronically Signed   By: Stana Bunting M.D.   On: 04/04/2020 17:11   ECHOCARDIOGRAM LIMITED  Result Date: 04/05/2020    ECHOCARDIOGRAM LIMITED REPORT   Patient Name:   Caleb [name] Date of Exam: 04/05/2020 Medical Rec #:  [id number]       Height:       73.0 in Accession #:    [id number]      Weight:       213.0 lb Date of Birth:  03-May-1961       BSA:          2.210 m Patient Age:    59 years        BP:           97/63 mmHg Patient Gender: M               HR:           80 bpm. Exam Location:  Inpatient Procedure: Limited Echo, Limited Color Doppler and Cardiac Doppler Indications:    bacteremia 790.7  History:        Patient has no prior history of Echocardiogram examinations.                 Covid. sepsis; Arrythmias:Atrial Flutter.   Sonographer:    Delcie Roch Referring Phys: [id number] ABRAHAM FELIZ ORTIZ IMPRESSIONS  1. Left ventricular ejection fraction, by estimation, is 50 to 55%. The left ventricle has low normal function.  2. Right ventricular systolic function is normal. The right ventricular size is normal. There is normal pulmonary artery systolic pressure. The estimated right ventricular systolic pressure is 26.3 mmHg.  3. The mitral valve is normal in structure. Mild mitral valve regurgitation.  4. The aortic valve is possibly bicuspid. There is mild calcification of the aortic valve. There is severe thickening of the aortic valve. Mild to moderate aortic valve sclerosis/calcification is present, without any evidence of aortic stenosis.  5. The inferior vena cava is dilated in size with >50% respiratory variability, suggesting right atrial pressure of 8 mmHg. Conclusion(s)/Recommendation(s): No evidence of valvular vegetations on this transthoracic echocardiogram. Would recommend a transesophageal echocardiogram to exclude infective endocarditis if clinically indicated. FINDINGS  Left Ventricle: Left ventricular ejection fraction, by estimation, is 50 to 55%. The left ventricle has low normal function. The left ventricular internal cavity size was normal in size. There is no left ventricular hypertrophy. Left ventricular diastolic function could not be evaluated due to atrial fibrillation. Right Ventricle: The right ventricular size is normal. No increase in right ventricular wall thickness. Right ventricular systolic function is normal. There is normal pulmonary artery systolic pressure. The tricuspid regurgitant velocity is 2.14 m/s, and  with an assumed right atrial pressure of 8 mmHg, the estimated right ventricular systolic pressure is 26.3 mmHg. Left Atrium: Left atrial size was normal in size. Right Atrium: Right atrial size was normal in size. Pericardium: There is no evidence of pericardial effusion. Mitral Valve: The  mitral valve is normal in structure. Mild mitral valve regurgitation. Tricuspid Valve: The tricuspid valve is normal in structure. Tricuspid valve regurgitation is trivial. Aortic Valve: The aortic valve is bicuspid. There is mild calcification of the aortic valve. There is severe thickening of the aortic valve. Mild to moderate aortic valve sclerosis/calcification is present, without any evidence of aortic stenosis. Pulmonic Valve: The pulmonic valve was not well visualized. Aorta: The aortic root is normal in size and structure. Venous: The inferior vena cava is dilated in size with greater than 50% respiratory variability, suggesting right atrial pressure of 8 mmHg. LEFT VENTRICLE PLAX 2D LVIDd:         4.90 cm LVIDs:         3.80 cm LV PW:         0.80 cm LV IVS:        0.70 cm LVOT diam:     2.10 cm LV SV:         47 LV SV Index:   21 LVOT Area:     3.46 cm  IVC IVC diam: 2.20 cm LEFT ATRIUM         Index LA diam:    3.70 cm 1.67 cm/m  AORTIC VALVE LVOT Vmax:   83.60 cm/s LVOT Vmean:  57.400 cm/s LVOT VTI:    0.135 m  AORTA Ao Root diam: 3.20 cm TRICUSPID VALVE TR Peak grad:   18.3 mmHg TR Vmax:        214.00 cm/s  SHUNTS Systemic VTI:  0.14 m Systemic Diam: 2.10 cm Mihai Croitoru MD Electronically signed by Thurmon Fair MD Signature Date/Time: 04/05/2020/2:46:49 PM    Final     Assessment/Plan: 59yo M back pain and fever/chills x 3 weeks  found to have strep infantarius bacteremia with thoracic discitis and anterior epidural abscess with 2 x 0.3cm anterior epidural abscess at L4 level, also incidentally found to have mild case of covid-19.  covid-19= appears improving. Only needs 10 day of isolation since admit  Streptococcal discitis with anterior epidural abscess- neurologically intact on exam = continue on ceftriaxone 2gm IV daily. Will follow up on blood culture- if remain negative in 48 hrs, then can place picc line. Plan to treat for 6 weeks.   Afib/flutter = thought to be due to underlying  infection.   Streptococcal bacteremia = recommend to get TEE at the end of isolation to evaluate for endocarditis.  Duke Salvia Drue Second MD MPH [facility] [phone]

## 2020-04-06 NOTE — Progress Notes (Signed)
Cardiology Progress Note  Patient ID: Caleb [name] MRN: [id number] DOB: 14-Sep-1960 Date of Encounter: 04/06/2020  Primary Cardiologist: No primary care provider on file.  Subjective   Chief Complaint: None.  HPI: Atrial flutter not rate control this morning.  Heart rate consistently 160.  ROS:  All other ROS reviewed and negative. Pertinent positives noted in the HPI.     Inpatient Medications  Scheduled Meds:  diltiazem  360 mg Oral Daily   feeding supplement (ENSURE ENLIVE)  237 mL Oral BID BM   insulin aspart  0-9 Units Subcutaneous TID WC   metoprolol tartrate  50 mg Oral Q6H   senna-docusate  2 tablet Oral BID   Continuous Infusions:  cefTRIAXone (ROCEPHIN)  IV 2 g (04/05/20 1812)   PRN Meds: chlorpheniramine-HYDROcodone, guaiFENesin-dextromethorphan, HYDROmorphone (DILAUDID) injection, metoprolol tartrate, oxyCODONE, traMADol   Vital Signs   Vitals:   04/06/20 0459 04/06/20 0652 04/06/20 0800 04/06/20 0822  BP: 113/68 113/79 100/70 103/84  Pulse: 79 (!) 165  (!) 112  Resp: 17  16 15   Temp: 98.7 F (37.1 C) 97.9 F (36.6 C)  99.2 F (37.3 C)  TempSrc: Oral Oral  Oral  SpO2: 94% 94%  95%  Weight: 99.6 kg     Height:        Intake/Output Summary (Last 24 hours) at 04/06/2020 0953 Last data filed at 04/06/2020 0505 Gross per 24 hour  Intake 240 ml  Output 1800 ml  Net -1560 ml   Last 3 Weights 04/06/2020 04/03/2020 03/11/2020  Weight (lbs) 219 lb 9.3 oz 213 lb 225 lb 12.8 oz  Weight (kg) 99.6 kg 96.616 kg 102.422 kg      Telemetry  Overnight telemetry shows atrial flutter with 160, which I personally reviewed.   ECG  The most recent ECG shows atrial flutter, which I personally reviewed.   Physical Exam   Vitals:   04/06/20 0459 04/06/20 0652 04/06/20 0800 04/06/20 0822  BP: 113/68 113/79 100/70 103/84  Pulse: 79 (!) 165  (!) 112  Resp: 17  16 15   Temp: 98.7 F (37.1 C) 97.9 F (36.6 C)  99.2 F (37.3 C)  TempSrc: Oral Oral  Oral  SpO2:  94% 94%  95%  Weight: 99.6 kg     Height:         Intake/Output Summary (Last 24 hours) at 04/06/2020 0953 Last data filed at 04/06/2020 0505 Gross per 24 hour  Intake 240 ml  Output 1800 ml  Net -1560 ml    Last 3 Weights 04/06/2020 04/03/2020 03/11/2020  Weight (lbs) 219 lb 9.3 oz 213 lb 225 lb 12.8 oz  Weight (kg) 99.6 kg 96.616 kg 102.422 kg    Body mass index is 28.97 kg/m.  General: Well nourished, well developed, in no acute distress Head: Atraumatic, normal size  Eyes: PEERLA, EOMI  Neck: Supple, no JVD Endocrine: No thryomegaly Cardiac: Normal S1, S2; irregular rhythm, no murmurs rubs or gallops, tachycardia noted Lungs: Clear to auscultation bilaterally, no wheezing, rhonchi or rales  Abd: Soft, nontender, no hepatomegaly  Ext: No edema, pulses 2+ Musculoskeletal: No deformities, BUE and BLE strength normal and equal Skin: Warm and dry, no rashes   Neuro: Alert and oriented to person, place, time, and situation, CNII-XII grossly intact, no focal deficits  Psych: Normal mood and affect   Labs  High Sensitivity Troponin:  No results for input(s): TROPONINIHS in the last 720 hours.   Cardiac EnzymesNo results for input(s): TROPONINI in the last 168 hours.  No results for input(s): TROPIPOC in the last 168 hours.  Chemistry Recent Labs  Lab 04/03/20 2322 04/03/20 2322 04/04/20 0115 04/05/20 0401 04/06/20 0457  NA 132*   < > 133* 133* 135  K 3.4*   < > 3.6 3.4* 3.3*  CL 100   < > 98 103 103  CO2 20*   < > 21* 21* 23  GLUCOSE 138*   < > 130* 94 103*  BUN 14   < > 13 8 7   CREATININE 0.75   < > 0.78 0.70 0.65  CALCIUM 8.4*   < > 8.3* 8.2* 8.3*  PROT 7.2  --   --  6.6 6.8  ALBUMIN 2.5*  --   --  2.5* 2.4*  AST 89*  --   --  51* 45*  ALT 88*  --   --  54* 47*  ALKPHOS 119  --   --  103 115  BILITOT 0.9  --   --  0.5 0.6  GFRNONAA >60   < > >60 >60 >60  GFRAA >60   < > >60 >60 >60  ANIONGAP 12   < > 14 9 9    < > = values in this interval not displayed.      Hematology Recent Labs  Lab 04/04/20 0115 04/05/20 0401 04/06/20 0457  WBC 5.4 4.5 4.0  RBC 4.59 4.07* 4.35  HGB 13.6 11.9* 12.7*  HCT 40.6 36.6* 38.8*  MCV 88.5 89.9 89.2  MCH 29.6 29.2 29.2  MCHC 33.5 32.5 32.7  RDW 12.8 13.1 12.8  PLT 209 161 161   BNPNo results for input(s): BNP, PROBNP in the last 168 hours.  DDimer  Recent Labs  Lab 04/04/20 0737 04/05/20 0401 04/06/20 0457  DDIMER 2.22* 2.08* 2.24*     Radiology  CT ANGIO CHEST PE W OR WO CONTRAST  Result Date: 04/04/2020 CLINICAL DATA:  Persistent tachycardia.  COVID positive EXAM: CT ANGIOGRAPHY CHEST WITH CONTRAST TECHNIQUE: Multidetector CT imaging of the chest was performed using the standard protocol during bolus administration of intravenous contrast. Multiplanar CT image reconstructions and MIPs were obtained to evaluate the vascular anatomy. CONTRAST:  75mL OMNIPAQUE IOHEXOL 350 MG/ML SOLN COMPARISON:  None. FINDINGS: Cardiovascular: Satisfactory opacification of the pulmonary arteries to the segmental level. No evidence of pulmonary embolism. Normal heart size. No pericardial effusion. Mediastinum/Nodes: Negative for adenopathy or mass Lungs/Pleura: Generalized airway thickening with small volume secretions in the right lower lobe bronchus. Patchy ground-glass opacity that is asymmetric to the right but seen in all lobes. No septal thickening, significant effusion, or air leak. Upper Abdomen: Partially covered peripherally calcified cystic lesion in the spleen that is most consistent with pseudocyst. Reference dedicated abdominal imaging. Musculoskeletal: Spondylosis with multilevel thoracic ankylosis. Review of the MIP images confirms the above findings. IMPRESSION: 1. Patchy ground-glass airspace disease correlating with history of COVID-19. 2. Negative for pulmonary embolism. Electronically Signed   By: Marnee SpringJonathon  Watts M.D.   On: 04/04/2020 11:30   MR Lumbar Spine W Wo Contrast  Result Date: 04/04/2020 CLINICAL  DATA:  Low back pain EXAM: MRI LUMBAR SPINE WITHOUT AND WITH CONTRAST TECHNIQUE: Multiplanar and multiecho pulse sequences of the lumbar spine were obtained without and with intravenous contrast. CONTRAST:  9mL GADAVIST GADOBUTROL 1 MMOL/ML IV SOLN COMPARISON:  04/03/2020 CT abdomen pelvis and prior. FINDINGS: Segmentation:  Standard. Alignment:  Straightening of lordosis. Vertebrae: Sequela of L3-4 discitis osteomyelitis. Diffuse bone marrow edema and mild enhancement involving the L3-4 vertebral bodies. Intra  discal edema at the L3-4 level with subjacent endplate irregularities. Minimal to mild L3 vertebral body height loss. Extension of inflammatory phlegmon into the prevertebral space. Peripherally enhancing fluid collections involving the prevertebral space at the T3-4 level (for reference 15:18, 22). The largest measures 1.9 x 1.5 cm on the right communicating with the disc space (15:21). There is lateral extension of phlegmon to involve the bilateral psoas muscles. Small bilateral psoas abscesses measuring up to 5 mm on the right and 8 mm on the left (15:18, 23). Anterior epidural extension of phlegmon with 2.0 x 0.3 cm anterior epidural abscess at the L4 level (13:24, 14:6). Bilateral paraspinal space enhancement without focal fluid collection. No focal signal abnormality to correlate with L3 spinous process lucency seen on recent CT. Conus medullaris and cauda equina: Conus extends to the L1-2 level. Conus and cauda equina appear normal. No abnormal conus or cauda equina enhancement. Disc levels: Multilevel desiccation and mild disc space loss. L1-2: Patent spinal canal and neural foramen. L2-3: Disc bulge and bilateral facet hypertrophy. Patent spinal canal. Mild bilateral neural foraminal narrowing. L3-4: Epidural phlegmon with anterior epidural space abscess discussed above. Mild to moderate canal and neural foraminal narrowing. L4-5: Disc bulge with superimposed central protrusion, ligamentum flavum and  bilateral facet hypertrophy. Severe spinal canal, moderate right and mild left neural foraminal narrowing. L5-S1: Disc bulge with central protrusion and bilateral facet hypertrophy. Patent spinal canal and bilateral neural foramen. Paraspinal and other soft tissues: Discussed above. IMPRESSION: L3-4 discitis osteomyelitis with prevertebral and bilateral psoas abscesses, detailed above. Anterior epidural abscess measuring 2.0 x 0.3 cm at the L4 level. Multilevel spondylosis. Severe spinal canal and moderate right neural foraminal narrowing at the L4-5 level. Mild to moderate L3-4 canal or neural foraminal narrowing. Electronically Signed   By: Stana Bunting M.D.   On: 04/04/2020 17:11   ECHOCARDIOGRAM LIMITED  Result Date: 04/05/2020    ECHOCARDIOGRAM LIMITED REPORT   Patient Name:   Caleb [name] Date of Exam: 04/05/2020 Medical Rec #:  [id number]       Height:       73.0 in Accession #:    [id number]      Weight:       213.0 lb Date of Birth:  1961/04/18       BSA:          2.210 m Patient Age:    59 years        BP:           97/63 mmHg Patient Gender: M               HR:           80 bpm. Exam Location:  Inpatient Procedure: Limited Echo, Limited Color Doppler and Cardiac Doppler Indications:    bacteremia 790.7  History:        Patient has no prior history of Echocardiogram examinations.                 Covid. sepsis; Arrythmias:Atrial Flutter.  Sonographer:    Delcie Roch Referring Phys: [id number] ABRAHAM FELIZ ORTIZ IMPRESSIONS  1. Left ventricular ejection fraction, by estimation, is 50 to 55%. The left ventricle has low normal function.  2. Right ventricular systolic function is normal. The right ventricular size is normal. There is normal pulmonary artery systolic pressure. The estimated right ventricular systolic pressure is 26.3 mmHg.  3. The mitral valve is normal in structure. Mild mitral valve regurgitation.  4. The aortic valve  is possibly bicuspid. There is mild calcification of the aortic  valve. There is severe thickening of the aortic valve. Mild to moderate aortic valve sclerosis/calcification is present, without any evidence of aortic stenosis.  5. The inferior vena cava is dilated in size with >50% respiratory variability, suggesting right atrial pressure of 8 mmHg. Conclusion(s)/Recommendation(s): No evidence of valvular vegetations on this transthoracic echocardiogram. Would recommend a transesophageal echocardiogram to exclude infective endocarditis if clinically indicated. FINDINGS  Left Ventricle: Left ventricular ejection fraction, by estimation, is 50 to 55%. The left ventricle has low normal function. The left ventricular internal cavity size was normal in size. There is no left ventricular hypertrophy. Left ventricular diastolic function could not be evaluated due to atrial fibrillation. Right Ventricle: The right ventricular size is normal. No increase in right ventricular wall thickness. Right ventricular systolic function is normal. There is normal pulmonary artery systolic pressure. The tricuspid regurgitant velocity is 2.14 m/s, and  with an assumed right atrial pressure of 8 mmHg, the estimated right ventricular systolic pressure is 26.3 mmHg. Left Atrium: Left atrial size was normal in size. Right Atrium: Right atrial size was normal in size. Pericardium: There is no evidence of pericardial effusion. Mitral Valve: The mitral valve is normal in structure. Mild mitral valve regurgitation. Tricuspid Valve: The tricuspid valve is normal in structure. Tricuspid valve regurgitation is trivial. Aortic Valve: The aortic valve is bicuspid. There is mild calcification of the aortic valve. There is severe thickening of the aortic valve. Mild to moderate aortic valve sclerosis/calcification is present, without any evidence of aortic stenosis. Pulmonic Valve: The pulmonic valve was not well visualized. Aorta: The aortic root is normal in size and structure. Venous: The inferior vena cava is  dilated in size with greater than 50% respiratory variability, suggesting right atrial pressure of 8 mmHg. LEFT VENTRICLE PLAX 2D LVIDd:         4.90 cm LVIDs:         3.80 cm LV PW:         0.80 cm LV IVS:        0.70 cm LVOT diam:     2.10 cm LV SV:         47 LV SV Index:   21 LVOT Area:     3.46 cm  IVC IVC diam: 2.20 cm LEFT ATRIUM         Index LA diam:    3.70 cm 1.67 cm/m  AORTIC VALVE LVOT Vmax:   83.60 cm/s LVOT Vmean:  57.400 cm/s LVOT VTI:    0.135 m  AORTA Ao Root diam: 3.20 cm TRICUSPID VALVE TR Peak grad:   18.3 mmHg TR Vmax:        214.00 cm/s  SHUNTS Systemic VTI:  0.14 m Systemic Diam: 2.10 cm Thurmon Fair MD Electronically signed by Thurmon Fair MD Signature Date/Time: 04/05/2020/2:46:49 PM    Final     Cardiac Studies  TTE 04/05/2020 1. Left ventricular ejection fraction, by estimation, is 50 to 55%. The  left ventricle has low normal function.  2. Right ventricular systolic function is normal. The right ventricular  size is normal. There is normal pulmonary artery systolic pressure. The  estimated right ventricular systolic pressure is 26.3 mmHg.  3. The mitral valve is normal in structure. Mild mitral valve  regurgitation.  4. The aortic valve is possibly bicuspid. There is mild calcification of  the aortic valve. There is severe thickening of the aortic valve. Mild to  moderate  aortic valve sclerosis/calcification is present, without any  evidence of aortic stenosis.  5. The inferior vena cava is dilated in size with >50% respiratory  variability, suggesting right atrial pressure of 8 mmHg.   Patient Profile  Caleb [name] is a 59 y.o. male with diabetes, gout who was admitted on 04/03/2020 with discitis.  Course has been complicated by atrial flutter and COVID-19 positivity.  Assessment & Plan   1.  New onset atrial flutter -Diltiazem drip was stopped yesterday.  He has been started on diltiazem extended release 360 mg daily.  Rate still not controlled.  I  have changed his metoprolol to 50 mg every 6 hours. -We will continue to titrate up for better rate control. -Holding anticoagulation in case he needs procedure..  We will plan to restart anticoagulation once we can. -CHADSVASC = 1 so will just need anticoagulation around the time of cardioversion.  Clearly we cannot pursue cardioversion at this time.  He is COVID-19 positive.  He also may need procedures.  We can plan for him to follow-up with Dr. Ladona Ridgel for possible TEE/ablation at discharge. -While he is here we will work for better rate control.  2.  Bacteremia -TTE without vegetation.  Suspect he will need TEE.  ID consulted.  TEE will have to be delayed until he is off Covid precautions.  For questions or updates, please contact [facility] Please consult [email or web address] for contact info under   Time Spent with Patient: I have spent a total of 25 minutes with patient reviewing hospital notes, telemetry, EKGs, labs and examining the patient as well as establishing an assessment and plan that was discussed with the patient.  > 50% of time was spent in direct patient care.    Signed, Lenna Gilford. Flora Lipps, MD [facility]   [facility]  04/06/2020 9:53 AM

## 2020-04-06 NOTE — Progress Notes (Signed)
Noted afib/flutter rate 150-160. Metoprolol 1mg  of 5mg  IV PRN given.  BP dropped sysolic 113 down to SBP 100 after 1mg  given. HR remained A.Flutter 150's. MD paged.

## 2020-04-07 LAB — CBC WITH DIFFERENTIAL/PLATELET
Abs Immature Granulocytes: 0.02 10*3/uL (ref 0.00–0.07)
Basophils Absolute: 0 10*3/uL (ref 0.0–0.1)
Basophils Relative: 1 %
Eosinophils Absolute: 0 10*3/uL (ref 0.0–0.5)
Eosinophils Relative: 1 %
HCT: 39.5 % (ref 39.0–52.0)
Hemoglobin: 13.1 g/dL (ref 13.0–17.0)
Immature Granulocytes: 1 %
Lymphocytes Relative: 17 %
Lymphs Abs: 0.7 10*3/uL (ref 0.7–4.0)
MCH: 30 pg (ref 26.0–34.0)
MCHC: 33.2 g/dL (ref 30.0–36.0)
MCV: 90.4 fL (ref 80.0–100.0)
Monocytes Absolute: 0.3 10*3/uL (ref 0.1–1.0)
Monocytes Relative: 7 %
Neutro Abs: 2.8 10*3/uL (ref 1.7–7.7)
Neutrophils Relative %: 73 %
Platelets: 206 10*3/uL (ref 150–400)
RBC: 4.37 MIL/uL (ref 4.22–5.81)
RDW: 13 % (ref 11.5–15.5)
WBC: 3.8 10*3/uL — ABNORMAL LOW (ref 4.0–10.5)
nRBC: 0 % (ref 0.0–0.2)

## 2020-04-07 LAB — COMPREHENSIVE METABOLIC PANEL
ALT: 51 U/L — ABNORMAL HIGH (ref 0–44)
AST: 49 U/L — ABNORMAL HIGH (ref 15–41)
Albumin: 2.3 g/dL — ABNORMAL LOW (ref 3.5–5.0)
Alkaline Phosphatase: 127 U/L — ABNORMAL HIGH (ref 38–126)
Anion gap: 10 (ref 5–15)
BUN: 8 mg/dL (ref 6–20)
CO2: 21 mmol/L — ABNORMAL LOW (ref 22–32)
Calcium: 8.3 mg/dL — ABNORMAL LOW (ref 8.9–10.3)
Chloride: 104 mmol/L (ref 98–111)
Creatinine, Ser: 0.62 mg/dL (ref 0.61–1.24)
GFR calc Af Amer: >60 mL/min (ref >60)
GFR calc non Af Amer: >60 mL/min (ref >60)
Glucose, Bld: 95 mg/dL (ref 70–99)
Potassium: 4.1 mmol/L (ref 3.5–5.1)
Sodium: 135 mmol/L (ref 135–145)
Total Bilirubin: 0.6 mg/dL (ref 0.3–1.2)
Total Protein: 6.7 g/dL (ref 6.5–8.1)

## 2020-04-07 LAB — D-DIMER, QUANTITATIVE: D-Dimer, Quant: 2.21 ug/mL-FEU — ABNORMAL HIGH (ref 0.00–0.50)

## 2020-04-07 LAB — C-REACTIVE PROTEIN: CRP: 8 mg/dL — ABNORMAL HIGH (ref <1.0)

## 2020-04-07 LAB — FERRITIN: Ferritin: 933 ng/mL — ABNORMAL HIGH (ref 24–336)

## 2020-04-07 LAB — GLUCOSE, CAPILLARY
Glucose-Capillary: 95 mg/dL (ref 70–99)
Glucose-Capillary: 130 mg/dL — ABNORMAL HIGH (ref 70–99)
Glucose-Capillary: 97 mg/dL (ref 70–99)
Glucose-Capillary: 106 mg/dL — ABNORMAL HIGH (ref 70–99)

## 2020-04-07 LAB — FIBRINOGEN: Fibrinogen: 695 mg/dL — ABNORMAL HIGH (ref 210–475)

## 2020-04-07 MED ORDER — METOPROLOL SUCCINATE ER 50 MG PO TB24
50.0000 mg | ORAL_TABLET | Freq: Every day | ORAL | Status: DC
  Administered 2020-04-08: 50 mg via ORAL
  Filled 2020-04-07: qty 1

## 2020-04-07 MED ORDER — POLYETHYLENE GLYCOL 3350 17 G PO PACK
17.0000 g | PACK | Freq: Every day | ORAL | Status: DC
  Filled 2020-04-07 (×2): qty 1

## 2020-04-07 MED ORDER — APIXABAN 5 MG PO TABS
5.0000 mg | ORAL_TABLET | Freq: Two times a day (BID) | ORAL | Status: DC
  Administered 2020-04-07 – 2020-04-08 (×3): 5 mg via ORAL
  Filled 2020-04-07 (×3): qty 1

## 2020-04-07 NOTE — Significant Event (Signed)
Called [address] multiple times to giver report for this pt to transfer,  No answer/ reponse

## 2020-04-07 NOTE — TOC Benefit Eligibility Note (Signed)
Transition of Care [facility]) Benefit Eligibility Note    Patient Details  Name: Caleb [name] MRN: [id number] Date of Birth: 08/31/1960   Medication/Dose: Arne Cleveland  5 MG BID  Covered?: Yes  Tier: 2 Drug  Prescription Coverage Preferred Pharmacy: [facility]  and   [facility]  Spoke with Person/Company/Phone Number:: JARED  @ [employer] # [phone]  Co-Pay: $ 35.00  Prior Approval: No  Deductible: Met  Additional Notes: APIXABAN : NON-FORMULARY    Memory Argue Phone Number: 04/07/2020, 2:32 PM

## 2020-04-07 NOTE — Plan of Care (Signed)
Patient is currently resting in bed. C/o pain, given PRN pain medications. VSS. Remains on RA. HR elevated, did not sustain above 120's. OOB to BR. Call bell within reach. Bed alarm on.   Problem: Education: Goal: Knowledge of risk factors and measures for prevention of condition will improve Outcome: Progressing   Problem: Coping: Goal: Psychosocial and spiritual needs will be supported Outcome: Progressing   Problem: Respiratory: Goal: Will maintain a patent airway Outcome: Progressing Goal: Complications related to the disease process, condition or treatment will be avoided or minimized Outcome: Progressing   Problem: Education: Goal: Knowledge of General Education information will improve Description: Including pain rating scale, medication(s)/side effects and non-pharmacologic comfort measures Outcome: Progressing   Problem: Health Behavior/Discharge Planning: Goal: Ability to manage health-related needs will improve Outcome: Progressing   Problem: Clinical Measurements: Goal: Ability to maintain clinical measurements within normal limits will improve Outcome: Progressing Goal: Will remain free from infection Outcome: Progressing Goal: Diagnostic test results will improve Outcome: Progressing Goal: Respiratory complications will improve Outcome: Progressing Goal: Cardiovascular complication will be avoided Outcome: Progressing   Problem: Activity: Goal: Risk for activity intolerance will decrease Outcome: Progressing   Problem: Nutrition: Goal: Adequate nutrition will be maintained Outcome: Progressing   Problem: Coping: Goal: Level of anxiety will decrease Outcome: Progressing   Problem: Elimination: Goal: Will not experience complications related to bowel motility Outcome: Progressing Goal: Will not experience complications related to urinary retention Outcome: Progressing   Problem: Pain Managment: Goal: General experience of comfort will  improve Outcome: Progressing   Problem: Safety: Goal: Ability to remain free from injury will improve Outcome: Progressing   Problem: Skin Integrity: Goal: Risk for impaired skin integrity will decrease Outcome: Progressing

## 2020-04-07 NOTE — Progress Notes (Signed)
Cardiology Progress Note  Patient ID: Caleb [name] MRN: [id number] DOB: Nov 20, 1960 Date of Encounter: 04/07/2020  Primary Cardiologist: No primary care provider on file.  Subjective   Chief Complaint: No complaints.  HPI: Back in normal sinus rhythm today.  Without complaints.  ROS:  All other ROS reviewed and negative. Pertinent positives noted in the HPI.     Inpatient Medications  Scheduled Meds:  apixaban  5 mg Oral BID   feeding supplement (ENSURE ENLIVE)  237 mL Oral BID BM   insulin aspart  0-9 Units Subcutaneous TID WC   [START ON 04/08/2020] metoprolol succinate  50 mg Oral Daily   polyethylene glycol  17 g Oral Daily   senna-docusate  2 tablet Oral BID   Continuous Infusions:  cefTRIAXone (ROCEPHIN)  IV 2 g (04/06/20 1818)   PRN Meds: chlorpheniramine-HYDROcodone, guaiFENesin-dextromethorphan, HYDROmorphone (DILAUDID) injection, metoprolol tartrate, oxyCODONE, traMADol   Vital Signs   Vitals:   04/07/20 0348 04/07/20 0444 04/07/20 0800 04/07/20 0823  BP: 106/80 112/83  107/77  Pulse: 90 89  90  Resp:  20  19  Temp:  98.1 F (36.7 C)  97.7 F (36.5 C)  TempSrc:  Oral  Oral  SpO2:  95% 98% 96%  Weight:      Height:        Intake/Output Summary (Last 24 hours) at 04/07/2020 1058 Last data filed at 04/07/2020 0825 Gross per 24 hour  Intake 320 ml  Output 1450 ml  Net -1130 ml   Last 3 Weights 04/06/2020 04/03/2020 03/11/2020  Weight (lbs) 219 lb 9.3 oz 213 lb 225 lb 12.8 oz  Weight (kg) 99.6 kg 96.616 kg 102.422 kg      Telemetry  Overnight telemetry shows normal sinus rhythm with heart rate in the seventies, which I personally reviewed.   ECG  The most recent ECG shows atrial flutter, which I personally reviewed.   Physical Exam   Vitals:   04/07/20 0348 04/07/20 0444 04/07/20 0800 04/07/20 0823  BP: 106/80 112/83  107/77  Pulse: 90 89  90  Resp:  20  19  Temp:  98.1 F (36.7 C)  97.7 F (36.5 C)  TempSrc:  Oral  Oral  SpO2:  95%  98% 96%  Weight:      Height:         Intake/Output Summary (Last 24 hours) at 04/07/2020 1058 Last data filed at 04/07/2020 0825 Gross per 24 hour  Intake 320 ml  Output 1450 ml  Net -1130 ml    Last 3 Weights 04/06/2020 04/03/2020 03/11/2020  Weight (lbs) 219 lb 9.3 oz 213 lb 225 lb 12.8 oz  Weight (kg) 99.6 kg 96.616 kg 102.422 kg    Body mass index is 28.97 kg/m.  General: Well nourished, well developed, in no acute distress Head: Atraumatic, normal size  Eyes: PEERLA, EOMI  Neck: Supple, no JVD Endocrine: No thryomegaly Cardiac: Normal S1, S2; RRR; no murmurs, rubs, or gallops Lungs: Clear to auscultation bilaterally, no wheezing, rhonchi or rales  Abd: Soft, nontender, no hepatomegaly  Ext: No edema, pulses 2+ Musculoskeletal: No deformities, BUE and BLE strength normal and equal Skin: Warm and dry, no rashes   Neuro: Alert and oriented to person, place, time, and situation, CNII-XII grossly intact, no focal deficits  Psych: Normal mood and affect   Labs  High Sensitivity Troponin:  No results for input(s): TROPONINIHS in the last 720 hours.   Cardiac EnzymesNo results for input(s): TROPONINI in the last 168 hours.  No results for input(s): TROPIPOC in the last 168 hours.  Chemistry Recent Labs  Lab 04/05/20 0401 04/06/20 0457 04/07/20 0551  NA 133* 135 135  K 3.4* 3.3* 4.1  CL 103 103 104  CO2 21* 23 21*  GLUCOSE 94 103* 95  BUN 8 7 8   CREATININE 0.70 0.65 0.62  CALCIUM 8.2* 8.3* 8.3*  PROT 6.6 6.8 6.7  ALBUMIN 2.5* 2.4* 2.3*  AST 51* 45* 49*  ALT 54* 47* 51*  ALKPHOS 103 115 127*  BILITOT 0.5 0.6 0.6  GFRNONAA >60 >60 >60  GFRAA >60 >60 >60  ANIONGAP 9 9 10     Hematology Recent Labs  Lab 04/05/20 0401 04/06/20 0457 04/07/20 0551  WBC 4.5 4.0 3.8*  RBC 4.07* 4.35 4.37  HGB 11.9* 12.7* 13.1  HCT 36.6* 38.8* 39.5  MCV 89.9 89.2 90.4  MCH 29.2 29.2 30.0  MCHC 32.5 32.7 33.2  RDW 13.1 12.8 13.0  PLT 161 161 206   BNPNo results for input(s): BNP,  PROBNP in the last 168 hours.  DDimer  Recent Labs  Lab 04/05/20 0401 04/06/20 0457 04/07/20 0551  DDIMER 2.08* 2.24* 2.21*     Radiology  ECHOCARDIOGRAM LIMITED  Result Date: 04/05/2020    ECHOCARDIOGRAM LIMITED REPORT   Patient Name:   Caleb [name] Date of Exam: 04/05/2020 Medical Rec #:  [id number]       Height:       73.0 in Accession #:    [id number]      Weight:       213.0 lb Date of Birth:  04/28/1961       BSA:          2.210 m Patient Age:    59 years        BP:           97/63 mmHg Patient Gender: M               HR:           80 bpm. Exam Location:  Inpatient Procedure: Limited Echo, Limited Color Doppler and Cardiac Doppler Indications:    bacteremia 790.7  History:        Patient has no prior history of Echocardiogram examinations.                 Covid. sepsis; Arrythmias:Atrial Flutter.  Sonographer:    Delcie RochLauren Pennington Referring Phys: [id number] ABRAHAM FELIZ ORTIZ IMPRESSIONS  1. Left ventricular ejection fraction, by estimation, is 50 to 55%. The left ventricle has low normal function.  2. Right ventricular systolic function is normal. The right ventricular size is normal. There is normal pulmonary artery systolic pressure. The estimated right ventricular systolic pressure is 26.3 mmHg.  3. The mitral valve is normal in structure. Mild mitral valve regurgitation.  4. The aortic valve is possibly bicuspid. There is mild calcification of the aortic valve. There is severe thickening of the aortic valve. Mild to moderate aortic valve sclerosis/calcification is present, without any evidence of aortic stenosis.  5. The inferior vena cava is dilated in size with >50% respiratory variability, suggesting right atrial pressure of 8 mmHg. Conclusion(s)/Recommendation(s): No evidence of valvular vegetations on this transthoracic echocardiogram. Would recommend a transesophageal echocardiogram to exclude infective endocarditis if clinically indicated. FINDINGS  Left Ventricle: Left ventricular  ejection fraction, by estimation, is 50 to 55%. The left ventricle has low normal function. The left ventricular internal cavity size was normal in size. There is no left ventricular hypertrophy.  Left ventricular diastolic function could not be evaluated due to atrial fibrillation. Right Ventricle: The right ventricular size is normal. No increase in right ventricular wall thickness. Right ventricular systolic function is normal. There is normal pulmonary artery systolic pressure. The tricuspid regurgitant velocity is 2.14 m/s, and  with an assumed right atrial pressure of 8 mmHg, the estimated right ventricular systolic pressure is 26.3 mmHg. Left Atrium: Left atrial size was normal in size. Right Atrium: Right atrial size was normal in size. Pericardium: There is no evidence of pericardial effusion. Mitral Valve: The mitral valve is normal in structure. Mild mitral valve regurgitation. Tricuspid Valve: The tricuspid valve is normal in structure. Tricuspid valve regurgitation is trivial. Aortic Valve: The aortic valve is bicuspid. There is mild calcification of the aortic valve. There is severe thickening of the aortic valve. Mild to moderate aortic valve sclerosis/calcification is present, without any evidence of aortic stenosis. Pulmonic Valve: The pulmonic valve was not well visualized. Aorta: The aortic root is normal in size and structure. Venous: The inferior vena cava is dilated in size with greater than 50% respiratory variability, suggesting right atrial pressure of 8 mmHg. LEFT VENTRICLE PLAX 2D LVIDd:         4.90 cm LVIDs:         3.80 cm LV PW:         0.80 cm LV IVS:        0.70 cm LVOT diam:     2.10 cm LV SV:         47 LV SV Index:   21 LVOT Area:     3.46 cm  IVC IVC diam: 2.20 cm LEFT ATRIUM         Index LA diam:    3.70 cm 1.67 cm/m  AORTIC VALVE LVOT Vmax:   83.60 cm/s LVOT Vmean:  57.400 cm/s LVOT VTI:    0.135 m  AORTA Ao Root diam: 3.20 cm TRICUSPID VALVE TR Peak grad:   18.3 mmHg TR  Vmax:        214.00 cm/s  SHUNTS Systemic VTI:  0.14 m Systemic Diam: 2.10 cm Thurmon Fair MD Electronically signed by Thurmon Fair MD Signature Date/Time: 04/05/2020/2:46:49 PM    Final     Cardiac Studies  TTE 04/05/2020  1. Left ventricular ejection fraction, by estimation, is 50 to 55%. The  left ventricle has low normal function.  2. Right ventricular systolic function is normal. The right ventricular  size is normal. There is normal pulmonary artery systolic pressure. The  estimated right ventricular systolic pressure is 26.3 mmHg.  3. The mitral valve is normal in structure. Mild mitral valve  regurgitation.  4. The aortic valve is possibly bicuspid. There is mild calcification of  the aortic valve. There is severe thickening of the aortic valve. Mild to  moderate aortic valve sclerosis/calcification is present, without any  evidence of aortic stenosis.  5. The inferior vena cava is dilated in size with >50% respiratory  variability, suggesting right atrial pressure of 8 mmHg.   Patient Profile  Caleb [name] is a 59 y.o. male with diabetes, gout who was admitted on 2020-04-03 with discitis.  Course complicated by atrial flutter and COVID-19.  Assessment & Plan   1.  New onset atrial flutter -Has converted back to sinus rhythm. -CHA2DS2-VASc score is 1.  I recommend at least 4 weeks of anticoagulation post cardioversion. -We will have him follow with Dr. Sharrell Ku for possible a flutter ablation.  He may decide to monitor him as he is converted back to sinus rhythm. -Transition to metoprolol succinate 50 mg daily  [facility] will sign off.   Medication Recommendations: Eliquis 5 mg twice daily, metoprolol succinate 50 mg daily Other recommendations (labs, testing, etc): None Follow up as an outpatient: We will arrange follow-up with Dr. Sharrell Ku in 4 to 6 weeks  For questions or updates, please contact [facility] Please consult [email or web address] for  contact info under   Time Spent with Patient: I have spent a total of 25 minutes with patient reviewing hospital notes, telemetry, EKGs, labs and examining the patient as well as establishing an assessment and plan that was discussed with the patient.  > 50% of time was spent in direct patient care.    Signed, Lenna Gilford. Flora Lipps, MD [facility]   [facility]  04/07/2020 10:58 AM

## 2020-04-07 NOTE — Progress Notes (Signed)
ANTICOAGULATION CONSULT NOTE - Initial Consult  Pharmacy Consult for  Apixaban  Indication: new Atrial flutter  No Known Allergies  Patient Measurements: Height: 6\' 1"  (185.4 cm) Weight: 99.6 kg (219 lb 9.3 oz) IBW/kg (Calculated) : 79.9    Vital Signs: Temp: 97.7 F (36.5 C) (09/28 0823) Temp Source: Oral (09/28 0823) BP: 107/77 (09/28 0823) Pulse Rate: 90 (09/28 0823)  Labs: Recent Labs    04/05/20 0401 04/05/20 0401 04/06/20 0457 04/07/20 0551  HGB 11.9*   < > 12.7* 13.1  HCT 36.6*  --  38.8* 39.5  PLT 161  --  161 206  CREATININE 0.70  --  0.65 0.62   < > = values in this interval not displayed.    Estimated Creatinine Clearance: 123.5 mL/min (by C-G formula based on SCr of 0.62 mg/dL).   Medical History: History reviewed. No pertinent past medical history.  Medications:  Medications Prior to Admission  Medication Sig Dispense Refill Last Dose   colchicine 0.6 MG tablet Take 0.6 mg by mouth 2 (two) times daily as needed (gout flares).    Past Month at Unknown time   cyclobenzaprine (FLEXERIL) 10 MG tablet Take 10 mg by mouth 3 (three) times daily as needed for muscle spasms.   unk   metFORMIN (GLUCOPHAGE) 500 MG tablet Take 1 tablet (500 mg total) by mouth 2 (two) times daily with a meal. 60 tablet 11 04/03/2020 at Unknown time   Oxycodone HCl 10 MG TABS Take 10 mg by mouth every 6 (six) hours as needed for pain.   Past Week at Unknown time   tamsulosin (FLOMAX) 0.4 MG CAPS capsule Take 0.4 mg by mouth daily.   04/02/2020   Blood Glucose Monitoring Suppl (TRUE METRIX AIR GLUCOSE METER) DEVI Check CBG BID 1 each 11     Assessment: 59 y.o male presented to [facility]/24 after seen at [facility] orth[facility]lin[facility] for severe back pain of 4 weeks duration.  CT scan of the abdomen was concerning for L3-L4 discitis and osteomyelitis.  New atrial flutter.  Cardiology consulted/ following.  Converted to sinus rhythm.  Starting  him on Eliquis 04/07/20.  CBC improved to  within normal.  No bleeding noted.  He last received prophylaxis dose of lovenox 40mg  on 9/27 AM. Age <80, Scr <1.5, weight >60 kg.  Appropriate dose of Eliquis is 5 mg twice daily.  Goal of Therapy:  Stroke prevention Monitor platelets by anticoagulation protocol: Yes   Plan:  Apixaban 5 mg BID CM consult for benefits check,  30 day free card for 1st prescription. Will educate patient about apixaban prior to discharge.  , RPh Clinical Pharmacist [phone] Please check AMION for all [facility] Pharm[facility] phone numbers After 10:00 PM, call Main Pharmacy [phone]  04/07/2020,9:44 AM

## 2020-04-07 NOTE — TOC Initial Note (Addendum)
Transition of Care [facility]) - Initial/Assessment Note    Patient Details  Name: USMAN MILLETT MRN: [id number] Date of Birth: 01-19-1961  Transition of [facility]) CM/SW Contact:    Lawerance Sabal, RN Phone Number: 04/07/2020, 11:50 AM  Clinical Narrative:          Sherron Monday w patient re set up of home IV Abx. He confirms he lives alone, demographics verified. Anticipate patient will be able to self administer daily rocephin if extender tubing is placed on PICC.  Referral placed to [facility], who will set up with [facility] for [facility] RN. Eliquis bene check pending. Patient will qualify for 30 day free card and copay reduction card. Please send Rx to [facility] pharmacy.    Awaiting PICC placement  [facility] RN order needed.          Expected Discharge Plan: Home w Home Health Services Barriers to Discharge: Continued Medical Work up   Patient Goals and CMS Choice Patient states their goals for this hospitalization and ongoing recovery are:: to go home      Expected Discharge Plan and Services Expected Discharge Plan: Home w Home Health Services   Discharge Planning Services: CM Consult Post Acute Care Choice: Home Health                             HH Arranged: RN [facility] Agency: [facility] Date [facility] Agency Contacted: 04/07/20 Time HH Agency Contacted: [phone] Representative spoke with at [facility] Agency: Pam  Prior Living Arrangements/Services   Lives with:: Self                   Activities of Daily Living Home Assistive Devices/Equipment: Crutches, Hand-held shower hose ADL Screening (condition at time of admission) Patient's cognitive ability adequate to safely complete daily activities?: Yes Is the patient deaf or have difficulty hearing?: No Does the patient have difficulty seeing, even when wearing glasses/contacts?: No Does the patient have difficulty concentrating, remembering, or making decisions?: No Patient able to express need for assistance with ADLs?: Yes Does the patient  have difficulty dressing or bathing?: No Independently performs ADLs?: Yes (appropriate for developmental age) Does the patient have difficulty walking or climbing stairs?: No Weakness of Legs: None Weakness of Arms/Hands: None  Permission Sought/Granted                  Emotional Assessment              Admission diagnosis:  Diskitis [M46.40] Patient Active Problem List   Diagnosis Date Noted  . COVID-19 virus infection 04/06/2020  . Typical atrial flutter (HCC)   . Sepsis (HCC) 04/04/2020  . Hyponatremia 04/04/2020  . Osteomyelitis of lumbar spine (HCC) 04/04/2020  . Diskitis 04/03/2020  . Diabetes mellitus type 2 with complications, uncontrolled (HCC) 03/11/2020  . Gout 03/11/2020   PCP:  Lavada Mesi, MD Pharmacy:   [facility] [address] [place]),  - [address] [place] [address] Phone: [phone] Fax: [phone]     Social Determinants of Health (SDOH) Interventions    Readmission Risk Interventions No flowsheet data found.

## 2020-04-07 NOTE — Discharge Instructions (Signed)

## 2020-04-07 NOTE — Progress Notes (Signed)
Patient converted back into NSR at 0537.

## 2020-04-07 NOTE — Progress Notes (Signed)
Transitions of Care Pharmacist Note  Caleb [name] is a 59 y.o. male that has been diagnosed with new onset atrial flutter and will be prescribed Eliquis (apixaban) at discharge.   Patient Education: I provided the following education on 04/07/2020 to the patient: How to take the medication Described what the medication is Signs of bleeding Signs/symptoms of VTE and stroke  Answered their questions  Discharge Medications Plan: The patient is not interested in filling their discharge medications with the Transitions of Care pharmacy at this time.   If the patient later decides they would like to have the Transitions of Care pharmacy fill their discharge medications, please call us at [phone]. Thank you.    Thank you,  Calton Dach, PharmD PGY1 Pharmacy Resident 04/07/2020 5:55 PM

## 2020-04-07 NOTE — Plan of Care (Signed)

## 2020-04-07 NOTE — Progress Notes (Addendum)
[facility] PROGRESS NOTE    Progress Note  Caleb [name]  ZOX:[id number] DOB: 31-Oct-1960 DOA: 04/03/2020 PCP: Lavada Mesi, MD     Brief Narrative:   Caleb [name] is an 59 y.o. male past medical history of diabetes mellitus type 2, gout, who has not been vaccinated against covid-19 who was seen at the [facility] renal clinic on the day of presentation due to back pain fourth that started 4 weeks prior to admission.  He received steroid injection with minimal improvement in his pain.  CT scan of the abdomen was concerning for L3-L4 discitis and osteomyelitis.  Assessment/Plan:   Sepsis new L3-L4 discitis osteomyelitis present on admission: CT scan of the abdomen pelvis show possible L3-L4 discitis osteomyelitis done on 04/03/2020. He has remained afebrile with no leukocytosis. Infectious diseases been consulted, if surveillance blood culture remain negative for an additional 24 hours ccan proceed with PICC line she will need 6 weeks of IV antibiotics.  Asymptomatic COVID-19 infection: Continues to remain afebrile with no leukocytosis, satting greater than 94% on room air. We will continue to follow his saturations closely.  Typical atrial flutter: Cardiology has been consulted currently on oral diltiazem and metoprolol. His heart rate has been persistently below 100, he is converted to sinus rhythm we will start him on Eliquis.  Diabetes mellitus type 2 with hyperglycemia: With an A1c of 8.7. Oral hypoglycemic agents have been held.  History of gout: No flares.   DVT prophylaxis: lovenox Family Communication:none Status is: Inpatient  Remains inpatient appropriate because:Hemodynamically unstable   Dispo:  Patient From: Home  Planned Disposition: Home  Expected discharge date: 04/09/20  Medically stable for discharge: No once PICC line in place.    Code Status:     Code Status Orders  (From admission, onward)         Start     Ordered   04/03/20  2149  Full code  Continuous        04/03/20 2150        Code Status History    This patient has a current code status but no historical code status.   Advance Care Planning Activity        IV Access:    Peripheral IV   Procedures and diagnostic studies:   ECHOCARDIOGRAM LIMITED  Result Date: 04/05/2020    ECHOCARDIOGRAM LIMITED REPORT   Patient Name:   Caleb [name] Date of Exam: 04/05/2020 Medical Rec #:  [id number]       Height:       73.0 in Accession #:    [id number]      Weight:       213.0 lb Date of Birth:  [birth year]       BSA:          2.210 m Patient Age:    59 years        BP:           97/63 mmHg Patient Gender: M               HR:           80 bpm. Exam Location:  Inpatient Procedure: Limited Echo, Limited Color Doppler and Cardiac Doppler Indications:    bacteremia 790.7  History:        Patient has no prior history of Echocardiogram examinations.                 Covid. sepsis; Arrythmias:Atrial Flutter.  Sonographer:  Delcie Roch Referring Phys: [phone] Pete Merten FELIZ ORTIZ IMPRESSIONS  1. Left ventricular ejection fraction, by estimation, is 50 to 55%. The left ventricle has low normal function.  2. Right ventricular systolic function is normal. The right ventricular size is normal. There is normal pulmonary artery systolic pressure. The estimated right ventricular systolic pressure is 26.3 mmHg.  3. The mitral valve is normal in structure. Mild mitral valve regurgitation.  4. The aortic valve is possibly bicuspid. There is mild calcification of the aortic valve. There is severe thickening of the aortic valve. Mild to moderate aortic valve sclerosis/calcification is present, without any evidence of aortic stenosis.  5. The inferior vena cava is dilated in size with >50% respiratory variability, suggesting right atrial pressure of 8 mmHg. Conclusion(s)/Recommendation(s): No evidence of valvular vegetations on this transthoracic echocardiogram. Would recommend a  transesophageal echocardiogram to exclude infective endocarditis if clinically indicated. FINDINGS  Left Ventricle: Left ventricular ejection fraction, by estimation, is 50 to 55%. The left ventricle has low normal function. The left ventricular internal cavity size was normal in size. There is no left ventricular hypertrophy. Left ventricular diastolic function could not be evaluated due to atrial fibrillation. Right Ventricle: The right ventricular size is normal. No increase in right ventricular wall thickness. Right ventricular systolic function is normal. There is normal pulmonary artery systolic pressure. The tricuspid regurgitant velocity is 2.14 m/s, and  with an assumed right atrial pressure of 8 mmHg, the estimated right ventricular systolic pressure is 26.3 mmHg. Left Atrium: Left atrial size was normal in size. Right Atrium: Right atrial size was normal in size. Pericardium: There is no evidence of pericardial effusion. Mitral Valve: The mitral valve is normal in structure. Mild mitral valve regurgitation. Tricuspid Valve: The tricuspid valve is normal in structure. Tricuspid valve regurgitation is trivial. Aortic Valve: The aortic valve is bicuspid. There is mild calcification of the aortic valve. There is severe thickening of the aortic valve. Mild to moderate aortic valve sclerosis/calcification is present, without any evidence of aortic stenosis. Pulmonic Valve: The pulmonic valve was not well visualized. Aorta: The aortic root is normal in size and structure. Venous: The inferior vena cava is dilated in size with greater than 50% respiratory variability, suggesting right atrial pressure of 8 mmHg. LEFT VENTRICLE PLAX 2D LVIDd:         4.90 cm LVIDs:         3.80 cm LV PW:         0.80 cm LV IVS:        0.70 cm LVOT diam:     2.10 cm LV SV:         47 LV SV Index:   21 LVOT Area:     3.46 cm  IVC IVC diam: 2.20 cm LEFT ATRIUM         Index LA diam:    3.70 cm 1.67 cm/m  AORTIC VALVE LVOT Vmax:    83.60 cm/s LVOT Vmean:  57.400 cm/s LVOT VTI:    0.135 m  AORTA Ao Root diam: 3.20 cm TRICUSPID VALVE TR Peak grad:   18.3 mmHg TR Vmax:        214.00 cm/s  SHUNTS Systemic VTI:  0.14 m Systemic Diam: 2.10 cm Rachelle Hora Croitoru MD Electronically signed by Thurmon Fair MD Signature Date/Time: 04/05/2020/2:46:49 PM    Final      Medical Consultants:    None.  Anti-Infectives:   IV Vanc and cefpeime  Subjective:    Caleb [name]  no further pain comfortable has not had a bowel movement.  Objective:    Vitals:   04/07/20 0100 04/07/20 0348 04/07/20 0444 04/07/20 0823  BP: 103/77 106/80 112/83 107/77  Pulse: 79 90 89 90  Resp: 20  20 19   Temp: 98 F (36.7 C)  98.1 F (36.7 C) 97.7 F (36.5 C)  TempSrc: Oral  Oral Oral  SpO2: 95%  95% 96%  Weight:      Height:       SpO2: 96 %   Intake/Output Summary (Last 24 hours) at 04/07/2020 0857 Last data filed at 04/07/2020 0825 Gross per 24 hour  Intake 320 ml  Output 1700 ml  Net -1380 ml   Filed Weights   04/03/20 2200 04/06/20 0459  Weight: 96.6 kg 99.6 kg    Exam: General exam: In no acute distress. Respiratory system: Good air movement and clear to auscultation. Cardiovascular system: S1 & S2 heard, RRR. No JVD. Gastrointestinal system: Abdomen is nondistended, soft and nontender.  Extremities: No pedal edema. Skin: No rashes, lesions or ulcers  Data Reviewed:    Labs: Basic Metabolic Panel: Recent Labs  Lab 04/03/20 2322 04/03/20 2322 04/04/20 0115 04/04/20 0115 04/05/20 0401 04/05/20 0401 04/06/20 0457 04/07/20 0551  NA 132*  --  133*  --  133*  --  135 135  K 3.4*   < > 3.6   < > 3.4*   < > 3.3* 4.1  CL 100  --  98  --  103  --  103 104  CO2 20*  --  21*  --  21*  --  23 21*  GLUCOSE 138*  --  130*  --  94  --  103* 95  BUN 14  --  13  --  8  --  7 8  CREATININE 0.75  --  0.78  --  0.70  --  0.65 0.62  CALCIUM 8.4*  --  8.3*  --  8.2*  --  8.3* 8.3*  MG 1.6*  --   --   --   --   --   --   --     PHOS 3.6  --   --   --   --   --   --   --    < > = values in this interval not displayed.   GFR Estimated Creatinine Clearance: 123.5 mL/min (by C-G formula based on SCr of 0.62 mg/dL). Liver Function Tests: Recent Labs  Lab 04/03/20 2322 04/05/20 0401 04/06/20 0457 04/07/20 0551  AST 89* 51* 45* 49*  ALT 88* 54* 47* 51*  ALKPHOS 119 103 115 127*  BILITOT 0.9 0.5 0.6 0.6  PROT 7.2 6.6 6.8 6.7  ALBUMIN 2.5* 2.5* 2.4* 2.3*   No results for input(s): LIPASE, AMYLASE in the last 168 hours. No results for input(s): AMMONIA in the last 168 hours. Coagulation profile No results for input(s): INR, PROTIME in the last 168 hours. COVID-19 Labs  Recent Labs    04/05/20 0401 04/06/20 0457 04/07/20 0551  DDIMER 2.08* 2.24* 2.21*  FERRITIN 1,196* 1,000* 933*  CRP 6.8* 8.7* 8.0*    Lab Results  Component Value Date   SARSCOV2NAA POSITIVE (A) 04/03/2020   SARSCOV2NAA NEGATIVE 03/03/2020    CBC: Recent Labs  Lab 04/03/20 2322 04/04/20 0115 04/05/20 0401 04/06/20 0457 04/07/20 0551  WBC 5.2 5.4 4.5 4.0 3.8*  NEUTROABS 3.8  --  3.4 2.9 2.8  HGB 13.1 13.6 11.9* 12.7* 13.1  HCT 39.3 40.6 36.6* 38.8* 39.5  MCV 88.5 88.5 89.9 89.2 90.4  PLT 180 209 161 161 206   Cardiac Enzymes: No results for input(s): CKTOTAL, CKMB, CKMBINDEX, TROPONINI in the last 168 hours. BNP (last 3 results) No results for input(s): PROBNP in the last 8760 hours. CBG: Recent Labs  Lab 04/06/20 0719 04/06/20 1132 04/06/20 1608 04/06/20 2103 04/07/20 0820  GLUCAP 108* 109* 94 110* 95   D-Dimer: Recent Labs    04/06/20 0457 04/07/20 0551  DDIMER 2.24* 2.21*   Hgb A1c: No results for input(s): HGBA1C in the last 72 hours. Lipid Profile: No results for input(s): CHOL, HDL, LDLCALC, TRIG, CHOLHDL, LDLDIRECT in the last 72 hours. Thyroid function studies: No results for input(s): TSH, T4TOTAL, T3FREE, THYROIDAB in the last 72 hours.  Invalid input(s): FREET3 Anemia work up: Recent  Labs    04/06/20 0457 04/07/20 0551  FERRITIN 1,000* 933*   Sepsis Labs: Recent Labs  Lab 04/04/20 0115 04/04/20 0737 04/05/20 0401 04/06/20 0457 04/07/20 0551  WBC 5.4  --  4.5 4.0 3.8*  LATICACIDVEN  --  1.0  --   --   --    Microbiology Recent Results (from the past 240 hour(s))  Culture, blood (routine x 2)     Status: Abnormal   Collection Time: 04/03/20 11:15 PM   Specimen: BLOOD  Result Value Ref Range Status   Specimen Description BLOOD LEFT ANTECUBITAL  Final   Special Requests   Final    BOTTLES DRAWN AEROBIC AND ANAEROBIC Blood Culture adequate volume   Culture  Setup Time   Final    GRAM POSITIVE COCCI IN BOTH AEROBIC AND ANAEROBIC BOTTLES CRITICAL RESULT CALLED TO, READ BACK BY AND VERIFIED WITH: PHARMD K HURTH [id number] MLM    Culture (A)  Final    STREPTOCOCCUS INFANTARIUS SUSCEPTIBILITIES PERFORMED ON PREVIOUS CULTURE WITHIN THE LAST 5 DAYS. Performed at [facility], [address], [place], [place] [address]    Report Status 04/06/2020 FINAL  Final  Blood Culture ID Panel (Reflexed)     Status: Abnormal   Collection Time: 04/03/20 11:15 PM  Result Value Ref Range Status   Enterococcus faecalis NOT DETECTED NOT DETECTED Final   Enterococcus Faecium NOT DETECTED NOT DETECTED Final   Listeria monocytogenes NOT DETECTED NOT DETECTED Final   Staphylococcus species NOT DETECTED NOT DETECTED Final   Staphylococcus aureus (BCID) NOT DETECTED NOT DETECTED Final   Staphylococcus epidermidis NOT DETECTED NOT DETECTED Final   Staphylococcus lugdunensis NOT DETECTED NOT DETECTED Final   Streptococcus species DETECTED (A) NOT DETECTED Final    Comment: Not Enterococcus species, Streptococcus agalactiae, Streptococcus pyogenes, or Streptococcus pneumoniae. CRITICAL RESULT CALLED TO, READ BACK BY AND VERIFIED WITH: PHARMD K HURTH [id number] MLM    Streptococcus agalactiae NOT DETECTED NOT DETECTED Final   Streptococcus pneumoniae NOT DETECTED NOT  DETECTED Final   Streptococcus pyogenes NOT DETECTED NOT DETECTED Final   A.calcoaceticus-baumannii NOT DETECTED NOT DETECTED Final   Bacteroides fragilis NOT DETECTED NOT DETECTED Final   Enterobacterales NOT DETECTED NOT DETECTED Final   Enterobacter cloacae complex NOT DETECTED NOT DETECTED Final   Escherichia coli NOT DETECTED NOT DETECTED Final   Klebsiella aerogenes NOT DETECTED NOT DETECTED Final   Klebsiella oxytoca NOT DETECTED NOT DETECTED Final   Klebsiella pneumoniae NOT DETECTED NOT DETECTED Final   Proteus species NOT DETECTED NOT DETECTED Final   Salmonella species NOT DETECTED NOT DETECTED Final   Serratia marcescens NOT DETECTED NOT  DETECTED Final   Haemophilus influenzae NOT DETECTED NOT DETECTED Final   Neisseria meningitidis NOT DETECTED NOT DETECTED Final   Pseudomonas aeruginosa NOT DETECTED NOT DETECTED Final   Stenotrophomonas maltophilia NOT DETECTED NOT DETECTED Final   Candida albicans NOT DETECTED NOT DETECTED Final   Candida auris NOT DETECTED NOT DETECTED Final   Candida glabrata NOT DETECTED NOT DETECTED Final   Candida krusei NOT DETECTED NOT DETECTED Final   Candida parapsilosis NOT DETECTED NOT DETECTED Final   Candida tropicalis NOT DETECTED NOT DETECTED Final   Cryptococcus neoformans/gattii NOT DETECTED NOT DETECTED Final    Comment: Performed at [facility], [address] [place], [place] [address]  Respiratory Panel by RT PCR (Flu A&B, Covid) - Nasopharyngeal Swab     Status: Abnormal   Collection Time: 04/03/20 11:37 PM   Specimen: Nasopharyngeal Swab  Result Value Ref Range Status   SARS Coronavirus 2 by RT PCR POSITIVE (A) NEGATIVE Final    Comment: RESULT CALLED TO, READ BACK BY AND VERIFIED WITH: B SCHERER RN 04/04/20 0104 JDW (NOTE) SARS-CoV-2 target nucleic acids are DETECTED.  SARS-CoV-2 RNA is generally detectable in upper respiratory specimens  during the acute phase of infection. Positive results are indicative of the  presence of the identified virus, but do not rule out bacterial infection or co-infection with other pathogens not detected by the test. Clinical correlation with patient history and other diagnostic information is necessary to determine patient infection status. The expected result is Negative.  Fact Sheet for Patients:  https://www.moore.com/  Fact Sheet for Healthcare Providers: https://www.young.biz/  This test is not yet approved or cleared by the [place] FDA and  has been authorized for detection and/or diagnosis of SARS-CoV-2 by FDA under an Emergency Use Authorization (EUA).  This EUA will remain in effect (meaning this test can be used)  for the duration of  the COVID-19 declaration under Section 564(b)(1) of the Act, 21 U.S.C. section 360bbb-3(b)(1), unless the authorization is terminated or revoked sooner.      Influenza A by PCR NEGATIVE NEGATIVE Final   Influenza B by PCR NEGATIVE NEGATIVE Final    Comment: (NOTE) The Xpert Xpress SARS-CoV-2/FLU/RSV assay is intended as an aid in  the diagnosis of influenza from Nasopharyngeal swab specimens and  should not be used as a sole basis for treatment. Nasal washings and  aspirates are unacceptable for Xpert Xpress SARS-CoV-2/FLU/RSV  testing.  Fact Sheet for Patients: https://www.moore.com/  Fact Sheet for Healthcare Providers: https://www.young.biz/  This test is not yet approved or cleared by the [place] FDA and  has been authorized for detection and/or diagnosis of SARS-CoV-2 by  FDA under an Emergency Use Authorization (EUA). This EUA will remain  in effect (meaning this test can be used) for the duration of the  Covid-19 declaration under Section 564(b)(1) of the Act, 21  U.S.C. section 360bbb-3(b)(1), unless the authorization is  terminated or revoked. Performed at [facility], [address] [place],  [place] [address]   MRSA PCR Screening     Status: None   Collection Time: 04/03/20 11:37 PM   Specimen: Nasal Mucosa; Nasopharyngeal  Result Value Ref Range Status   MRSA by PCR NEGATIVE NEGATIVE Final    Comment:        The GeneXpert MRSA Assay (FDA approved for NASAL specimens only), is one component of a comprehensive MRSA colonization surveillance program. It is not intended to diagnose MRSA infection nor to guide or monitor  treatment for MRSA infections. Performed at [facility], [address], [place], [place] [address]   Culture, blood (routine x 2)     Status: Abnormal   Collection Time: 04/03/20 11:38 PM   Specimen: BLOOD  Result Value Ref Range Status   Specimen Description BLOOD RIGHT ANTECUBITAL  Final   Special Requests   Final    BOTTLES DRAWN AEROBIC AND ANAEROBIC Blood Culture adequate volume   Culture  Setup Time   Final    GRAM POSITIVE COCCI IN BOTH AEROBIC AND ANAEROBIC BOTTLES CRITICAL VALUE NOTED.  VALUE IS CONSISTENT WITH PREVIOUSLY REPORTED AND CALLED VALUE. Performed at [facility], [address], [place], [place] [address]    Culture STREPTOCOCCUS INFANTARIUS (A)  Final   Report Status 04/06/2020 FINAL  Final   Organism ID, Bacteria STREPTOCOCCUS INFANTARIUS  Final      Susceptibility   Streptococcus infantarius - MIC*    PENICILLIN <=0.06 SENSITIVE Sensitive     CEFTRIAXONE <=0.12 SENSITIVE Sensitive     LEVOFLOXACIN 4 INTERMEDIATE Intermediate     VANCOMYCIN 0.5 SENSITIVE Sensitive     * STREPTOCOCCUS INFANTARIUS  Culture, blood (Routine X 2) w Reflex to ID Panel     Status: None (Preliminary result)   Collection Time: 04/06/20 10:30 AM   Specimen: BLOOD RIGHT HAND  Result Value Ref Range Status   Specimen Description BLOOD RIGHT HAND  Final   Special Requests   Final    BOTTLES DRAWN AEROBIC AND ANAEROBIC Blood Culture adequate volume   Culture   Final    NO GROWTH < 24 HOURS Performed at [facility], [address], [place], [place] [address]    Report Status PENDING  Incomplete  Culture, blood (Routine X 2) w Reflex to ID Panel     Status: None (Preliminary result)   Collection Time: 04/06/20 10:34 AM   Specimen: BLOOD LEFT HAND  Result Value Ref Range Status   Specimen Description BLOOD LEFT HAND  Final   Special Requests   Final    BOTTLES DRAWN AEROBIC AND ANAEROBIC Blood Culture adequate volume   Culture   Final    NO GROWTH < 24 HOURS Performed at [facility], [address] [place], [place] [address]    Report Status PENDING  Incomplete     Medications:   . apixaban  5 mg Oral BID  . diltiazem  360 mg Oral Daily  . feeding supplement (ENSURE ENLIVE)  237 mL Oral BID BM  . insulin aspart  0-9 Units Subcutaneous TID WC  . metoprolol tartrate  50 mg Oral Q6H  . senna-docusate  2 tablet Oral BID   Continuous Infusions: . cefTRIAXone (ROCEPHIN)  IV 2 g (04/06/20 1818)      LOS: 4 days   Marinda Elk  [facility]  04/07/2020, 8:57 AM

## 2020-04-07 NOTE — Progress Notes (Signed)
   04/06/20 0240  Assess: MEWS Score  Temp 99.2 F (37.3 C)  BP 103/84  Pulse Rate (!) 112  ECG Heart Rate (!) 112  Resp 15  SpO2 95 %  O2 Device Room Air  Assess: MEWS Score  MEWS Temp 0  MEWS Systolic 0  MEWS Pulse 2  MEWS RR 0  MEWS LOC 0  MEWS Score 2  MEWS Score Color Yellow  Assess: if the MEWS score is Yellow or Red  Were vital signs taken at a resting state? Yes  Focused Assessment No change from prior assessment  Early Detection of Sepsis Score *See Row Information* Low  MEWS guidelines implemented *See Row Information* Yes  Treat  MEWS Interventions Administered scheduled meds/treatments;Administered prn meds/treatments  Take Vital Signs  Increase Vital Sign Frequency  Yellow: Q 2hr X 2 then Q 4hr X 2, if remains yellow, continue Q 4hrs  Escalate  MEWS: Escalate Yellow: discuss with charge nurse/RN and consider discussing with provider and RRT  Notify: Charge Nurse/RN  Name of Charge Nurse/RN Notified Sarah, RN  Date Charge Nurse/RN Notified 04/06/20  Time Charge Nurse/RN Notified 0830  Notify: Provider  Provider Name/Title Feliz-Ortiz  Date Provider Notified 04/06/20  Time Provider Notified 0830  Notification Type Page  Notification Reason Change in status  Response No new orders  Date of Provider Response 04/06/20  Time of Provider Response [phone]  Document  Progress note created (see row info) Yes

## 2020-04-08 ENCOUNTER — Inpatient Hospital Stay: Payer: Self-pay

## 2020-04-08 LAB — COMPREHENSIVE METABOLIC PANEL
ALT: 48 U/L — ABNORMAL HIGH (ref 0–44)
AST: 44 U/L — ABNORMAL HIGH (ref 15–41)
Albumin: 2.4 g/dL — ABNORMAL LOW (ref 3.5–5.0)
Alkaline Phosphatase: 136 U/L — ABNORMAL HIGH (ref 38–126)
Anion gap: 12 (ref 5–15)
BUN: 7 mg/dL (ref 6–20)
CO2: 21 mmol/L — ABNORMAL LOW (ref 22–32)
Calcium: 8.3 mg/dL — ABNORMAL LOW (ref 8.9–10.3)
Chloride: 102 mmol/L (ref 98–111)
Creatinine, Ser: 0.62 mg/dL (ref 0.61–1.24)
GFR calc Af Amer: >60 mL/min (ref >60)
GFR calc non Af Amer: >60 mL/min (ref >60)
Glucose, Bld: 163 mg/dL — ABNORMAL HIGH (ref 70–99)
Potassium: 3.9 mmol/L (ref 3.5–5.1)
Sodium: 135 mmol/L (ref 135–145)
Total Bilirubin: 0.5 mg/dL (ref 0.3–1.2)
Total Protein: 6.9 g/dL (ref 6.5–8.1)

## 2020-04-08 LAB — CBC WITH DIFFERENTIAL/PLATELET
Abs Immature Granulocytes: 0.02 10*3/uL (ref 0.00–0.07)
Basophils Absolute: 0 10*3/uL (ref 0.0–0.1)
Basophils Relative: 0 %
Eosinophils Absolute: 0 10*3/uL (ref 0.0–0.5)
Eosinophils Relative: 1 %
HCT: 38.1 % — ABNORMAL LOW (ref 39.0–52.0)
Hemoglobin: 12.4 g/dL — ABNORMAL LOW (ref 13.0–17.0)
Immature Granulocytes: 1 %
Lymphocytes Relative: 13 %
Lymphs Abs: 0.5 10*3/uL — ABNORMAL LOW (ref 0.7–4.0)
MCH: 29 pg (ref 26.0–34.0)
MCHC: 32.5 g/dL (ref 30.0–36.0)
MCV: 89 fL (ref 80.0–100.0)
Monocytes Absolute: 0.3 10*3/uL (ref 0.1–1.0)
Monocytes Relative: 7 %
Neutro Abs: 2.8 10*3/uL (ref 1.7–7.7)
Neutrophils Relative %: 78 %
Platelets: 215 10*3/uL (ref 150–400)
RBC: 4.28 MIL/uL (ref 4.22–5.81)
RDW: 13.1 % (ref 11.5–15.5)
WBC: 3.6 10*3/uL — ABNORMAL LOW (ref 4.0–10.5)
nRBC: 0 % (ref 0.0–0.2)

## 2020-04-08 LAB — GLUCOSE, CAPILLARY
Glucose-Capillary: 101 mg/dL — ABNORMAL HIGH (ref 70–99)
Glucose-Capillary: 102 mg/dL — ABNORMAL HIGH (ref 70–99)
Glucose-Capillary: 144 mg/dL — ABNORMAL HIGH (ref 70–99)

## 2020-04-08 LAB — C-REACTIVE PROTEIN: CRP: 6.6 mg/dL — ABNORMAL HIGH (ref <1.0)

## 2020-04-08 LAB — FERRITIN: Ferritin: 792 ng/mL — ABNORMAL HIGH (ref 24–336)

## 2020-04-08 LAB — D-DIMER, QUANTITATIVE: D-Dimer, Quant: 2.05 ug/mL-FEU — ABNORMAL HIGH (ref 0.00–0.50)

## 2020-04-08 LAB — FIBRINOGEN: Fibrinogen: 688 mg/dL — ABNORMAL HIGH (ref 210–475)

## 2020-04-08 MED ORDER — APIXABAN 5 MG PO TABS
5.0000 mg | ORAL_TABLET | Freq: Two times a day (BID) | ORAL | 0 refills | Status: DC
Start: 2020-04-08 — End: 2020-05-11

## 2020-04-08 MED ORDER — METOPROLOL SUCCINATE ER 50 MG PO TB24
50.0000 mg | ORAL_TABLET | Freq: Every day | ORAL | 0 refills | Status: DC
Start: 2020-04-09 — End: 2020-05-12

## 2020-04-08 MED ORDER — CEFTRIAXONE IV (FOR PTA / DISCHARGE USE ONLY): 2.0000 g | INTRAVENOUS | 0 refills | Status: AC

## 2020-04-08 MED ORDER — PENICILLIN G POTASSIUM 20000000 UNITS IJ SOLR
4.0000 10*6.[IU] | Freq: Once | INTRAVENOUS | Status: DC
  Filled 2020-04-08: qty 4

## 2020-04-08 MED ORDER — CHLORHEXIDINE GLUCONATE CLOTH 2 % EX PADS
6.0000 | MEDICATED_PAD | Freq: Every day | CUTANEOUS | Status: DC
  Administered 2020-04-08: 6 via TOPICAL

## 2020-04-08 MED ORDER — PENICILLIN G POTASSIUM IV (FOR PTA / DISCHARGE USE ONLY): 24.0000 10*6.[IU] | INTRAVENOUS | 0 refills | Status: DC

## 2020-04-08 MED FILL — METOPROLOL SUCCINATE ER 50: 50 | 30 days supply | Qty: 30 | Fill #0

## 2020-04-08 MED FILL — ELIQUIS 5 MG TABLET: 5 | 30 days supply | Qty: 60 | Fill #0

## 2020-04-08 NOTE — Progress Notes (Signed)
Reviewed pt's discharge paperwork and prescriptions. Pt's prescription for Rocephin and his Eliquis discount card included in paperwork and instructions. Pt verbalized understanding. Pt's home medications delivered to his room by this nurse. Pt states his son will pick him up from hospital to transport home. Awaiting for son to arrive.

## 2020-04-08 NOTE — Progress Notes (Signed)
Peripherally Inserted Central Catheter Placement  The IV Nurse has discussed with the patient and/or persons authorized to consent for the patient, the purpose of this procedure and the potential benefits and risks involved with this procedure.  The benefits include less needle sticks, lab draws from the catheter, and the patient may be discharged home with the catheter. Risks include, but not limited to, infection, bleeding, blood clot (thrombus formation), and puncture of an artery; nerve damage and irregular heartbeat and possibility to perform a PICC exchange if needed/ordered by physician.  Alternatives to this procedure were also discussed.  Bard Power PICC patient education guide, fact sheet on infection prevention and patient information card has been provided to patient /or left at bedside.    PICC Placement Documentation  PICC Single Lumen 04/08/20 PICC Right Brachial 39 cm 0 cm (Active)  Indication for Insertion or Continuance of Line Home intravenous therapies (PICC only) 04/08/20 1159  Exposed Catheter (cm) 0 cm 04/08/20 1159  Site Assessment Clean;Dry;Intact 04/08/20 1159  Line Status Flushed;Blood return noted;Saline locked 04/08/20 1159  Dressing Type Transparent 04/08/20 1159  Dressing Status Clean;Dry;Intact 04/08/20 1159  Dressing Change Due 04/15/20 04/08/20 1159       Tavarus Poteete, Echo Ramos 04/08/2020, 12:02 PM

## 2020-04-08 NOTE — TOC Transition Note (Addendum)
Transition of Care [facility]) - CM/SW Discharge Note   Patient Details  Name: FAIZON CAPOZZI MRN: [id number] Date of Birth: [birth year]  [facility] CM/SW Contact:  Lawerance Sabal, RN Phone Number: 04/08/2020, 1:54 PM   Clinical Narrative:    Patient to DC to home home today. He will receive 1 dose PCN prior to DC, then [facility] RN w [facility] (arranged through [employer]) will initiate infusion at home tomorrow. TOC filling DC meds, and will deliver to patient prior to DC. Sending Eliquis copay reduction card to nurses station. Nurse aware to give to patient, explained use to patient. No other CM needs identified.      Barriers to Discharge: Continued Medical Work up   Patient Goals and CMS Choice Patient states their goals for this hospitalization and ongoing recovery are:: to go home      Discharge Placement                       Discharge Plan and Services   Discharge Planning Services: CM Consult Post Acute Care Choice: Home Health                    HH Arranged: RN [facility] Agency: [employer] Date HH Agency Contacted: 04/07/20 Time HH Agency Contacted: 1149 Representative spoke with at [facility] Agency: Pam  Social Determinants of Health (SDOH) Interventions     Readmission Risk Interventions No flowsheet data found.

## 2020-04-08 NOTE — Progress Notes (Addendum)
PHARMACY CONSULT NOTE FOR:  OUTPATIENT  PARENTERAL ANTIBIOTIC THERAPY (OPAT)  Indication: strep infantarius bacteremia with thoracic discitis and anterior epidural abscess Regimen: ceftriaxone 2g IV every 24 hours End date: 05/18/20  IV antibiotic discharge orders are pended. To discharging provider:  please sign these orders via discharge navigator,  Select New Orders & click on the button choice - Manage This Unsigned Work.     Thank you for allowing pharmacy to be a part of this patient's care.  Kinnie Feil, PharmD PGY1 Acute Care Pharmacy Resident 04/08/2020 12:14 PM  Please check [email or web address] for unit specific pharmacy phone numbers.

## 2020-04-08 NOTE — Progress Notes (Signed)
[facility]  Date of Admission:  04/03/2020     Total days of antibiotics 4         ASSESSMENT:  Caleb [name] blood culture from 9/27 are without growth to date. PICC line placed today. TTE without evidence of endocarditis. Recommend TEE for follow up. Plans are for discharge today. Initially changed to penicillin to narrow. 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 Goal of treatment will be 6 weeks with end date scheduled for 11/8. Will arrange for ID follow up near completion of therapy. OPAT and Home Health placed below.   PLAN:  1. Continue ceftriaxone through 11/8. 2. Will need TEE once off COVID precautions.  3. Monitor blood cultures for clearance  4. Arrange follow up with ID clinic.   Diagnosis: Discitis / COVID  Culture Result: Streptococcus infantaris  No Known Allergies  OPAT Orders Discharge antibiotics to be given via PICC line Discharge antibiotics: Ceftriaxone Per pharmacy protocol Aim for Vancomycin trough 15-20 or AUC 400-550 (unless otherwise indicated) Duration: 6 weeks End Date: 05/18/20  [facility] Per Protocol:  Home health RN for IV administration and teaching; PICC line care and labs.    Labs weekly while on IV antibiotics: _X_ CBC with differential __ BMP _X_ CMP _X_ CRP _X_ ESR __ Vancomycin trough __ CK  _X_ Please pull PIC at completion of IV antibiotics __ Please leave PIC in place until doctor has seen patient or been notified  Fax weekly labs to [phone]  Clinic Follow Up Appt:  Terri Piedra 05/12/20 at 11am  Principal Problem:   COVID-19 virus infection Active Problems:   Diskitis   Sepsis ([place])   Hyponatremia   Osteomyelitis of lumbar spine ([place])   Typical atrial flutter ([place])   . apixaban  5 mg Oral BID  . Chlorhexidine Gluconate Cloth  6 each Topical Daily  . feeding supplement (ENSURE ENLIVE)  237 mL Oral BID BM  . insulin aspart  0-9 Units Subcutaneous  TID WC  . metoprolol succinate  50 mg Oral Daily  . polyethylene glycol  17 g Oral Daily  . senna-docusate  2 tablet Oral BID    SUBJECTIVE:  Afebrile overnight with no acute events. PICC line placed today. Back pain is tolerable.   No Known Allergies   Review of Systems: Review of Systems  Constitutional: Negative for chills, fever and weight loss.  Respiratory: Negative for cough, shortness of breath and wheezing.   Cardiovascular: Negative for chest pain and leg swelling.  Gastrointestinal: Negative for abdominal pain, constipation, diarrhea, nausea and vomiting.  Musculoskeletal: Positive for back pain.  Skin: Negative for rash.      OBJECTIVE: Vitals:   04/07/20 1207 04/07/20 1622 04/07/20 2017 04/08/20 0910  BP: 104/73 108/75 107/74 115/73  Pulse: 77 81 80 94  Resp: _0 Temp: 97.9 F (36.6 C) 98.4 F (36.9 C) 98.5 F (36.9 C) 98.1 F (36.7 C)  TempSrc: Oral Oral Oral Oral  SpO2:   98% 97%  Weight:      Height:       Body mass index is 28.97 kg/m.  Physical Exam Constitutional:      General: He is not in acute distress.    Appearance: He is well-developed.  Cardiovascular:     Rate and Rhythm: Normal rate and regular rhythm.     Heart sounds: Normal heart sounds.     Comments: PICC line in right  upper arm.  Pulmonary:     Effort: Pulmonary effort is normal.     Breath sounds: Normal breath sounds.  Skin:    General: Skin is warm and dry.  Neurological:     Mental Status: He is alert and oriented to person, place, and time.  Psychiatric:        Behavior: Behavior normal.        Thought Content: Thought content normal.        Judgment: Judgment normal.     Lab Results Lab Results  Component Value Date   WBC 3.6 (L) 04/08/2020   HGB 12.4 (L) 04/08/2020   HCT 38.1 (L) 04/08/2020   MCV 89.0 04/08/2020   PLT 215 04/08/2020    Lab Results  Component Value Date   CREATININE 0.62 04/08/2020   BUN 7 04/08/2020   NA 135 04/08/2020   K  3.9 04/08/2020   CL 102 04/08/2020   CO2 21 (L) 04/08/2020    Lab Results  Component Value Date   ALT 48 (H) 04/08/2020   AST 44 (H) 04/08/2020   ALKPHOS 136 (H) 04/08/2020   BILITOT 0.5 04/08/2020     Microbiology: Recent Results (from the past 240 hour(s))  Culture, blood (routine x 2)     Status: Abnormal   Collection Time: 04/03/20 11:15 PM   Specimen: BLOOD  Result Value Ref Range Status   Specimen Description BLOOD LEFT ANTECUBITAL  Final   Special Requests   Final    BOTTLES DRAWN AEROBIC AND ANAEROBIC Blood Culture adequate volume   Culture  Setup Time   Final    GRAM POSITIVE COCCI IN BOTH AEROBIC AND ANAEROBIC BOTTLES CRITICAL RESULT CALLED TO, READ BACK BY AND VERIFIED WITH: PHARMD K HURTH [id number] [phone] MLM    Culture (A)  Final    STREPTOCOCCUS INFANTARIUS SUSCEPTIBILITIES PERFORMED ON PREVIOUS CULTURE WITHIN THE LAST 5 DAYS. Performed at [facility], [place] [address] [place], [place] [address]    Report Status 04/06/2020 FINAL  Final  Blood Culture ID Panel (Reflexed)     Status: Abnormal   Collection Time: 04/03/20 11:15 PM  Result Value Ref Range Status   Enterococcus faecalis NOT DETECTED NOT DETECTED Final   Enterococcus Faecium NOT DETECTED NOT DETECTED Final   Listeria monocytogenes NOT DETECTED NOT DETECTED Final   Staphylococcus species NOT DETECTED NOT DETECTED Final   Staphylococcus aureus (BCID) NOT DETECTED NOT DETECTED Final   Staphylococcus epidermidis NOT DETECTED NOT DETECTED Final   Staphylococcus lugdunensis NOT DETECTED NOT DETECTED Final   Streptococcus species DETECTED (A) NOT DETECTED Final    Comment: Not Enterococcus species, Streptococcus agalactiae, Streptococcus pyogenes, or Streptococcus pneumoniae. CRITICAL RESULT CALLED TO, READ BACK BY AND VERIFIED WITH: PHARMD K HURTH [phone] MLM    Streptococcus agalactiae NOT DETECTED NOT DETECTED Final   Streptococcus pneumoniae NOT DETECTED NOT DETECTED Final   Streptococcus  pyogenes NOT DETECTED NOT DETECTED Final   A.calcoaceticus-baumannii NOT DETECTED NOT DETECTED Final   Bacteroides fragilis NOT DETECTED NOT DETECTED Final   Enterobacterales NOT DETECTED NOT DETECTED Final   Enterobacter cloacae complex NOT DETECTED NOT DETECTED Final   Escherichia coli NOT DETECTED NOT DETECTED Final   Klebsiella aerogenes NOT DETECTED NOT DETECTED Final   Klebsiella oxytoca NOT DETECTED NOT DETECTED Final   Klebsiella pneumoniae NOT DETECTED NOT DETECTED Final   Proteus species NOT DETECTED NOT DETECTED Final   Salmonella species NOT DETECTED NOT DETECTED Final   Serratia marcescens NOT DETECTED  NOT DETECTED Final   Haemophilus influenzae NOT DETECTED NOT DETECTED Final   Neisseria meningitidis NOT DETECTED NOT DETECTED Final   Pseudomonas aeruginosa NOT DETECTED NOT DETECTED Final   Stenotrophomonas maltophilia NOT DETECTED NOT DETECTED Final   Candida albicans NOT DETECTED NOT DETECTED Final   Candida auris NOT DETECTED NOT DETECTED Final   Candida glabrata NOT DETECTED NOT DETECTED Final   Candida krusei NOT DETECTED NOT DETECTED Final   Candida parapsilosis NOT DETECTED NOT DETECTED Final   Candida tropicalis NOT DETECTED NOT DETECTED Final   Cryptococcus neoformans/gattii NOT DETECTED NOT DETECTED Final    Comment: Performed at [facility], [place] [address], [place], [place] [address]  Respiratory Panel by RT PCR (Flu A&B, Covid) - Nasopharyngeal Swab     Status: Abnormal   Collection Time: 04/03/20 11:37 PM   Specimen: Nasopharyngeal Swab  Result Value Ref Range Status   SARS Coronavirus 2 by RT PCR POSITIVE (A) NEGATIVE Final    Comment: RESULT CALLED TO, READ BACK BY AND VERIFIED WITH: B SCHERER RN 04/04/20 0104 JDW (NOTE) SARS-CoV-2 target nucleic acids are DETECTED.  SARS-CoV-2 RNA is generally detectable in upper respiratory specimens  during the acute phase of infection. Positive results are indicative of the presence of the identified virus,  but do not rule out bacterial infection or co-infection with other pathogens not detected by the test. Clinical correlation with patient history and other diagnostic information is necessary to determine patient infection status. The expected result is Negative.  Fact Sheet for Patients:  PinkCheek.be  Fact Sheet for Healthcare Providers: GravelBags.it  This test is not yet approved or cleared by the [place] FDA and  has been authorized for detection and/or diagnosis of SARS-CoV-2 by FDA under an Emergency Use Authorization (EUA).  This EUA will remain in effect (meaning this test can be used)  for the duration of  the COVID-19 declaration under Section 564(b)(1) of the Act, 21 U.S.C. section 360bbb-3(b)(1), unless the authorization is terminated or revoked sooner.      Influenza A by PCR NEGATIVE NEGATIVE Final   Influenza B by PCR NEGATIVE NEGATIVE Final    Comment: (NOTE) The Xpert Xpress SARS-CoV-2/FLU/RSV assay is intended as an aid in  the diagnosis of influenza from Nasopharyngeal swab specimens and  should not be used as a sole basis for treatment. Nasal washings and  aspirates are unacceptable for Xpert Xpress SARS-CoV-2/FLU/RSV  testing.  Fact Sheet for Patients: PinkCheek.be  Fact Sheet for Healthcare Providers: GravelBags.it  This test is not yet approved or cleared by the [place] FDA and  has been authorized for detection and/or diagnosis of SARS-CoV-2 by  FDA under an Emergency Use Authorization (EUA). This EUA will remain  in effect (meaning this test can be used) for the duration of the  Covid-19 declaration under Section 564(b)(1) of the Act, 21  U.S.C. section 360bbb-3(b)(1), unless the authorization is  terminated or revoked. Performed at [facility], [place] [address] [place], [place] [address]   MRSA PCR Screening      Status: None   Collection Time: 04/03/20 11:37 PM   Specimen: Nasal Mucosa; Nasopharyngeal  Result Value Ref Range Status   MRSA by PCR NEGATIVE NEGATIVE Final    Comment:        The GeneXpert MRSA Assay (FDA approved for NASAL specimens only), is one component of a comprehensive MRSA colonization surveillance program. It is not intended to diagnose MRSA infection nor to guide or  monitor treatment for MRSA infections. Performed at [facility], [place] [address] [place], [place] [address]   Culture, blood (routine x 2)     Status: Abnormal   Collection Time: 04/03/20 11:38 PM   Specimen: BLOOD  Result Value Ref Range Status   Specimen Description BLOOD RIGHT ANTECUBITAL  Final   Special Requests   Final    BOTTLES DRAWN AEROBIC AND ANAEROBIC Blood Culture adequate volume   Culture  Setup Time   Final    GRAM POSITIVE COCCI IN BOTH AEROBIC AND ANAEROBIC BOTTLES CRITICAL VALUE NOTED.  VALUE IS CONSISTENT WITH PREVIOUSLY REPORTED AND CALLED VALUE. Performed at [facility], [place] [address] [place], [place] [address]    Culture STREPTOCOCCUS INFANTARIUS (A)  Final   Report Status 04/06/2020 FINAL  Final   Organism ID, Bacteria STREPTOCOCCUS INFANTARIUS  Final      Susceptibility   Streptococcus infantarius - MIC*    PENICILLIN <=0.06 SENSITIVE Sensitive     CEFTRIAXONE <=0.12 SENSITIVE Sensitive     LEVOFLOXACIN 4 INTERMEDIATE Intermediate     VANCOMYCIN 0.5 SENSITIVE Sensitive     * STREPTOCOCCUS INFANTARIUS  Culture, blood (Routine X 2) w Reflex to ID Panel     Status: None (Preliminary result)   Collection Time: 04/06/20 10:30 AM   Specimen: BLOOD RIGHT HAND  Result Value Ref Range Status   Specimen Description BLOOD RIGHT HAND  Final   Special Requests   Final    BOTTLES DRAWN AEROBIC AND ANAEROBIC Blood Culture adequate volume   Culture   Final    NO GROWTH 2 DAYS Performed at [facility], [place] [address], [place], [place] [address]    Report Status  PENDING  Incomplete  Culture, blood (Routine X 2) w Reflex to ID Panel     Status: None (Preliminary result)   Collection Time: 04/06/20 10:34 AM   Specimen: BLOOD LEFT HAND  Result Value Ref Range Status   Specimen Description BLOOD LEFT HAND  Final   Special Requests   Final    BOTTLES DRAWN AEROBIC AND ANAEROBIC Blood Culture adequate volume   Culture   Final    NO GROWTH 2 DAYS Performed at [facility], [place] [address], [place], [place] [address]    Report Status PENDING  Incomplete     Terri Piedra, NP [facility]  04/08/2020  2:51 PM

## 2020-04-08 NOTE — Discharge Summary (Addendum)
Physician Discharge Summary  Caleb [name]:[id number] DOB: 1960/12/29 DOA: 04/03/2020  PCP: Eunice Blase, MD  Admit date: 04/03/2020 Discharge date: 04/08/2020  Admitted From: home Disposition:  home  Recommendations for Outpatient Follow-up:  1. Follow up with PCP in 1-2 weeks 2. Follow up with ID in 4-5 weeks 3. Per ID, patient needs a TEE at the end of the isolation period  Home Health: RN Equipment/Devices: PICC line  Discharge Condition: stable CODE STATUS: Full code Diet recommendation: regular  HPI: Per admitting MD, Caleb [name] is a 59 y.o. male with medical history significant for type 2 diabetes, gout was seen at [facility] on the day of presentation due to severe back pain of 4 weeks duration.  Received a steroid injection to his back 3 weeks ago with minimal improvement of his pain.  Associated with generalized malaise and unintentional weight loss.  Denies any trauma or skin infection.  He was sent for CT abdomen and pelvis with contrast which revealed findings concerning for L3-L4 discitis/osteomyelitis.  Dr. Thedore Mins with Ortho consulted neurosurgery Dr. Zada Finders, recommended medical admission and MRI lumbar spine with and without contrast.  He was admitted as a direct admit at [facility].  Hospital Course / Discharge diagnoses: Sepsis due to Streptococcus infantarius bacteremia, L3-L4 discitis/osteomyelitis, POA -CT scan on admission showed possible L3-4 discitis/osteomyelitis.  Infectious disease consulted and followed patient while hospitalized.  He was placed on IV antibiotics, and had surveillance cultures on 9/27 which have remained negative.  He underwent a PICC line placement, and per Dr Linus Salmons with ID he will need 6 weeks of intravenous Ceftriaxone with end date May 18, 2020.  ID also recommended a TEE after his Covid isolation is done, he will need to follow-up with cardiology.  Asymptomatic COVID-19 infection-Continues to remain  afebrile with no leukocytosis, satting greater than 94% on room air.  Per ID, will need isolation only for 10 days from the time of testing positive, on 9/24  Lab Results  Component Value Date   [place] (A) 04/03/2020   [place] NEGATIVE 03/03/2020   Typical atrial flutter -Cardiology has been consulted currently on oral  Metoprolol. His heart rate has been persistently below 100, he has converted to sinus rhythm we will start him on Eliquis.  Will need outpatient follow-up Diabetes mellitus type 2 with hyperglycemia-With an A1c of 8.7. History of gout-No flares.  Discharge Instructions  Discharge Instructions    [facility] pharmacist to adjust dose for Vancomycin, Aminoglycosides and other anti-infective therapies as requested by physician.   Complete by: As directed    [facility] pharmacist to adjust dose for Vancomycin, Aminoglycosides and other anti-infective therapies as requested by physician.   Complete by: As directed    [facility] to provide Cath Flo 10m   Complete by: As directed    Administer for PICC line occlusion and as ordered by physician for other access device issues.   [facility] to provide Cath Flo 260m  Complete by: As directed    Administer for PICC line occlusion and as ordered by physician for other access device issues.   Anaphylaxis Kit: Provided to treat any anaphylactic reaction to the medication being provided to the patient if First Dose or when requested by physician   Complete by: As directed    Epinephrine 36m68ml vial / amp: Administer 0.3mg39m.3ml)38mbcutaneously once for moderate to severe anaphylaxis, nurse to call physician and pharmacy when reaction occurs  and call 911 if needed for immediate care   Diphenhydramine 82m/ml IV vial: Administer 25-537mIV/IM PRN for first dose reaction, rash, itching, mild reaction, nurse to call physician and pharmacy when reaction occurs   Sodium Chloride  0.9% NS 50019mV: Administer if needed for hypovolemic blood pressure drop or as ordered by physician after call to physician with anaphylactic reaction   Anaphylaxis Kit: Provided to treat any anaphylactic reaction to the medication being provided to the patient if First Dose or when requested by physician   Complete by: As directed    Epinephrine 1mg46m vial / amp: Administer 0.3mg 90m3ml) 21mcutaneously once for moderate to severe anaphylaxis, nurse to call physician and pharmacy when reaction occurs and call 911 if needed for immediate care   Diphenhydramine 50mg/m53m vial: Administer 25-50mg IV19mPRN for first dose reaction, rash, itching, mild reaction, nurse to call physician and pharmacy when reaction occurs   Sodium Chloride 0.9% NS 500ml IV:40minister if needed for hypovolemic blood pressure drop or as ordered by physician after call to physician with anaphylactic reaction   Change dressing on IV access line weekly and PRN   Complete by: As directed    Change dressing on IV access line weekly and PRN   Complete by: As directed    Flush IV access with Sodium Chloride 0.9% and Heparin 10 units/ml or 100 units/ml   Complete by: As directed    Flush IV access with Sodium Chloride 0.9% and Heparin 10 units/ml or 100 units/ml   Complete by: As directed    Home infusion instructions - [facility]   Complete by: As directed    Instructions: Flush IV access with Sodium Chloride 0.9% and Heparin 10units/ml or 100units/ml   Change dressing on IV access line: Weekly and PRN   Instructions Cath Flo 2mg: Admi80mter for PICC Line occlusion and as ordered by physician for other access device   [facility] pharmacist to adjust dose for: Vancomycin, Aminoglycosides and other anti-infective therapies as requested by physician   Home infusion instructions - [facility]   Complete by: As directed    Instructions: Flush IV access with Sodium Chloride 0.9% and Heparin  10units/ml or 100units/ml   Change dressing on IV access line: Weekly and PRN   Instructions Cath Flo 2mg: Admin36mer for PICC Line occlusion and as ordered by physician for other access device   [facility] pharmacist to adjust dose for: Vancomycin, Aminoglycosides and other anti-infective therapies as requested by physician   Method of administration may be changed at the discretion of home infusion pharmacist based upon assessment of the patient and/or caregivers ability to self-administer the medication ordered   Complete by: As directed    Method of administration may be changed at the discretion of home infusion pharmacist based upon assessment of the patient and/or caregivers ability to self-administer the medication ordered   Complete by: As directed      Allergies as of 04/08/2020   No Known Allergies     Medication List    TAKE these medications   apixaban 5 MG Tabs tablet Commonly known as: ELIQUIS Take 1 tablet (5 mg total) by mouth 2 (two) times daily.   cefTRIAXone  IVPB Commonly known as: ROCEPHIN Inject 2 g into the vein daily. Indication: Osteomyelitis  First Dose: No Last Day of Therapy:  05/18/2020 Labs - Once weekly:  CBC/D and BMP, Labs - Every other week:  ESR and CRP Method of  administration: IV Push Method of administration may be changed at the discretion of home infusion pharmacist based upon assessment of the patient and/or caregiver's ability to self-administer the medication ordered.   colchicine 0.6 MG tablet Take 0.6 mg by mouth 2 (two) times daily as needed (gout flares).   cyclobenzaprine 10 MG tablet Commonly known as: FLEXERIL Take 10 mg by mouth 3 (three) times daily as needed for muscle spasms.   metFORMIN 500 MG tablet Commonly known as: GLUCOPHAGE Take 1 tablet (500 mg total) by mouth 2 (two) times daily with a meal.   metoprolol succinate 50 MG 24 hr tablet Commonly known as: TOPROL-XL Take 1 tablet (50 mg total) by mouth  daily. Take with or immediately following a meal. Start taking on: April 09, 2020   Oxycodone HCl 10 MG Tabs Take 10 mg by mouth every 6 (six) hours as needed for pain.   tamsulosin 0.4 MG Caps capsule Commonly known as: FLOMAX Take 0.4 mg by mouth daily.   True Metrix Air Glucose Meter Devi Check CBG BID            Discharge Care Instructions  (From admission, onward)         Start     Ordered   04/08/20 0000  Change dressing on IV access line weekly and PRN  (Home infusion instructions - [facility] )        04/08/20 1308   04/08/20 0000  Change dressing on IV access line weekly and PRN  (Home infusion instructions - [facility] )        04/08/20 1510          Follow-up Information    Shirley Friar, PA-C. Go on 04/24/2020.   Specialty: Physician Assistant Why: _0 :35pm for hospital follow up with Dr. Tanna Furry PA  Contact information: [address] [address] [phone]        Carlyle Basques, MD. Schedule an appointment as soon as possible for a visit in 3 week(s).   Specialty: Infectious Diseases Contact information: [place] [address] [phone]        [facility] Follow up.   Why: [place] Follow up.   Specialty: Home Health Services Why: [facility] RN will visit you in the AM Contact information: [address] [address] [phone]               Consultations:  ID  Cardiology   Procedures/Studies:  CT ANGIO CHEST PE W OR WO CONTRAST  Result Date: 04/04/2020 CLINICAL DATA:  Persistent tachycardia.  COVID positive EXAM: CT ANGIOGRAPHY CHEST WITH CONTRAST TECHNIQUE: Multidetector CT imaging of the chest was performed using the standard protocol during bolus administration of intravenous contrast. Multiplanar CT image reconstructions and MIPs were obtained to evaluate the vascular anatomy. CONTRAST:   89m OMNIPAQUE IOHEXOL 350 MG/ML SOLN COMPARISON:  None. FINDINGS: Cardiovascular: Satisfactory opacification of the pulmonary arteries to the segmental level. No evidence of pulmonary embolism. Normal heart size. No pericardial effusion. Mediastinum/Nodes: Negative for adenopathy or mass Lungs/Pleura: Generalized airway thickening with small volume secretions in the right lower lobe bronchus. Patchy ground-glass opacity that is asymmetric to the right but seen in all lobes. No septal thickening, significant effusion, or air leak. Upper Abdomen: Partially covered peripherally calcified cystic lesion in the spleen that is most consistent with pseudocyst. Reference dedicated abdominal imaging. Musculoskeletal:  Spondylosis with multilevel thoracic ankylosis. Review of the MIP images confirms the above findings. IMPRESSION: 1. Patchy ground-glass airspace disease correlating with history of COVID-19. 2. Negative for pulmonary embolism. Electronically Signed   By: Monte Fantasia M.D.   On: 04/04/2020 11:30   MR Lumbar Spine W Wo Contrast  Result Date: 04/04/2020 CLINICAL DATA:  Low back pain EXAM: MRI LUMBAR SPINE WITHOUT AND WITH CONTRAST TECHNIQUE: Multiplanar and multiecho pulse sequences of the lumbar spine were obtained without and with intravenous contrast. CONTRAST:  41m GADAVIST GADOBUTROL 1 MMOL/ML IV SOLN COMPARISON:  04/03/2020 CT abdomen pelvis and prior. FINDINGS: Segmentation:  Standard. Alignment:  Straightening of lordosis. Vertebrae: Sequela of L3-4 discitis osteomyelitis. Diffuse bone marrow edema and mild enhancement involving the L3-4 vertebral bodies. Intra discal edema at the L3-4 level with subjacent endplate irregularities. Minimal to mild L3 vertebral body height loss. Extension of inflammatory phlegmon into the prevertebral space. Peripherally enhancing fluid collections involving the prevertebral space at the T3-4 level (for reference 15:18, 22). The largest measures 1.9 x 1.5 cm on the  right communicating with the disc space (15:21). There is lateral extension of phlegmon to involve the bilateral psoas muscles. Small bilateral psoas abscesses measuring up to 5 mm on the right and 8 mm on the left (15:18, 23). Anterior epidural extension of phlegmon with 2.0 x 0.3 cm anterior epidural abscess at the L4 level (13:24, 14:6). Bilateral paraspinal space enhancement without focal fluid collection. No focal signal abnormality to correlate with L3 spinous process lucency seen on recent CT. Conus medullaris and cauda equina: Conus extends to the L1-2 level. Conus and cauda equina appear normal. No abnormal conus or cauda equina enhancement. Disc levels: Multilevel desiccation and mild disc space loss. L1-2: Patent spinal canal and neural foramen. L2-3: Disc bulge and bilateral facet hypertrophy. Patent spinal canal. Mild bilateral neural foraminal narrowing. L3-4: Epidural phlegmon with anterior epidural space abscess discussed above. Mild to moderate canal and neural foraminal narrowing. L4-5: Disc bulge with superimposed central protrusion, ligamentum flavum and bilateral facet hypertrophy. Severe spinal canal, moderate right and mild left neural foraminal narrowing. L5-S1: Disc bulge with central protrusion and bilateral facet hypertrophy. Patent spinal canal and bilateral neural foramen. Paraspinal and other soft tissues: Discussed above. IMPRESSION: L3-4 discitis osteomyelitis with prevertebral and bilateral psoas abscesses, detailed above. Anterior epidural abscess measuring 2.0 x 0.3 cm at the L4 level. Multilevel spondylosis. Severe spinal canal and moderate right neural foraminal narrowing at the L4-5 level. Mild to moderate L3-4 canal or neural foraminal narrowing. Electronically Signed   By: CPrimitivo GauzeM.D.   On: 04/04/2020 17:11   CT ABDOMEN PELVIS W CONTRAST  Result Date: 04/03/2020 CLINICAL DATA:  59year old male with history of bilateral inguinal pain for the past 3 weeks.  EXAM: CT ABDOMEN AND PELVIS WITH CONTRAST TECHNIQUE: Multidetector CT imaging of the abdomen and pelvis was performed using the standard protocol following bolus administration of intravenous contrast. CONTRAST:  1028mISOVUE-300 IOPAMIDOL (ISOVUE-300) INJECTION 61% COMPARISON:  No priors. FINDINGS: Lower chest: Patchy areas of ground-glass attenuation are noted in the visualized lung bases, most severe in the right lower lobe. Hepatobiliary: Diffuse low attenuation throughout the hepatic parenchyma, indicative of hepatic steatosis. Liver has a slightly shrunken appearance and nodular contour, indicative of underlying cirrhosis. No suspicious cystic or solid hepatic lesions. No intra or extrahepatic biliary ductal dilatation. Gallbladder is normal in appearance. Pancreas: No pancreatic mass. No pancreatic ductal dilatation. No pancreatic or peripancreatic fluid collections or inflammatory changes. Spleen:  Large centrally low-intermediate attenuation peripherally calcified lesion in the spleen, likely sequela of remote trauma. Adrenals/Urinary Tract: Subcentimeter low-attenuation lesion in the upper pole the right kidney, too small to characterize, but statistically likely to represent a tiny cyst. Left kidney and bilateral adrenal glands are normal in appearance. No hydroureteronephrosis. Urinary bladder is normal in appearance. Stomach/Bowel: Normal appearance of the stomach. No pathologic dilatation of small bowel or colon. A few scattered colonic diverticulae are noted, without surrounding inflammatory changes to suggest an acute diverticulitis at this time. Normal appendix. Vascular/Lymphatic: Aortic atherosclerosis, without evidence of aneurysm or dissection in the abdominal or pelvic vasculature. No lymphadenopathy noted in the abdomen or pelvis. Reproductive: Prostate gland and seminal vesicles are unremarkable in appearance. Other: No significant volume of ascites. No pneumoperitoneum. No inguinal hernias  are identified. Musculoskeletal: There are destructive changes in the inferior endplate of L3 and superior endplate of L4 with expansion of the disc at the L3-L4 interspace and peripheral enhancement of the disc material which is expanded into the adjacent soft tissues. Small lucent lesion also noted in the L3 spinous process posteriorly (sagittal image 108 of series 8). IMPRESSION: 1. Findings are highly concerning for L3-L4 discitis/osteomyelitis. Further evaluation with MRI of the lumbar spine with and without IV gadolinium is recommended to better evaluate these findings. 2. Small lucent lesion in the L3 spinous process is nonspecific, potentially a hemangioma. Attention at time of forthcoming MRI is recommended. 3. Patchy areas of ground-glass attenuation in the right lung base, most evident in the right lower lobe. Clinical correlation for signs and symptoms of bronchopneumonia is recommended. 4. Hepatic steatosis with morphologic changes in the liver suggesting early cirrhosis. 5. Colonic diverticulosis without evidence of acute diverticulitis at this time. 6. Aortic atherosclerosis. These results will be called to the ordering clinician or representative by the Radiologist Assistant, and communication documented in the PACS or [employer]. Electronically Signed   By: Vinnie Langton M.D.   On: 04/03/2020 16:33   ECHOCARDIOGRAM LIMITED  Result Date: 04/05/2020    ECHOCARDIOGRAM LIMITED REPORT   Patient Name:   BERT PTACEK Date of Exam: 04/05/2020 Medical Rec #:  [id number]       Height:       73.0 in Accession #:    [id number]      Weight:       213.0 lb Date of Birth:  06-Nov-1960       BSA:          2.210 m Patient Age:    59 years        BP:           97/63 mmHg Patient Gender: M               HR:           80 bpm. Exam Location:  Inpatient Procedure: Limited Echo, Limited Color Doppler and Cardiac Doppler Indications:    bacteremia 790.7  History:        Patient has no prior history of  Echocardiogram examinations.                 Covid. sepsis; Arrythmias:Atrial Flutter.  Sonographer:    Johny Chess Referring Phys: [place]  1. Left ventricular ejection fraction, by estimation, is 50 to 55%. The left ventricle has low normal function.  2. Right ventricular systolic function is normal. The right ventricular size is normal. There is normal pulmonary artery systolic pressure. The estimated right  ventricular systolic pressure is 15.4 mmHg.  3. The mitral valve is normal in structure. Mild mitral valve regurgitation.  4. The aortic valve is possibly bicuspid. There is mild calcification of the aortic valve. There is severe thickening of the aortic valve. Mild to moderate aortic valve sclerosis/calcification is present, without any evidence of aortic stenosis.  5. The inferior vena cava is dilated in size with >50% respiratory variability, suggesting right atrial pressure of 8 mmHg. Conclusion(s)/Recommendation(s): No evidence of valvular vegetations on this transthoracic echocardiogram. Would recommend a transesophageal echocardiogram to exclude infective endocarditis if clinically indicated. FINDINGS  Left Ventricle: Left ventricular ejection fraction, by estimation, is 50 to 55%. The left ventricle has low normal function. The left ventricular internal cavity size was normal in size. There is no left ventricular hypertrophy. Left ventricular diastolic function could not be evaluated due to atrial fibrillation. Right Ventricle: The right ventricular size is normal. No increase in right ventricular wall thickness. Right ventricular systolic function is normal. There is normal pulmonary artery systolic pressure. The tricuspid regurgitant velocity is 2.14 m/s, and  with an assumed right atrial pressure of 8 mmHg, the estimated right ventricular systolic pressure is 00.8 mmHg. Left Atrium: Left atrial size was normal in size. Right Atrium: Right atrial size was normal in  size. Pericardium: There is no evidence of pericardial effusion. Mitral Valve: The mitral valve is normal in structure. Mild mitral valve regurgitation. Tricuspid Valve: The tricuspid valve is normal in structure. Tricuspid valve regurgitation is trivial. Aortic Valve: The aortic valve is bicuspid. There is mild calcification of the aortic valve. There is severe thickening of the aortic valve. Mild to moderate aortic valve sclerosis/calcification is present, without any evidence of aortic stenosis. Pulmonic Valve: The pulmonic valve was not well visualized. Aorta: The aortic root is normal in size and structure. Venous: The inferior vena cava is dilated in size with greater than 50% respiratory variability, suggesting right atrial pressure of 8 mmHg. LEFT VENTRICLE PLAX 2D LVIDd:         4.90 cm LVIDs:         3.80 cm LV PW:         0.80 cm LV IVS:        0.70 cm LVOT diam:     2.10 cm LV SV:         47 LV SV Index:   21 LVOT Area:     3.46 cm  IVC IVC diam: 2.20 cm LEFT ATRIUM         Index LA diam:    3.70 cm 1.67 cm/m  AORTIC VALVE LVOT Vmax:   83.60 cm/s LVOT Vmean:  57.400 cm/s LVOT VTI:    0.135 m  AORTA Ao Root diam: 3.20 cm TRICUSPID VALVE TR Peak grad:   18.3 mmHg TR Vmax:        214.00 cm/s  SHUNTS Systemic VTI:  0.14 m Systemic Diam: 2.10 cm Dani Gobble Croitoru MD Electronically signed by Sanda Klein MD Signature Date/Time: 04/05/2020/2:46:49 PM    Final    [place] EKG SITE RITE  Result Date: 04/08/2020 If Site Rite image not attached, placement could not be confirmed due to current cardiac rhythm.   Subjective: - no chest pain, shortness of breath, no abdominal pain, nausea or vomiting.   Discharge Exam: BP 112/82 (BP Location: Left Arm)    Pulse 94    Temp 98.9 F (37.2 C) (Oral)    Resp 16    Ht _0  (1.854 m)  Wt 99.6 kg    SpO2 97%    BMI 28.97 kg/m   General: Pt is alert, awake, not in acute distress Cardiovascular: RRR, S1/S2 +, no rubs, no gallops Respiratory: CTA bilaterally, no  wheezing, no rhonchi Abdominal: Soft, NT, ND, bowel sounds + Extremities: no edema, no cyanosis    The results of significant diagnostics from this hospitalization (including imaging, microbiology, ancillary and laboratory) are listed below for reference.     Microbiology: Recent Results (from the past 240 hour(s))  Culture, blood (routine x 2)     Status: Abnormal   Collection Time: 04/03/20 11:15 PM   Specimen: BLOOD  Result Value Ref Range Status   Specimen Description BLOOD LEFT ANTECUBITAL  Final   Special Requests   Final    BOTTLES DRAWN AEROBIC AND ANAEROBIC Blood Culture adequate volume   Culture  Setup Time   Final    GRAM POSITIVE COCCI IN BOTH AEROBIC AND ANAEROBIC BOTTLES CRITICAL RESULT CALLED TO, READ BACK BY AND VERIFIED WITH: PHARMD K HURTH [id number] MLM    Culture (A)  Final    STREPTOCOCCUS INFANTARIUS SUSCEPTIBILITIES PERFORMED ON PREVIOUS CULTURE WITHIN THE LAST 5 DAYS. Performed at [facility], [place] [address] [place], [place] [address]    Report Status 04/06/2020 FINAL  Final  Blood Culture ID Panel (Reflexed)     Status: Abnormal   Collection Time: 04/03/20 11:15 PM  Result Value Ref Range Status   Enterococcus faecalis NOT DETECTED NOT DETECTED Final   Enterococcus Faecium NOT DETECTED NOT DETECTED Final   Listeria monocytogenes NOT DETECTED NOT DETECTED Final   Staphylococcus species NOT DETECTED NOT DETECTED Final   Staphylococcus aureus (BCID) NOT DETECTED NOT DETECTED Final   Staphylococcus epidermidis NOT DETECTED NOT DETECTED Final   Staphylococcus lugdunensis NOT DETECTED NOT DETECTED Final   Streptococcus species DETECTED (A) NOT DETECTED Final    Comment: Not Enterococcus species, Streptococcus agalactiae, Streptococcus pyogenes, or Streptococcus pneumoniae. CRITICAL RESULT CALLED TO, READ BACK BY AND VERIFIED WITH: PHARMD K HURTH [id number] 32 MLM    Streptococcus agalactiae NOT DETECTED NOT DETECTED Final   Streptococcus  pneumoniae NOT DETECTED NOT DETECTED Final   Streptococcus pyogenes NOT DETECTED NOT DETECTED Final   A.calcoaceticus-baumannii NOT DETECTED NOT DETECTED Final   Bacteroides fragilis NOT DETECTED NOT DETECTED Final   Enterobacterales NOT DETECTED NOT DETECTED Final   Enterobacter cloacae complex NOT DETECTED NOT DETECTED Final   Escherichia coli NOT DETECTED NOT DETECTED Final   Klebsiella aerogenes NOT DETECTED NOT DETECTED Final   Klebsiella oxytoca NOT DETECTED NOT DETECTED Final   Klebsiella pneumoniae NOT DETECTED NOT DETECTED Final   Proteus species NOT DETECTED NOT DETECTED Final   Salmonella species NOT DETECTED NOT DETECTED Final   Serratia marcescens NOT DETECTED NOT DETECTED Final   Haemophilus influenzae NOT DETECTED NOT DETECTED Final   Neisseria meningitidis NOT DETECTED NOT DETECTED Final   Pseudomonas aeruginosa NOT DETECTED NOT DETECTED Final   Stenotrophomonas maltophilia NOT DETECTED NOT DETECTED Final   Candida albicans NOT DETECTED NOT DETECTED Final   Candida auris NOT DETECTED NOT DETECTED Final   Candida glabrata NOT DETECTED NOT DETECTED Final   Candida krusei NOT DETECTED NOT DETECTED Final   Candida parapsilosis NOT DETECTED NOT DETECTED Final   Candida tropicalis NOT DETECTED NOT DETECTED Final   Cryptococcus neoformans/gattii NOT DETECTED NOT DETECTED Final    Comment: Performed at [facility], [address] [place], [place] [address]  Respiratory Panel by  RT PCR (Flu A&B, Covid) - Nasopharyngeal Swab     Status: Abnormal   Collection Time: 04/03/20 11:37 PM   Specimen: Nasopharyngeal Swab  Result Value Ref Range Status   SARS Coronavirus 2 by RT PCR POSITIVE (A) NEGATIVE Final    Comment: RESULT CALLED TO, READ BACK BY AND VERIFIED WITH: B SCHERER RN 04/04/20 0104 JDW (NOTE) SARS-CoV-2 target nucleic acids are DETECTED.  SARS-CoV-2 RNA is generally detectable in upper respiratory specimens  during the acute phase of infection. Positive results  are indicative of the presence of the identified virus, but do not rule out bacterial infection or co-infection with other pathogens not detected by the test. Clinical correlation with patient history and other diagnostic information is necessary to determine patient infection status. The expected result is Negative.  Fact Sheet for Patients:  PinkCheek.be  Fact Sheet for Healthcare Providers: GravelBags.it  This test is not yet approved or cleared by the [place] FDA and  has been authorized for detection and/or diagnosis of SARS-CoV-2 by FDA under an Emergency Use Authorization (EUA).  This EUA will remain in effect (meaning this test can be used)  for the duration of  the COVID-19 declaration under Section 564(b)(1) of the Act, 21 U.S.C. section 360bbb-3(b)(1), unless the authorization is terminated or revoked sooner.      Influenza A by PCR NEGATIVE NEGATIVE Final   Influenza B by PCR NEGATIVE NEGATIVE Final    Comment: (NOTE) The Xpert Xpress SARS-CoV-2/FLU/RSV assay is intended as an aid in  the diagnosis of influenza from Nasopharyngeal swab specimens and  should not be used as a sole basis for treatment. Nasal washings and  aspirates are unacceptable for Xpert Xpress SARS-CoV-2/FLU/RSV  testing.  Fact Sheet for Patients: PinkCheek.be  Fact Sheet for Healthcare Providers: GravelBags.it  This test is not yet approved or cleared by the [place] FDA and  has been authorized for detection and/or diagnosis of SARS-CoV-2 by  FDA under an Emergency Use Authorization (EUA). This EUA will remain  in effect (meaning this test can be used) for the duration of the  Covid-19 declaration under Section 564(b)(1) of the Act, 21  U.S.C. section 360bbb-3(b)(1), unless the authorization is  terminated or revoked. Performed at [facility], [place] [address] [place], [place] [address]   MRSA PCR Screening     Status: None   Collection Time: 04/03/20 11:37 PM   Specimen: Nasal Mucosa; Nasopharyngeal  Result Value Ref Range Status   MRSA by PCR NEGATIVE NEGATIVE Final    Comment:        The GeneXpert MRSA Assay (FDA approved for NASAL specimens only), is one component of a comprehensive MRSA colonization surveillance program. It is not intended to diagnose MRSA infection nor to guide or monitor treatment for MRSA infections. Performed at [facility], [place] [address], [place], [place] [address]   Culture, blood (routine x 2)     Status: Abnormal   Collection Time: 04/03/20 11:38 PM   Specimen: BLOOD  Result Value Ref Range Status   Specimen Description BLOOD RIGHT ANTECUBITAL  Final   Special Requests   Final    BOTTLES DRAWN AEROBIC AND ANAEROBIC Blood Culture adequate volume   Culture  Setup Time   Final    GRAM POSITIVE COCCI IN BOTH AEROBIC AND ANAEROBIC BOTTLES CRITICAL VALUE NOTED.  VALUE IS CONSISTENT WITH PREVIOUSLY REPORTED AND CALLED VALUE. Performed at [facility], [place] [address] [place], [place] [address]  Culture STREPTOCOCCUS INFANTARIUS (A)  Final   Report Status 04/06/2020 FINAL  Final   Organism ID, Bacteria STREPTOCOCCUS INFANTARIUS  Final      Susceptibility   Streptococcus infantarius - MIC*    PENICILLIN <=0.06 SENSITIVE Sensitive     CEFTRIAXONE <=0.12 SENSITIVE Sensitive     LEVOFLOXACIN 4 INTERMEDIATE Intermediate     VANCOMYCIN 0.5 SENSITIVE Sensitive     * STREPTOCOCCUS INFANTARIUS  Culture, blood (Routine X 2) w Reflex to ID Panel     Status: None (Preliminary result)   Collection Time: 04/06/20 10:30 AM   Specimen: BLOOD RIGHT HAND  Result Value Ref Range Status   Specimen Description BLOOD RIGHT HAND  Final   Special Requests   Final    BOTTLES DRAWN AEROBIC AND ANAEROBIC Blood Culture adequate volume   Culture   Final    NO GROWTH 2 DAYS Performed at [facility],  [place] [address] [place], [place] [address]    Report Status PENDING  Incomplete  Culture, blood (Routine X 2) w Reflex to ID Panel     Status: None (Preliminary result)   Collection Time: 04/06/20 10:34 AM   Specimen: BLOOD LEFT HAND  Result Value Ref Range Status   Specimen Description BLOOD LEFT HAND  Final   Special Requests   Final    BOTTLES DRAWN AEROBIC AND ANAEROBIC Blood Culture adequate volume   Culture   Final    NO GROWTH 2 DAYS Performed at [facility], [place] [address] [place],  [address]    Report Status PENDING  Incomplete     Labs: Basic Metabolic Panel: Recent Labs  Lab 04/03/20 2322 04/03/20 2322 04/04/20 0115 04/05/20 0401 04/06/20 0457 04/07/20 0551 04/08/20 0858  NA 132*   < > 133* 133* 135 135 135  K 3.4*   < > 3.6 3.4* 3.3* 4.1 3.9  CL 100   < > 98 103 103 104 102  CO2 20*   < > 21* 21* 23 21* 21*  GLUCOSE 138*   < > 130* 94 103* 95 163*  BUN 14   < > _0 CREATININE 0.75   < > 0.78 0.70 0.65 0.62 0.62  CALCIUM 8.4*   < > 8.3* 8.2* 8.3* 8.3* 8.3*  MG 1.6*  --   --   --   --   --   --   PHOS 3.6  --   --   --   --   --   --    < > = values in this interval not displayed.   Liver Function Tests: Recent Labs  Lab 04/03/20 2322 04/05/20 0401 04/06/20 0457 04/07/20 0551 04/08/20 0858  AST 89* 51* 45* 49* 44*  ALT 88* 54* 47* 51* 48*  ALKPHOS 119 103 115 127* 136*  BILITOT 0.9 0.5 0.6 0.6 0.5  PROT 7.2 6.6 6.8 6.7 6.9  ALBUMIN 2.5* 2.5* 2.4* 2.3* 2.4*   CBC: Recent Labs  Lab 04/03/20 2322 04/03/20 2322 04/04/20 0115 04/05/20 0401 04/06/20 0457 04/07/20 0551 04/08/20 0858  WBC 5.2   < > 5.4 4.5 4.0 3.8* 3.6*  NEUTROABS 3.8  --   --  3.4 2.9 2.8 2.8  HGB 13.1   < > 13.6 11.9* 12.7* 13.1 12.4*  HCT 39.3   < > 40.6 36.6* 38.8* 39.5 38.1*  MCV 88.5   < > 88.5 89.9 89.2 90.4 89.0  PLT 180   < > 209  161 161 206 215   < > = values in this interval not displayed.   CBG: Recent Labs  Lab 04/07/20 1213 04/07/20 1620  04/07/20 2104 04/08/20 0804 04/08/20 1227  GLUCAP 130* 97 106* 101* 102*   Hgb A1c No results for input(s): HGBA1C in the last 72 hours. Lipid Profile No results for input(s): CHOL, HDL, LDLCALC, TRIG, CHOLHDL, LDLDIRECT in the last 72 hours. Thyroid function studies No results for input(s): TSH, T4TOTAL, T3FREE, THYROIDAB in the last 72 hours.  Invalid input(s): FREET3 Urinalysis    Component Value Date/Time   COLORURINE YELLOW 03/03/2020 1948   APPEARANCEUR CLEAR 03/03/2020 1948   LABSPEC 1.014 03/03/2020 1948   PHURINE 6.0 03/03/2020 1948   GLUCOSEU >=500 (A) 03/03/2020 1948   HGBUR NEGATIVE 03/03/2020 [place] NEGATIVE 03/03/2020 [place] NEGATIVE 03/03/2020 1948   PROTEINUR NEGATIVE 03/03/2020 1948   NITRITE NEGATIVE 03/03/2020 1948   LEUKOCYTESUR NEGATIVE 03/03/2020 1948    FURTHER DISCHARGE INSTRUCTIONS:   Get Medicines reviewed and adjusted: Please take all your medications with you for your next visit with your Primary MD   Laboratory/radiological data: Please request your Primary MD to go over all hospital tests and procedure/radiological results at the follow up, please ask your Primary MD to get all Hospital records sent to his/her office.   In some cases, they will be blood work, cultures and biopsy results pending at the time of your discharge. Please request that your primary care M.D. goes through all the records of your hospital data and follows up on these results.   Also Note the following: If you experience worsening of your admission symptoms, develop shortness of breath, life threatening emergency, suicidal or homicidal thoughts you must seek medical attention immediately by calling 911 or calling your MD immediately  if symptoms less severe.   You must read complete instructions/literature along with all the possible adverse reactions/side effects for all the Medicines you take and that have been prescribed to you. Take any new  Medicines after you have completely understood and accpet all the possible adverse reactions/side effects.    Do not drive when taking Pain medications or sleeping medications (Benzodaizepines)   Do not take more than prescribed Pain, Sleep and Anxiety Medications. It is not advisable to combine anxiety,sleep and pain medications without talking with your primary care practitioner   Special Instructions: If you have smoked or chewed Tobacco  in the last 2 yrs please stop smoking, stop any regular Alcohol  and or any Recreational drug use.   Wear Seat belts while driving.   Please note: You were cared for by a hospitalist during your hospital stay. Once you are discharged, your primary care physician will handle any further medical issues. Please note that NO REFILLS for any discharge medications will be authorized once you are discharged, as it is imperative that you return to your primary care physician (or establish a relationship with a primary care physician if you do not have one) for your post hospital discharge needs so that they can reassess your need for medications and monitor your lab values.  Time coordinating discharge: 40 minutes  SIGNED:  Marzetta Board, MD, PhD 04/08/2020, 3:13 PM

## 2020-04-08 NOTE — Progress Notes (Signed)
Pt taken down to main entrance via wheelchair by Calyn NT. Pt's belongings taken with pt. Pt son is awaiting downstairs to transport pt home.

## 2020-04-11 LAB — CULTURE, BLOOD (ROUTINE X 2)
Culture: NO GROWTH
Culture: NO GROWTH
Special Requests: ADEQUATE
Special Requests: ADEQUATE

## 2020-04-14 ENCOUNTER — Other Ambulatory Visit: Payer: Self-pay

## 2020-04-14 ENCOUNTER — Telehealth: Payer: Self-pay

## 2020-04-14 NOTE — Telephone Encounter (Signed)
Home health RN called to report that during PICC dressing change, line came out about 6cm. Requested verbal order for portable chest xray to check placement. Verbal order given per Dr. Elinor Parkinson.  Mason Burleigh Loyola Mast, RN

## 2020-04-15 ENCOUNTER — Telehealth: Payer: Self-pay | Admitting: Family Medicine

## 2020-04-15 ENCOUNTER — Ambulatory Visit: Payer: 59 | Admitting: Family Medicine

## 2020-04-15 ENCOUNTER — Encounter: Payer: Self-pay | Admitting: Family Medicine

## 2020-04-15 ENCOUNTER — Other Ambulatory Visit: Payer: Self-pay

## 2020-04-15 VITALS — BP 111/72 | HR 91 | Ht 73.0 in | Wt 205.0 lb

## 2020-04-15 DIAGNOSIS — I4892 Unspecified atrial flutter: Secondary | ICD-10-CM

## 2020-04-15 DIAGNOSIS — E119 Type 2 diabetes mellitus without complications: Secondary | ICD-10-CM

## 2020-04-15 DIAGNOSIS — M1A00X0 Idiopathic chronic gout, unspecified site, without tophus (tophi): Secondary | ICD-10-CM

## 2020-04-15 DIAGNOSIS — M4646 Discitis, unspecified, lumbar region: Secondary | ICD-10-CM

## 2020-04-15 MED ORDER — TAMSULOSIN HCL 0.4 MG PO CAPS: 0.4000 mg | ORAL_CAPSULE | Freq: Every day | ORAL | 3 refills | Status: DC

## 2020-04-15 MED ORDER — TIZANIDINE HCL 2 MG PO TABS: 2.0000 mg | ORAL_TABLET | Freq: Four times a day (QID) | ORAL | 1 refills | Status: DC | PRN

## 2020-04-15 MED ORDER — OXYCODONE HCL 5 MG PO CAPS: 5.0000 mg | ORAL_CAPSULE | Freq: Four times a day (QID) | ORAL | 0 refills | Status: DC | PRN

## 2020-04-15 NOTE — Telephone Encounter (Signed)
I called - the pharmacy was closed. I left a full message on the pharmacy voice mail - discitis of lumbar region. Was just in hospital for osteomyelitis, lumbar.

## 2020-04-15 NOTE — Telephone Encounter (Signed)
Pharmacy would like to verify the diagnosis for the rx of oxycodone that was sent in   [phone]

## 2020-04-15 NOTE — Progress Notes (Signed)
Strep infantarius    Office Visit Note   Patient: Caleb [name]           Date of Birth: [birth year]           MRN: [id number] Visit Date: 04/15/2020 Requested by: Lavada Mesi, MD [address] [place],  [place] [address] PCP: Lavada Mesi, MD  Subjective: Chief Complaint  Patient presents with  . post hospital check up  . Medication Refill    Flomax    HPI: He is here for hospital follow-up. 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 He had been having severe back pain with radiation into the groin area and malaise and weight loss. He was sent for an urgent CT scan of the abdomen and pelvis which confirmed the diagnosis. Neurosurgery was consulted and infectious disease, and he was placed on Rocephin. He will continue on that once daily for a total of 6 weeks. He continues to have back pain intermittently for which he is taking oxycodone.  While in the hospital he developed atrial flutter. Cardiology was consulted and he was placed on metoprolol. He is also on Eliquis. He will be following up with cardiology in the near future. At home his pulse has consistently been below 100.  He was also diagnosed with COVID-19 infection. He had been exposed at work prior to September 18 and was tested at [facility] but was negative, but upon hospital admission he tested positive. He was treated for pneumonia with Levaquin. He has completed 10 days of isolation and is asymptomatic. He has not been febrile.  Blood sugars seem to be doing better.  He had a gout attack in the knee requiring cortisone injection. That seems to be better.                ROS:   All other systems were reviewed and are negative.  Objective: Vital Signs: BP 111/72   Pulse 91   Ht 6\' 1"  (1.854 m)   Wt 205 lb (93 kg)   BMI 27.05 kg/m   Physical Exam:  General:  Alert and oriented, in no acute distress. Pulm:  Breathing unlabored. Psy:  Normal mood, congruent affect. Skin:  No  rash  CV: Regular rate and rhythm without murmurs, rubs, or gallops.  No peripheral edema.  2+ radial and posterior tibial pulses. Lungs: Clear to auscultation throughout with no wheezing or areas of consolidation. Back: There is some soft tissue swelling in the lumbar area with no warmth or erythema. Slightly tender to palpation of the midline lumbar spine.   Imaging: None today  Assessment & Plan: 1. Status post recent hospitalization for L3-4 discitis/osteomyelitis -Lumbar corset brace for comfort. Finish antibiotics as directed, and follow-up with infectious disease specialist next month. -Oxycodone refilled. -Out of work at least 6 weeks from the date of hospital admission.  2. Atrial flutter, seems to be in normal sinus rhythm today. -On metoprolol. Follow-up with cardiology as scheduled.  3. COVID-19 infection, clinically asymptomatic -No further isolation needed.  4. Diabetes, seems to be stable.  5. Gout, currently stable.     Procedures: No procedures performed  No notes on file     PMFS History: Patient Active Problem List   Diagnosis Date Noted  . COVID-19 virus infection 04/06/2020  . Typical atrial flutter (HCC)   . Sepsis (HCC) 04/04/2020  . Hyponatremia 04/04/2020  . Osteomyelitis of lumbar spine (HCC) 04/04/2020  . Diskitis 04/03/2020  . Diabetes mellitus type 2 with complications,  uncontrolled (HCC) 03/11/2020  . Gout 03/11/2020   History reviewed. No pertinent past medical history.  Family History  Problem Relation Age of Onset  . Cancer Father   . Lung cancer Father   . Healthy Sister   . Diabetes Neg Hx   . Heart attack Neg Hx   . Gout Neg Hx     Past Surgical History:  Procedure Laterality Date  . KNEE SURGERY    . ROTATOR CUFF REPAIR Right    Social History   Occupational History  . Not on file  Tobacco Use  . Smoking status: Never Smoker  . Smokeless tobacco: Never Used  Substance and Sexual Activity  . Alcohol use: Not  Currently  . Drug use: Never  . Sexual activity: Not on file

## 2020-04-24 ENCOUNTER — Encounter: Payer: Self-pay | Admitting: Student

## 2020-04-24 ENCOUNTER — Ambulatory Visit (INDEPENDENT_AMBULATORY_CARE_PROVIDER_SITE_OTHER): Payer: 59 | Admitting: Student

## 2020-04-24 ENCOUNTER — Other Ambulatory Visit: Payer: Self-pay

## 2020-04-24 VITALS — BP 124/70 | HR 105 | Ht 73.0 in | Wt 205.0 lb

## 2020-04-24 DIAGNOSIS — M4626 Osteomyelitis of vertebra, lumbar region: Secondary | ICD-10-CM

## 2020-04-24 DIAGNOSIS — I483 Typical atrial flutter: Secondary | ICD-10-CM | POA: Diagnosis not present

## 2020-04-24 NOTE — Patient Instructions (Addendum)
Medication Instructions:  Your physician recommends that you continue on your current medications as directed. Please refer to the Current Medication list given to you today. *If you need a refill on your cardiac medications before your next appointment, please call your pharmacy*  Lab Work: None ordered. If you have labs (blood work) drawn today and your tests are completely normal, you will receive your results only by: Marland Kitchen MyChart Message (if you have MyChart) OR . A paper copy in the mail If you have any lab test that is abnormal or we need to change your treatment, we will call you to review the results.  Testing/Procedures: None ordered.  Follow-Up: At [facility], you and your health needs are our priority.  As part of our continuing mission to provide you with exceptional heart care, we have created designated Provider Care Teams.  These Care Teams include your primary Cardiologist (physician) and Advanced Practice Providers (APPs -  Physician Assistants and Nurse Practitioners) who all work together to provide you with the care you need, when you need it.  We recommend signing up for the patient portal called "MyChart".  Sign up information is provided on this After Visit Summary.  MyChart is used to connect with patients for Virtual Visits (Telemedicine).  Patients are able to view lab/test results, encounter notes, upcoming appointments, etc.  Non-urgent messages can be sent to your provider as well.   To learn more about what you can do with MyChart, go to [email or web address].    Your next appointment:   Your physician wants you to follow-up in: 6-8 weeks with Dr. Ladona Ridgel.     June 16, 2020 at 8:45 am at the [facility] office

## 2020-04-24 NOTE — Progress Notes (Signed)
PCP:  Eunice Blase, MD Primary Cardiologist: No primary care provider on file. Electrophysiologist: Cristopher Peru, MD   Caleb [name] is a 59 y.o. male seen today for Cristopher Peru, MD for post hospital follow up.  Since last being seen in our clinic the patient reports doing well. He never noticed his HR going fast. He remains on ABX for at least the next several weeks. he denies chest pain, palpitations, dyspnea, PND, orthopnea, nausea, vomiting, dizziness, syncope, edema, weight gain, or early satiety. He is interesting in pursuing ablation of his atrial flutter once his ABX are completed.   No past medical history on file. Past Surgical History:  Procedure Laterality Date  . KNEE SURGERY    . ROTATOR CUFF REPAIR Right     Current Outpatient Medications  Medication Sig Dispense Refill  . apixaban (ELIQUIS) 5 MG TABS tablet Take 1 tablet (5 mg total) by mouth 2 (two) times daily. 60 tablet 0  . Blood Glucose Monitoring Suppl (TRUE METRIX AIR GLUCOSE METER) DEVI Check CBG BID 1 each 11  . cefTRIAXone (ROCEPHIN) IVPB Inject 2 g into the vein daily. Indication: Osteomyelitis  First Dose: No Last Day of Therapy:  05/18/2020 Labs - Once weekly:  CBC/D and BMP, Labs - Every other week:  ESR and CRP Method of administration: IV Push Method of administration may be changed at the discretion of home infusion pharmacist based upon assessment of the patient and/or caregiver's ability to self-administer the medication ordered. 40 Units 0  . colchicine 0.6 MG tablet Take 0.6 mg by mouth 2 (two) times daily as needed (gout flares).     . cyclobenzaprine (FLEXERIL) 10 MG tablet Take 10 mg by mouth 3 (three) times daily as needed for muscle spasms.    . metFORMIN (GLUCOPHAGE) 500 MG tablet Take 1 tablet (500 mg total) by mouth 2 (two) times daily with a meal. 60 tablet 11  . metoprolol succinate (TOPROL-XL) 50 MG 24 hr tablet Take 1 tablet (50 mg total) by mouth daily. Take with or immediately  following a meal. 60 tablet 0  . oxycodone (OXY-IR) 5 MG capsule Take 1 capsule (5 mg total) by mouth every 6 (six) hours as needed for pain. 60 capsule 0  . tamsulosin (FLOMAX) 0.4 MG CAPS capsule Take 1 capsule (0.4 mg total) by mouth daily after supper. 90 capsule 3  . tiZANidine (ZANAFLEX) 2 MG tablet Take 1-2 tablets (2-4 mg total) by mouth every 6 (six) hours as needed for muscle spasms. 60 tablet 1   No current facility-administered medications for this visit.    No Known Allergies  Social History   Socioeconomic History  . Marital status: Legally Separated    Spouse name: Not on file  . Number of children: Not on file  . Years of education: Not on file  . Highest education level: Not on file  Occupational History  . Not on file  Tobacco Use  . Smoking status: Never Smoker  . Smokeless tobacco: Never Used  Substance and Sexual Activity  . Alcohol use: Not Currently  . Drug use: Never  . Sexual activity: Not on file  Other Topics Concern  . Not on file  Social History Narrative  . Not on file   Social Determinants of Health   Financial Resource Strain:   . Difficulty of Paying Living Expenses: Not on file  Food Insecurity:   . Worried About Charity fundraiser in the Last Year: Not on file  .  Ran Out of Food in the Last Year: Not on file  Transportation Needs:   . Lack of Transportation (Medical): Not on file  . Lack of Transportation (Non-Medical): Not on file  Physical Activity:   . Days of Exercise per Week: Not on file  . Minutes of Exercise per Session: Not on file  Stress:   . Feeling of Stress : Not on file  Social Connections:   . Frequency of Communication with Friends and Family: Not on file  . Frequency of Social Gatherings with Friends and Family: Not on file  . Attends Religious Services: Not on file  . Active Member of Clubs or Organizations: Not on file  . Attends Archivist Meetings: Not on file  . Marital Status: Not on file    Intimate Partner Violence:   . Fear of Current or Ex-Partner: Not on file  . Emotionally Abused: Not on file  . Physically Abused: Not on file  . Sexually Abused: Not on file     Review of Systems: General: No chills, fever, night sweats or weight changes  Cardiovascular:  No chest pain, dyspnea on exertion, edema, orthopnea, palpitations, paroxysmal nocturnal dyspnea Dermatological: No rash, lesions or masses Respiratory: No cough, dyspnea Urologic: No hematuria, dysuria Abdominal: No nausea, vomiting, diarrhea, bright red blood per rectum, melena, or hematemesis Neurologic: No visual changes, weakness, changes in mental status All other systems reviewed and are otherwise negative except as noted above.  Physical Exam: Vitals:   04/24/20 1215  BP: 124/70  Pulse: (!) 105  SpO2: 97%  Weight: 205 lb (93 kg)  Height: _0  (1.854 m)    GEN- The patient is well appearing, alert and oriented x 3 today.   HEENT: normocephalic, atraumatic; sclera clear, conjunctiva pink; hearing intact; oropharynx clear; neck supple, no JVP Lymph- no cervical lymphadenopathy Lungs- Clear to ausculation bilaterally, normal work of breathing.  No wheezes, rales, rhonchi Heart- Regular rate and rhythm, no murmurs, rubs or gallops, PMI not laterally displaced GI- soft, non-tender, non-distended, bowel sounds present, no hepatosplenomegaly Extremities- no clubbing, cyanosis, or edema; DP/PT/radial pulses 2+ bilaterally MS- no significant deformity or atrophy Skin- warm and dry, no rash or lesion Psych- euthymic mood, full affect Neuro- strength and sensation are intact  EKG is ordered. Personal review of EKG from today shows sinus tachycardia at 107 bpm  Additional studies reviewed include: Previous EP notes. Admission notes.  Assessment and Plan:  1. Atrial flutter with RVR He is in NSR today.  Continue Eliquis with potential plans for ablation per Dr. Lovena Le note.  As he remains on IV ABX,  will have him follow up with Dr. Lovena Le in 6-8 weeks to discuss further. Sooner with breakthrough.   2. L3-LV discitis/osteomyelitis, sepsis due to Streptococcus infantarius bacteremia Remains on IV ABX via picc line for 3-4 more weeks per patient.   Shirley Friar, PA-C  04/24/20 2:17 PM

## 2020-05-08 ENCOUNTER — Ambulatory Visit: Payer: 59 | Admitting: Student

## 2020-05-11 ENCOUNTER — Telehealth: Payer: Self-pay | Admitting: Student

## 2020-05-11 MED ORDER — APIXABAN 5 MG PO TABS: 5.0000 mg | ORAL_TABLET | Freq: Two times a day (BID) | ORAL | 5 refills | Status: DC

## 2020-05-11 NOTE — Telephone Encounter (Signed)
°*  STAT* If patient is at the pharmacy, call can be transferred to refill team.   1. Which medications need to be refilled? (please list name of each medication and dose if known)apixaban (ELIQUIS) 5 MG TABS tablet [[id number]]    2. Which pharmacy/location (including street and city if local pharmacy) is medication to be sent to ?  [facility] [address] ([address]), [place] - [address]  [address] [place] ([place]) [place] [address]  Phone:  [phone] Fax:  [phone]   3. Do they need a 30 day or 90 day supply?  Pt is not sure , he rec'd this med in the hosp and they only gave him 60 tabs and would like to know if he is suppose to stay on this med ?    Best number [phone]

## 2020-05-11 NOTE — Telephone Encounter (Addendum)
Pt was started on Eliquis during recent hospitalization 04/08/20 for atrial flutter with plans for ablation. He needs to continue on Eliquis. Called pt who ran out of Eliquis 2 days ago over the weekend and it was the weekend so he was unable to get refill since discharge rx was only sent to [facility] pharmacy for 1 month with no follow up refills at any other pharmacy.  Pt aware that I have sent in refill on his Eliquis. Also activated copay card and sent info to pharmacy. Confirmed copay is now $0 at pharmacy. Submitted Safety Zone as well as patient is pending ablation which will now need to be pushed back an additional month due to missed anticoagulation doses over the weekend.

## 2020-05-12 ENCOUNTER — Other Ambulatory Visit: Payer: Self-pay

## 2020-05-12 ENCOUNTER — Ambulatory Visit (INDEPENDENT_AMBULATORY_CARE_PROVIDER_SITE_OTHER): Payer: 59 | Admitting: Family

## 2020-05-12 ENCOUNTER — Encounter: Payer: Self-pay | Admitting: Family

## 2020-05-12 VITALS — BP 135/81 | HR 110 | Temp 98.0°F | Ht 73.0 in | Wt 203.0 lb

## 2020-05-12 DIAGNOSIS — M4626 Osteomyelitis of vertebra, lumbar region: Secondary | ICD-10-CM | POA: Diagnosis not present

## 2020-05-12 MED ORDER — METOPROLOL SUCCINATE ER 50 MG PO TB24: 50.0000 mg | ORAL_TABLET | Freq: Every day | ORAL | 1 refills | Status: DC

## 2020-05-12 NOTE — Patient Instructions (Signed)
Nice to see you.  Continue to take the ceftriaxone daily.  We will check your inflammatory markers today to determine if we are safe to finish on 11/8 as planned versus extending 2 weeks.  Plan for follow-up in 1 month or sooner if needed.  Have a great day and stay safe!

## 2020-05-12 NOTE — Progress Notes (Signed)
 Subjective:    Patient ID: Caleb [name], male    DOB: 01/20/1961, 59 y.o.   MRN: [id number]  Chief Complaint  Patient presents with  . Follow-up     HPI:  Caleb L Herandez is a 59 y.o. male with previous medical history as listed below who was recently hospitalized with 3-week history of low back pain with fever and chills that continue to progress until his day of admission.  Found to have Covid pneumonia and MRI lumbar spine showed L3-L4 discitis/osteomyelitis with extension of inflammatory phlegmon into the prevertebral space involving prevertebral space of T3-T4 that measures 1.9 x 1.5 cm plus small bilateral psoas abscesses.  There was also anterior epidural extension of the phlegmon with 2 x 0.3 cm anterior epidural abscess at L4.  Blood cultures were positive for Streptococcus Infantarus.  TTE showing no overt vegetation.  Discharged with ceftriaxone for 6 weeks through PICC line.  Date set for 05/18/2020.  Initial CRP was 7.0 and ranged as high as 8.7.  Most recent blood work completed on 05/04/2020 with sed rate of 86 and CRP of 39.2.  Here today for hospitalization follow-up.  Caleb [name] has been receiving his ceftriaxone as prescribed daily with no adverse side effects or missed doses.  Back pain continues and is aggravated by certain movements although significantly improved since leaving the hospital.  Denies any fevers or chills.  Eager to return to work when he is able.  No Known Allergies    Outpatient Medications Prior to Visit  Medication Sig Dispense Refill  . apixaban (ELIQUIS) 5 MG TABS tablet Take 1 tablet (5 mg total) by mouth 2 (two) times daily. 60 tablet 5  . Blood Glucose Monitoring Suppl (TRUE METRIX AIR GLUCOSE METER) DEVI Check CBG BID 1 each 11  . cefTRIAXone (ROCEPHIN) IVPB Inject 2 g into the vein daily. Indication: Osteomyelitis  First Dose: No Last Day of Therapy:  05/18/2020 Labs - Once weekly:  CBC/D and BMP, Labs - Every other week:  ESR and  CRP Method of administration: IV Push Method of administration may be changed at the discretion of home infusion pharmacist based upon assessment of the patient and/or caregiver's ability to self-administer the medication ordered. 40 Units 0  . colchicine 0.6 MG tablet Take 0.6 mg by mouth 2 (two) times daily as needed (gout flares).     . cyclobenzaprine (FLEXERIL) 10 MG tablet Take 10 mg by mouth 3 (three) times daily as needed for muscle spasms.    . metFORMIN (GLUCOPHAGE) 500 MG tablet Take 1 tablet (500 mg total) by mouth 2 (two) times daily with a meal. 60 tablet 11  . metoprolol succinate (TOPROL-XL) 50 MG 24 hr tablet Take 1 tablet (50 mg total) by mouth daily. Take with or immediately following a meal. 90 tablet 1  . oxycodone (OXY-IR) 5 MG capsule Take 1 capsule (5 mg total) by mouth every 6 (six) hours as needed for pain. 60 capsule 0  . tamsulosin (FLOMAX) 0.4 MG CAPS capsule Take 1 capsule (0.4 mg total) by mouth daily after supper. 90 capsule 3  . tiZANidine (ZANAFLEX) 2 MG tablet Take 1-2 tablets (2-4 mg total) by mouth every 6 (six) hours as needed for muscle spasms. 60 tablet 1   No facility-administered medications prior to visit.     History reviewed. No pertinent past medical history.   Past Surgical History:  Procedure Laterality Date  . KNEE SURGERY    . ROTATOR CUFF REPAIR Right          Review of Systems  Constitutional: Negative for chills, diaphoresis, fatigue and fever.  Respiratory: Negative for cough, chest tightness, shortness of breath and wheezing.   Cardiovascular: Negative for chest pain.  Gastrointestinal: Negative for abdominal pain, diarrhea, nausea and vomiting.  Musculoskeletal: Positive for back pain.      Objective:    BP 135/81   Pulse (!) 110   Temp 98 F (36.7 C) (Oral)   Ht 6' 1" (1.854 m)   Wt 203 lb (92.1 kg)   SpO2 98%   BMI 26.78 kg/m  Nursing note and vital signs reviewed.  Physical Exam Constitutional:      General: He  is not in acute distress.    Appearance: He is well-developed.  Cardiovascular:     Rate and Rhythm: Normal rate and regular rhythm.     Heart sounds: Normal heart sounds.  Pulmonary:     Effort: Pulmonary effort is normal.     Breath sounds: Normal breath sounds.  Skin:    General: Skin is warm and dry.  Neurological:     Mental Status: He is alert and oriented to person, place, and time.  Psychiatric:        Behavior: Behavior normal.        Thought Content: Thought content normal.        Judgment: Judgment normal.      Depression screen PHQ 2/9 05/12/2020  Decreased Interest 0  Down, Depressed, Hopeless 0  PHQ - 2 Score 0       Assessment & Plan:    Patient Active Problem List   Diagnosis Date Noted  . COVID-19 virus infection 04/06/2020  . Typical atrial flutter (HCC)   . Sepsis (HCC) 04/04/2020  . Hyponatremia 04/04/2020  . Osteomyelitis of lumbar spine (HCC) 04/04/2020  . Diskitis 04/03/2020  . Diabetes mellitus type 2 with complications, uncontrolled (HCC) 03/11/2020  . Gout 03/11/2020     Problem List Items Addressed This Visit      Musculoskeletal and Integument   Osteomyelitis of lumbar spine (HCC) - Primary    Caleb [name] is a 59-year-old male with discitis/osteomyelitis of the lumbar spine with positive blood cultures for Streptococcus Infantaris.  Now approximately 5 weeks into treatment inflammatory markers remain significantly elevated with sedimentation rate of 86 and CRP of 39.2.  Discussed the likelihood that we will need to extend antibiotic therapy for an additional 2 weeks.  We will check inflammatory markers today.  We will plan for new echocardiogram to ensure no endocarditis.  Continue current dose of ceftriaxone through 11/8 pending blood work results.      Relevant Orders   C-reactive protein   Sedimentation rate       I am having Caleb [name] maintain his colchicine, True Metrix Air Glucose Meter, metFORMIN, cyclobenzaprine,  cefTRIAXone, tamsulosin, oxycodone, tiZANidine, apixaban, and metoprolol succinate.   Follow-up: Return in about 1 month (around 06/11/2020), or if symptoms worsen or fail to improve.   Caleb Calone, MSN, FNP-C Nurse Practitioner [facility] Main number: [phone]   

## 2020-05-12 NOTE — Assessment & Plan Note (Signed)
Caleb [name] is a 59 year old male with discitis/osteomyelitis of the lumbar spine with positive blood cultures for Streptococcus Infantaris.  Now approximately 5 weeks into treatment inflammatory markers remain significantly elevated with sedimentation rate of 86 and CRP of 39.2.  Discussed the likelihood that we will need to extend antibiotic therapy for an additional 2 weeks.  We will check inflammatory markers today.  We will plan for new echocardiogram to ensure no endocarditis.  Continue current dose of ceftriaxone through 11/8 pending blood work results.

## 2020-05-12 NOTE — Addendum Note (Signed)
Addended by: Kaarin Pardy E on: 05/12/2020 08:12 AM   Modules accepted: Orders

## 2020-05-13 ENCOUNTER — Telehealth: Payer: Self-pay | Admitting: Family

## 2020-05-13 LAB — SEDIMENTATION RATE: Sed Rate: 68 mm/h — ABNORMAL HIGH (ref 0–20)

## 2020-05-13 LAB — C-REACTIVE PROTEIN: CRP: 25.5 mg/L — ABNORMAL HIGH (ref <8.0)

## 2020-05-13 NOTE — Telephone Encounter (Signed)
Spoke with Mr. Dentremont regarding his blood work results that show improvements in his inflammatory markers although remain elevated. Will extend antibiotics through 11/22 with PICC removal at the end of treatment. Spoke with Amy at [facility] and updated regarding new end date.  Marcos Eke, NP 05/13/2020 4:07 PM

## 2020-05-14 ENCOUNTER — Telehealth: Payer: Self-pay | Admitting: *Deleted

## 2020-05-14 NOTE — Telephone Encounter (Signed)
Patient needs a note for work explaining new return to work date, since his antibiotics have been extended. He would like to also have time to recover before restarting work, perhaps 07/11/20 due to the lingering pain, or what ever Tammy Sours thinks.    He does not have any formal out-of-work documentation like short term disability.  He used his personal leave, then used donated leave to continue his time out of work.  HR has collected enough time to keep him out/paid until 11/8.  His HR department needs to know a return to work date so they can solicit for more donations of time.   Per Rudell Cobb, it will be ok to have an estimate of return to work written on [employer] (and available via mychart).  Call back number is [phone]. Andree Coss, RN

## 2020-05-15 ENCOUNTER — Encounter: Payer: Self-pay | Admitting: Family

## 2020-05-15 NOTE — Telephone Encounter (Signed)
Letter completed by Tammy Sours. RN called patient to let him know.  Tammy Sours has written patient out until 11/29. He completes IV antibiotics 11/22.  RN relayed message per Tammy Sours that patient should follow up with PCP as any continued pain may need referral to physical therapy or orthopedics for evaluation and follow up. Patient verbalized understanding. Andree Coss, RN

## 2020-05-15 NOTE — Telephone Encounter (Signed)
Patient called office today to follow up on letter for work. State he will need this letter by today for his employer.  Letter will need to extend him through 11/22 or whatever provider seems appropriate. Will forward message to provider. Patient would like call once letter is available on mychart.

## 2020-05-27 ENCOUNTER — Ambulatory Visit (INDEPENDENT_AMBULATORY_CARE_PROVIDER_SITE_OTHER): Payer: 59 | Admitting: Family Medicine

## 2020-05-27 ENCOUNTER — Encounter: Payer: Self-pay | Admitting: Family Medicine

## 2020-05-27 ENCOUNTER — Other Ambulatory Visit: Payer: Self-pay

## 2020-05-27 DIAGNOSIS — M4646 Discitis, unspecified, lumbar region: Secondary | ICD-10-CM

## 2020-05-27 NOTE — Progress Notes (Signed)
   Office Visit Note   Patient: Caleb [name]           Date of Birth: September 20, 1960           MRN: [id number] Visit Date: 05/27/2020 Requested by: Lavada Mesi, MD [address] [place],  [place] [address] PCP: Lavada Mesi, MD  Subjective: Chief Complaint  Patient presents with  . Lower Back - Follow-up    Feeling "much better." Patient would like to return to work. ID gave him clearance to return on 11/29, but pending clearance from here.    HPI: He is here for follow-up L3-4 discitis.  He has done very well since I last saw him.  Infectious disease specialist has cleared him to return to work pending my evaluation.  He states that he has pain primarily when transitioning from sitting to standing, or when bending.  He feels that he will be able to do all of his work related activities as long as he is careful.  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  From a diabetes and hypertension standpoint he is doing very well.  His numbers are virtually normal every time he checks them.  He has not had any gout attacks since last visit.                ROS:   All other systems were reviewed and are negative.  Objective: Vital Signs: There were no vitals taken for this visit.  Physical Exam:  General:  Alert and oriented, in no acute distress. Pulm:  Breathing unlabored. Psy:  Normal mood, congruent affect.  CV: Regular rate and rhythm without murmurs, rubs, or gallops.  No peripheral edema.  2+ radial and posterior tibial pulses. Lungs: Clear to auscultation throughout with no wheezing or areas of consolidation. Back: He is not tender to palpation of the lumbar spine today.   Imaging: No results found.  Assessment & Plan: 1.  L3-4 discitis, clinically resolving -Okay to return to work starting November 29.  Continue with physical therapy.  2.  Hypertension and diabetes, seem to be under good control. -Follow-up in 3 to 4 months for recheck and  labs.     Procedures: No procedures performed  No notes on file     PMFS History: Patient Active Problem List   Diagnosis Date Noted  . COVID-19 virus infection 04/06/2020  . Typical atrial flutter (HCC)   . Sepsis (HCC) 04/04/2020  . Hyponatremia 04/04/2020  . Osteomyelitis of lumbar spine (HCC) 04/04/2020  . Diskitis 04/03/2020  . Diabetes mellitus type 2 with complications, uncontrolled (HCC) 03/11/2020  . Gout 03/11/2020   No past medical history on file.  Family History  Problem Relation Age of Onset  . Cancer Father   . Lung cancer Father   . Healthy Sister   . Diabetes Neg Hx   . Heart attack Neg Hx   . Gout Neg Hx     Past Surgical History:  Procedure Laterality Date  . KNEE SURGERY    . ROTATOR CUFF REPAIR Right    Social History   Occupational History  . Not on file  Tobacco Use  . Smoking status: Never Smoker  . Smokeless tobacco: Never Used  Substance and Sexual Activity  . Alcohol use: Not Currently  . Drug use: Never  . Sexual activity: Not on file

## 2020-06-01 ENCOUNTER — Telehealth: Payer: Self-pay | Admitting: *Deleted

## 2020-06-01 NOTE — Telephone Encounter (Signed)
Per Marya Amsler, relayed verbal orders to [place] (home health nurse) that it is OK to pull picc after labs, including ESR, CRP are drawn. Landis Gandy, RN

## 2020-06-01 NOTE — Telephone Encounter (Signed)
Home health calling for orders to pull PICC today. His iv antibiotics end today, she has an appointment with him at 1:00. Please advise if you would like any labs, if ok to pull picc. Andree Coss, RN

## 2020-06-10 ENCOUNTER — Ambulatory Visit: Payer: 59 | Admitting: Family Medicine

## 2020-06-11 ENCOUNTER — Encounter: Payer: Self-pay | Admitting: Family

## 2020-06-11 ENCOUNTER — Ambulatory Visit (INDEPENDENT_AMBULATORY_CARE_PROVIDER_SITE_OTHER): Payer: 59 | Admitting: Family

## 2020-06-11 ENCOUNTER — Other Ambulatory Visit: Payer: Self-pay

## 2020-06-11 VITALS — BP 147/84 | HR 79 | Temp 98.0°F | Ht 73.0 in | Wt 211.0 lb

## 2020-06-11 DIAGNOSIS — M4626 Osteomyelitis of vertebra, lumbar region: Secondary | ICD-10-CM | POA: Diagnosis not present

## 2020-06-11 NOTE — Assessment & Plan Note (Signed)
Caleb [name] back pain is improved since previous office visit and now with new onset left pelvic/groin pain for about 1 week. Inflammatory markers are improved. Current description of pain not fully consistent with that of infection and has been off antibiotics for about 9 days. I think it does merit close monitoring given the infection that was present and recommend to continue with physical therapy and follow up with orthopedics. Recommend re-imaging of back or of hip if symptoms were to worsen. Plan for follow up in 1 month or sooner if needed.

## 2020-06-11 NOTE — Progress Notes (Signed)
Subjective:    Patient ID: Caleb [name], male    DOB: April 14, 1961, 59 y.o.   MRN: [id number]  Chief Complaint  Patient presents with  . Follow-up     HPI:  Caleb [name] is a 59 y.o. male with vertebral osteomyelitis and phlegmon who was last seen in the office on 11/2 for routine follow up with continued back pain and elevated inflammatory markers returns today for routine follow up. Antibiotic course was extended to 8 weeks with completion on 11/22.   Mr. Mirsky continues to have back pain with a severity level that is 50% of what he had initially. Has been working with orthopedics and physical therapy and has noted new onset hip and groin pain. Pain is described as severe and sharp. Timing is worse in the morning when first getting up that eases after movement and also with rest. No trauma or injury that he recalls. He took a steroid dose taper that helped with his symptoms for about 1 day. No fevers or chills.  PICC line was removed without complication on 11/22.    No Known Allergies    Outpatient Medications Prior to Visit  Medication Sig Dispense Refill  . apixaban (ELIQUIS) 5 MG TABS tablet Take 1 tablet (5 mg total) by mouth 2 (two) times daily. 60 tablet 5  . Blood Glucose Monitoring Suppl (TRUE METRIX AIR GLUCOSE METER) DEVI Check CBG BID 1 each 11  . celecoxib (CELEBREX) 200 MG capsule Take by mouth 2 (two) times daily.    . colchicine 0.6 MG tablet Take 0.6 mg by mouth 2 (two) times daily as needed (gout flares).     . cyclobenzaprine (FLEXERIL) 10 MG tablet Take 10 mg by mouth 3 (three) times daily as needed for muscle spasms.    . metFORMIN (GLUCOPHAGE) 500 MG tablet Take 1 tablet (500 mg total) by mouth 2 (two) times daily with a meal. 60 tablet 11  . metoprolol succinate (TOPROL-XL) 50 MG 24 hr tablet Take 1 tablet (50 mg total) by mouth daily. Take with or immediately following a meal. 90 tablet 1  . oxycodone (OXY-IR) 5 MG capsule Take 1 capsule (5 mg total) by  mouth every 6 (six) hours as needed for pain. 60 capsule 0  . tamsulosin (FLOMAX) 0.4 MG CAPS capsule Take 1 capsule (0.4 mg total) by mouth daily after supper. 90 capsule 3  . tiZANidine (ZANAFLEX) 2 MG tablet Take 1-2 tablets (2-4 mg total) by mouth every 6 (six) hours as needed for muscle spasms. 60 tablet 1   No facility-administered medications prior to visit.     History reviewed. No pertinent past medical history.   Past Surgical History:  Procedure Laterality Date  . KNEE SURGERY    . ROTATOR CUFF REPAIR Right        Review of Systems  Constitutional: Negative for chills, diaphoresis, fatigue and fever.  Respiratory: Negative for cough, chest tightness, shortness of breath and wheezing.   Cardiovascular: Negative for chest pain.  Gastrointestinal: Negative for abdominal pain, diarrhea, nausea and vomiting.  Musculoskeletal: Positive for back pain.       Positive for left hip/thigh pain      Objective:    BP (!) 147/84   Pulse 79   Temp 98 F (36.7 C)   Ht 6\' 1"  (1.854 m)   Wt 211 lb (95.7 kg)   BMI 27.84 kg/m  Nursing note and vital signs reviewed.  Physical Exam Constitutional:  General: He is not in acute distress.    Appearance: He is well-developed.  Eyes:     Pupils: Pupils are equal, round, and reactive to light.  Cardiovascular:     Rate and Rhythm: Normal rate and regular rhythm.     Heart sounds: Normal heart sounds.  Pulmonary:     Effort: Pulmonary effort is normal.     Breath sounds: Normal breath sounds.  Skin:    General: Skin is warm and dry.  Neurological:     Mental Status: He is alert.  Psychiatric:        Judgment: Judgment normal.      Depression screen [facility] 2/9 06/11/2020 05/12/2020  Decreased Interest 0 0  Down, Depressed, Hopeless 0 0  PHQ - 2 Score 0 0       Assessment & Plan:    Patient Active Problem List   Diagnosis Date Noted  . COVID-19 virus infection 04/06/2020  . Typical atrial flutter (HCC)   .  Sepsis (HCC) 04/04/2020  . Hyponatremia 04/04/2020  . Osteomyelitis of lumbar spine (HCC) 04/04/2020  . Diskitis 04/03/2020  . Diabetes mellitus type 2 with complications, uncontrolled (HCC) 03/11/2020  . Gout 03/11/2020     Problem List Items Addressed This Visit      Musculoskeletal and Integument   Osteomyelitis of lumbar spine [facility])    Mr. Eslinger back pain is improved since previous office visit and now with new onset left pelvic/groin pain for about 1 week. Inflammatory markers are improved. Current description of pain not fully consistent with that of infection and has been off antibiotics for about 9 days. I think it does merit close monitoring given the infection that was present and recommend to continue with physical therapy and follow up with orthopedics. Recommend re-imaging of back or of hip if symptoms were to worsen. Plan for follow up in 1 month or sooner if needed.           I am having Dreyson L. Trippett maintain his colchicine, True Metrix Air Glucose Meter, metFORMIN, cyclobenzaprine, tamsulosin, oxycodone, tiZANidine, apixaban, metoprolol succinate, and celecoxib.   Follow-up: Return in about 1 month (around 07/12/2020), or if symptoms worsen or fail to improve.   Marcos Eke, MSN, FNP-C Nurse Practitioner [facility] Main number: [phone]

## 2020-06-11 NOTE — Patient Instructions (Signed)
Nice to see you.  We will continue to monitor you off antibiotics.  Continue to work with orthopedics and physical therapy.  Plan for follow up in 1 month or sooner if needed through telehealth or cancel if improved.  Have a great day and stay safe!

## 2020-06-16 ENCOUNTER — Ambulatory Visit: Payer: 59 | Admitting: Internal Medicine

## 2020-06-16 ENCOUNTER — Other Ambulatory Visit: Payer: Self-pay

## 2020-06-16 ENCOUNTER — Encounter: Payer: Self-pay | Admitting: Internal Medicine

## 2020-06-16 VITALS — BP 148/70 | HR 81 | Ht 73.0 in | Wt 219.0 lb

## 2020-06-16 DIAGNOSIS — I483 Typical atrial flutter: Secondary | ICD-10-CM | POA: Diagnosis not present

## 2020-06-16 NOTE — Progress Notes (Signed)
HPI [name] returns today for evaluation of atrial flutter. He has a h/o osteomyelitis of the spine and rate controlled atrial flutter. He has been on long term IV anti-biotics. Since I saw him in the hospital, he notes that he has completed a course of anti-biotics. He has not had palpitations. He is still on Eliquis. He is pending an MRI as he has continued left groin pain, especially when he lies down. No Known Allergies   Current Outpatient Medications  Medication Sig Dispense Refill  . apixaban (ELIQUIS) 5 MG TABS tablet Take 1 tablet (5 mg total) by mouth 2 (two) times daily. 60 tablet 5  . Blood Glucose Monitoring Suppl (TRUE METRIX AIR GLUCOSE METER) DEVI Check CBG BID 1 each 11  . celecoxib (CELEBREX) 200 MG capsule Take by mouth 2 (two) times daily.    . colchicine 0.6 MG tablet Take 0.6 mg by mouth 2 (two) times daily as needed (gout flares).     . cyclobenzaprine (FLEXERIL) 10 MG tablet Take 10 mg by mouth 3 (three) times daily as needed for muscle spasms.    . metFORMIN (GLUCOPHAGE) 500 MG tablet Take 1 tablet (500 mg total) by mouth 2 (two) times daily with a meal. 60 tablet 11  . metoprolol succinate (TOPROL-XL) 50 MG 24 hr tablet Take 1 tablet (50 mg total) by mouth daily. Take with or immediately following a meal. 90 tablet 1  . oxycodone (OXY-IR) 5 MG capsule Take 1 capsule (5 mg total) by mouth every 6 (six) hours as needed for pain. 60 capsule 0  . tamsulosin (FLOMAX) 0.4 MG CAPS capsule Take 1 capsule (0.4 mg total) by mouth daily after supper. 90 capsule 3  . tiZANidine (ZANAFLEX) 2 MG tablet Take 1-2 tablets (2-4 mg total) by mouth every 6 (six) hours as needed for muscle spasms. 60 tablet 1   No current facility-administered medications for this visit.     No past medical history on file.  ROS:   All systems reviewed and negative except as noted in the HPI.   Past Surgical History:  Procedure Laterality Date  . KNEE SURGERY    . ROTATOR CUFF REPAIR  Right      Family History  Problem Relation Age of Onset  . Cancer Father   . Lung cancer Father   . Healthy Sister   . Diabetes Neg Hx   . Heart attack Neg Hx   . Gout Neg Hx      Social History   Socioeconomic History  . Marital status: Legally Separated    Spouse name: Not on file  . Number of children: Not on file  . Years of education: Not on file  . Highest education level: Not on file  Occupational History  . Not on file  Tobacco Use  . Smoking status: Never Smoker  . Smokeless tobacco: Never Used  Substance and Sexual Activity  . Alcohol use: Not Currently  . Drug use: Never  . Sexual activity: Not on file  Other Topics Concern  . Not on file  Social History Narrative  . Not on file   Social Determinants of Health   Financial Resource Strain:   . Difficulty of Paying Living Expenses: Not on file  Food Insecurity:   . Worried About Programme researcher, broadcasting/film/video in the Last Year: Not on file  . Ran Out of Food in the Last Year: Not on file  Transportation Needs:   . Lack  of Transportation (Medical): Not on file  . Lack of Transportation (Non-Medical): Not on file  Physical Activity:   . Days of Exercise per Week: Not on file  . Minutes of Exercise per Session: Not on file  Stress:   . Feeling of Stress : Not on file  Social Connections:   . Frequency of Communication with Friends and Family: Not on file  . Frequency of Social Gatherings with Friends and Family: Not on file  . Attends Religious Services: Not on file  . Active Member of Clubs or Organizations: Not on file  . Attends Banker Meetings: Not on file  . Marital Status: Not on file  Intimate Partner Violence:   . Fear of Current or Ex-Partner: Not on file  . Emotionally Abused: Not on file  . Physically Abused: Not on file  . Sexually Abused: Not on file     There were no vitals taken for this visit.  Physical Exam:  Well appearing NAD HEENT: Unremarkable Neck:  No JVD, no  thyromegally Lymphatics:  No adenopathy Back:  No CVA tenderness Lungs:  Clear HEART:  Regular rate rhythm, no murmurs, no rubs, no clicks Abd:  soft, positive bowel sounds, no organomegally, no rebound, no guarding Ext:  2 plus pulses, no edema, no cyanosis, no clubbing Skin:  No rashes no nodules Neuro:  CN II through XII intact, motor grossly intact  EKG  DEVICE  Normal device function.  See PaceArt for details.   Assess/Plan: 1. Atrial flutter - he has reverted back to NSR. I discussed the treatment options. His CHADSVASC is 2 with HTN and DM and I would continue Eliquis for now. 2. Osteomyelitis - his back pain is resolved as is his fever. He does still have left groin pain and is pending MRI. 3. coags - he will continue systemic anti-coagulation for now. No bleeding. 4. HTN - his sbp is up. He will continue his toprol.   Sharlot Gowda Shatika Grinnell,MD

## 2020-06-16 NOTE — Patient Instructions (Addendum)
Medication Instructions:  Your physician recommends that you continue on your current medications as directed. Please refer to the Current Medication list given to you today.  Labwork: None ordered.  Testing/Procedures: None ordered.  Follow-Up: Your physician wants you to follow-up in: April with Dr. Ladona Ridgel.   October 23, 2020 at 9:30 am at the [facility] office   Any Other Special Instructions Will Be Listed Below (If Applicable).  If you need a refill on your cardiac medications before your next appointment, please call your pharmacy.

## 2020-06-19 ENCOUNTER — Telehealth: Payer: Self-pay | Admitting: *Deleted

## 2020-06-19 NOTE — Telephone Encounter (Signed)
   [facility] Pre-operative Risk Assessment    HEARTCARE STAFF: - Please ensure there is not already an duplicate clearance open for this procedure. - Under Visit Info/Reason for Call, type in Other and utilize the format Clearance MM/DD/YY or Clearance TBD. Do not use dashes or single digits. - If request is for dental extraction, please clarify the # of teeth to be extracted.  Request for surgical clearance:  1. What type of surgery is being performed? LFT L3-4, IL ESI   2. When is this surgery scheduled? TBD   3. What type of clearance is required (medical clearance vs. Pharmacy clearance to hold med vs. Both)? BOTH  4. Are there any medications that need to be held prior to surgery and how long? ELIQUIS   5. Practice name and name of physician performing surgery? [facility]; DR. Shea Stakes [place]   6. What is the office phone number? [phone]   7.   What is the office fax number? [phone] ATTN: X-RAY  8.   Anesthesia type (None, local, MAC, general) ? CHOICE   Julaine Hua 06/19/2020, 3:33 PM  _________________________________________________________________   (provider comments below)

## 2020-06-19 NOTE — Telephone Encounter (Signed)
Patient with diagnosis of atrial fibrillation on Eliquis for anticoagulation.    Procedure: LFT L3-4, IL ESI Date of procedure: TBD    CHA2DS2-VASc Score = 1  This indicates a 0.6% annual risk of stroke. The patient's score is based upon: CHF History: No HTN History: No Diabetes History: Yes Stroke History: No Vascular Disease History: No Age Score: 0 Gender Score: 0      CrCl 145 Platelet count 215  Per office protocol, patient can hold Eliquis for 3 days prior to procedure.    Patient will not need bridging with Lovenox (enoxaparin) around procedure.

## 2020-06-19 NOTE — Telephone Encounter (Signed)
Pharmacy, can you please comment on how long patient can hold Eliquis?  Thank you!

## 2020-06-22 NOTE — Telephone Encounter (Signed)
   Primary Cardiologist: Chilton Si, MD  Chart reviewed as part of pre-operative protocol coverage. Patient was contacted 06/22/2020 in reference to pre-operative risk assessment for pending surgery as outlined below.  Caleb [name] was last seen on 06/16/20 by Dr. Ladona Ridgel.  Since that day, Caleb [name] has done well. Despite back pain, he can complete more than 4.0 METS. He does not have a history of MI or CVA.   Per our clinical pharmacist: Patient with diagnosis of atrial fibrillation on Eliquis for anticoagulation.    Procedure: LFT L3-4, IL ESI Date of procedure: TBD    CHA2DS2-VASc Score = 1  This indicates a 0.6% annual risk of stroke. The patient's score is based upon: CHF History: No HTN History: No Diabetes History: Yes Stroke History: No Vascular Disease History: No Age Score: 0 Gender Score: 0      CrCl 145 Platelet count 215  Per office protocol, patient can hold Eliquis for 3 days prior to procedure.    Patient will not need bridging with Lovenox (enoxaparin) around procedure.   Therefore, based on ACC/AHA guidelines, the patient would be at acceptable risk for the planned procedure without further cardiovascular testing.   The patient was advised that if he develops new symptoms prior to surgery to contact our office to arrange for a follow-up visit, and he verbalized understanding.  I will route this recommendation to the requesting party via Epic fax function and remove from pre-op pool. Please call with questions.  Caleb [name] Caleb Emily, PA 06/22/2020, 4:53 PM

## 2020-07-15 ENCOUNTER — Telehealth: Payer: 59 | Admitting: Family

## 2020-07-15 ENCOUNTER — Other Ambulatory Visit: Payer: Self-pay

## 2020-08-28 ENCOUNTER — Other Ambulatory Visit: Payer: Self-pay

## 2020-08-28 ENCOUNTER — Ambulatory Visit (INDEPENDENT_AMBULATORY_CARE_PROVIDER_SITE_OTHER): Payer: 59 | Admitting: Family Medicine

## 2020-08-28 ENCOUNTER — Encounter: Payer: Self-pay | Admitting: Family Medicine

## 2020-08-28 VITALS — BP 125/71 | HR 81 | Ht 73.0 in | Wt 238.8 lb

## 2020-08-28 DIAGNOSIS — M48061 Spinal stenosis, lumbar region without neurogenic claudication: Secondary | ICD-10-CM | POA: Diagnosis not present

## 2020-08-28 DIAGNOSIS — N529 Male erectile dysfunction, unspecified: Secondary | ICD-10-CM

## 2020-08-28 DIAGNOSIS — E119 Type 2 diabetes mellitus without complications: Secondary | ICD-10-CM | POA: Diagnosis not present

## 2020-08-28 DIAGNOSIS — I10 Essential (primary) hypertension: Secondary | ICD-10-CM | POA: Diagnosis not present

## 2020-08-28 MED ORDER — SILDENAFIL CITRATE 100 MG PO TABS: 100.0000 mg | ORAL_TABLET | Freq: Every day | ORAL | 11 refills | Status: DC | PRN

## 2020-08-28 NOTE — Progress Notes (Signed)
Office Visit Note   Patient: Caleb [name]           Date of Birth: 1960/12/11           MRN: [id number] Visit Date: 08/28/2020 Requested by: Lavada Mesi, MD [address] [place],  [place] [address] PCP: Lavada Mesi, MD  Subjective: Chief Complaint  Patient presents with  . Diabetes Mellitus    Follow up  . Blood Pressure Check    Follow up hypertension  . Other    Still having pains in the back of his legs, with numbness in the feet.    HPI: He is here for follow-up diabetes and hypertension.  Doing well, blood pressure has been well controlled.  Blood sugars are around 80-90 in the mornings.  No symptoms related to blood pressure or diabetes.  He has been struggling with low back pain with bilateral leg pain and numbness in his feet.  He has a history of severe spinal stenosis based on MRI scan.  He has been to Dr. Lucie Leather recently for epidural injections twice.  He has only gotten minimal relief from them.  He cannot stand very long before he has to sit down due to pain.  The pain gets better when sitting, but the numbness in his feet does not.  He's also having troubles with erectile dysfunction in the past several months.  In addition, having mild depression symptoms.                ROS:   All other systems were reviewed and are negative.  Objective: Vital Signs: BP 125/71   Pulse 81   Ht 6\' 1"  (1.854 m)   Wt 238 lb 12.8 oz (108.3 kg)   BMI 31.51 kg/m   Physical Exam:  General:  Alert and oriented, in no acute distress. Pulm:  Breathing unlabored. Psy:  Normal mood, congruent affect.  CV: Regular rate and rhythm without murmurs, rubs, or gallops.  No peripheral edema.  2+ radial and posterior tibial pulses. Lungs: Clear to auscultation throughout with no wheezing or areas of consolidation. Legs: He has good strength with ankle dorsiflexion, eversion and plantar flexion bilaterally with 1+ knee and ankle DTRs.   Imaging: No results found.  Assessment  & Plan: 1.  Hypertension, under good control. -Return in 3 months for recheck.  2.  Diabetes, probably under good control -A1c today.  3.  Lumbar spinal stenosis - Will discuss the possibility of starting low-dose Cymbalta for pain relief and for depressed mood.  I will ask Dr. for his thoughts on this.  4.  ED - Try viagra. -Could be related to metoprolol, diabetes, or possibly lumbar stenosis.     Procedures: No procedures performed        PMFS History: Patient Active Problem List   Diagnosis Date Noted  . Essential hypertension 08/28/2020  . COVID-19 virus infection 04/06/2020  . Typical atrial flutter (HCC)   . Sepsis (HCC) 04/04/2020  . Hyponatremia 04/04/2020  . Osteomyelitis of lumbar spine (HCC) 04/04/2020  . Diskitis 04/03/2020  . Diabetes mellitus type 2 with complications, uncontrolled (HCC) 03/11/2020  . Gout 03/11/2020   History reviewed. No pertinent past medical history.  Family History  Problem Relation Age of Onset  . Cancer Father   . Lung cancer Father   . Healthy Sister   . Diabetes Neg Hx   . Heart attack Neg Hx   . Gout Neg Hx     Past Surgical History:  Procedure Laterality Date  . KNEE SURGERY    . ROTATOR CUFF REPAIR Right    Social History   Occupational History  . Not on file  Tobacco Use  . Smoking status: Never Smoker  . Smokeless tobacco: Never Used  Substance and Sexual Activity  . Alcohol use: Not Currently  . Drug use: Never  . Sexual activity: Not on file

## 2020-08-29 LAB — CBC WITH DIFFERENTIAL/PLATELET
Absolute Monocytes: 498 cells/uL (ref 200–950)
Basophils Absolute: 42 cells/uL (ref 0–200)
Basophils Relative: 0.8 %
Eosinophils Absolute: 58 cells/uL (ref 15–500)
Eosinophils Relative: 1.1 %
HCT: 51.1 % — ABNORMAL HIGH (ref 38.5–50.0)
Hemoglobin: 17.3 g/dL — ABNORMAL HIGH (ref 13.2–17.1)
Lymphs Abs: 1134 cells/uL (ref 850–3900)
MCH: 32.2 pg (ref 27.0–33.0)
MCHC: 33.9 g/dL (ref 32.0–36.0)
MCV: 95 fL (ref 80.0–100.0)
MPV: 9.8 fL (ref 7.5–12.5)
Monocytes Relative: 9.4 %
Neutro Abs: 3567 cells/uL (ref 1500–7800)
Neutrophils Relative %: 67.3 %
Platelets: 203 10*3/uL (ref 140–400)
RBC: 5.38 10*6/uL (ref 4.20–5.80)
RDW: 13.9 % (ref 11.0–15.0)
Total Lymphocyte: 21.4 %
WBC: 5.3 10*3/uL (ref 3.8–10.8)

## 2020-08-29 LAB — HEMOGLOBIN A1C
Hgb A1c MFr Bld: 5.8 % of total Hgb — ABNORMAL HIGH (ref <5.7)
Mean Plasma Glucose: 120 mg/dL
eAG (mmol/L): 6.6 mmol/L

## 2020-08-29 LAB — COMPREHENSIVE METABOLIC PANEL
AG Ratio: 1.4 (calc) (ref 1.0–2.5)
ALT: 15 U/L (ref 9–46)
AST: 20 U/L (ref 10–35)
Albumin: 4 g/dL (ref 3.6–5.1)
Alkaline phosphatase (APISO): 84 U/L (ref 35–144)
BUN: 11 mg/dL (ref 7–25)
CO2: 28 mmol/L (ref 20–32)
Calcium: 9.5 mg/dL (ref 8.6–10.3)
Chloride: 103 mmol/L (ref 98–110)
Creat: 0.87 mg/dL (ref 0.70–1.25)
Globulin: 2.8 g/dL (calc) (ref 1.9–3.7)
Glucose, Bld: 81 mg/dL (ref 65–99)
Potassium: 4.6 mmol/L (ref 3.5–5.3)
Sodium: 142 mmol/L (ref 135–146)
Total Bilirubin: 0.5 mg/dL (ref 0.2–1.2)
Total Protein: 6.8 g/dL (ref 6.1–8.1)

## 2020-08-31 ENCOUNTER — Telehealth: Payer: Self-pay | Admitting: Family Medicine

## 2020-08-31 NOTE — Telephone Encounter (Signed)
Labs are notable for the following:  Hemoglobin A1c looks great at 5.8.  CBC shows a borderline elevated hemoglobin and hematocrit.  We should monitor in about 3 to 4 months.  Metabolic panel looks perfect.

## 2020-08-31 NOTE — Telephone Encounter (Signed)
Dr. Lorenda Hatchet office called stating Dr.Hilts wanted to talk to him concerning the pt.   Dr.Wilabe # [phone]

## 2020-09-01 NOTE — Telephone Encounter (Signed)
I called and left message to call me back

## 2020-09-15 ENCOUNTER — Telehealth: Payer: Self-pay

## 2020-09-15 NOTE — Telephone Encounter (Signed)
   [facility] Pre-operative Risk Assessment    HEARTCARE STAFF: - Please ensure there is not already an duplicate clearance open for this procedure. - Under Visit Info/Reason for Call, type in Other and utilize the format Clearance MM/DD/YY or Clearance TBD. Do not use dashes or single digits. - If request is for dental extraction, please clarify the # of teeth to be extracted.  Request for surgical clearance:  1. What type of surgery is being performed? L3-4 Minimally invasive Facetectomy/ posterolateral instrumental fusion   2. When is this surgery scheduled? TBD   3. What type of clearance is required (medical clearance vs. Pharmacy clearance to hold med vs. Both)? BOTH  4. Are there any medications that need to be held prior to surgery and how long?Yes   5. Practice name and name of physician performing surgery? [facility]   6. What is the office phone number? [phone]   7.   What is the office fax number? [phone]  8.   Anesthesia type (None, local, MAC, general) ? General   Narek Kniss N Aceyn Kathol 09/15/2020, 3:11 PM  _________________________________________________________________   (provider comments below)

## 2020-09-16 NOTE — Telephone Encounter (Signed)
   Primary Cardiologist: Chilton Si, MD  Chart reviewed as part of pre-operative protocol coverage. Patient was contacted 09/16/2020 in reference to pre-operative risk assessment for pending surgery as outlined below.  Caleb [name] was last seen on 06/16/20 by Dr. Ladona Ridgel.  Since that day, Caleb [name] has done well.  He can complete more than 4.0 METS without angina.   Per our clinical pharmacist: Patient with diagnosis of afib on Eliquis for anticoagulation.    Procedure: L3-4 Minimally invasive Facetectomy/ posterolateral instrumental fusion  Date of procedure: TBD  CHA2DS2-VASc Score = 2  This indicates a 2.2% annual risk of stroke. The patient's score is based upon: CHF History: No HTN History: Yes Diabetes History: Yes Stroke History: No Vascular Disease History: No Age Score: 0 Gender Score: 0   CrCl >125mL/min using adjusted body weight Platelet count 203K  Per office protocol, patient can hold Eliquis for 3 days prior to procedure.  Therefore, based on ACC/AHA guidelines, the patient would be at acceptable risk for the planned procedure without further cardiovascular testing.   The patient was advised that if he develops new symptoms prior to surgery to contact our office to arrange for a follow-up visit, and he verbalized understanding.  I will route this recommendation to the requesting party via Epic fax function and remove from pre-op pool. Please call with questions.  Marcelino Duster, PA 09/16/2020, 3:49 PM

## 2020-09-16 NOTE — Telephone Encounter (Signed)
Patient with diagnosis of afib on Eliquis for anticoagulation.    Procedure: L3-4 Minimally invasive Facetectomy/ posterolateral instrumental fusion   Date of procedure: TBD  CHA2DS2-VASc Score = 2  This indicates a 2.2% annual risk of stroke. The patient's score is based upon: CHF History: No HTN History: Yes Diabetes History: Yes Stroke History: No Vascular Disease History: No Age Score: 0 Gender Score: 0   CrCl >138mL/min using adjusted body weight Platelet count 203K  Per office protocol, patient can hold Eliquis for 3 days prior to procedure.

## 2020-09-18 ENCOUNTER — Other Ambulatory Visit: Payer: Self-pay | Admitting: Neurological Surgery

## 2020-09-28 ENCOUNTER — Other Ambulatory Visit (HOSPITAL_COMMUNITY)
Admission: RE | Admit: 2020-09-28 | Discharge: 2020-09-28 | Disposition: A | Discharge status: Home / Self Care | Payer: 59 | Source: Ambulatory Visit | Attending: Neurological Surgery | Admitting: Neurological Surgery

## 2020-09-28 DIAGNOSIS — Z01812 Encounter for preprocedural laboratory examination: Secondary | ICD-10-CM | POA: Insufficient documentation

## 2020-09-28 DIAGNOSIS — Z20822 Contact with and (suspected) exposure to covid-19: Secondary | ICD-10-CM | POA: Insufficient documentation

## 2020-09-29 ENCOUNTER — Encounter (HOSPITAL_COMMUNITY): Payer: Self-pay | Admitting: Neurological Surgery

## 2020-09-29 ENCOUNTER — Other Ambulatory Visit: Payer: Self-pay

## 2020-09-29 LAB — SARS CORONAVIRUS 2 (TAT 6-24 HRS): SARS Coronavirus 2: NEGATIVE

## 2020-09-29 NOTE — Progress Notes (Signed)
Mr Caleb [name] denies chest pain or shortness of breath. Patient was tested for Covid and has been in quarantine since that time.  [name] has type II diabetes. Patient reports that CBGs run 80-90.  I instructed patient to not take Metformin in am.I instructed patient to check CBG after awaking and every 2 hours until arrival  to the hospital.  I Instructed patient if CBG is less than 70 to take 4 Glucose Tablets or 1 tube of Glucose Gel or 1/2 cup of a clear juice. Recheck CBG in 15 minutes if CBG is not over 70 call, pre- op desk at [phone] for further instructions. If scheduled to receive Insulin, do not take Insulin

## 2020-09-30 ENCOUNTER — Inpatient Hospital Stay (HOSPITAL_COMMUNITY): Payer: 59 | Admitting: Certified Registered Nurse Anesthetist

## 2020-09-30 ENCOUNTER — Other Ambulatory Visit: Payer: Self-pay

## 2020-09-30 ENCOUNTER — Encounter (HOSPITAL_COMMUNITY): Payer: Self-pay | Admitting: Neurological Surgery

## 2020-09-30 ENCOUNTER — Inpatient Hospital Stay (HOSPITAL_COMMUNITY): Payer: 59

## 2020-09-30 ENCOUNTER — Inpatient Hospital Stay (HOSPITAL_COMMUNITY)
Admission: RE | Admit: 2020-09-30 | Discharge: 2020-10-01 | DRG: 460 | Disposition: A | Discharge status: Home / Self Care | Payer: 59 | Attending: Neurological Surgery | Admitting: Neurological Surgery

## 2020-09-30 ENCOUNTER — Encounter (HOSPITAL_COMMUNITY)
Admission: RE | Disposition: A | Discharge status: Home / Self Care | Payer: Self-pay | Source: Home / Self Care | Attending: Neurological Surgery

## 2020-09-30 DIAGNOSIS — Z87891 Personal history of nicotine dependence: Secondary | ICD-10-CM

## 2020-09-30 DIAGNOSIS — Z7984 Long term (current) use of oral hypoglycemic drugs: Secondary | ICD-10-CM | POA: Diagnosis not present

## 2020-09-30 DIAGNOSIS — E119 Type 2 diabetes mellitus without complications: Secondary | ICD-10-CM | POA: Diagnosis present

## 2020-09-30 DIAGNOSIS — Z20822 Contact with and (suspected) exposure to covid-19: Secondary | ICD-10-CM | POA: Diagnosis present

## 2020-09-30 DIAGNOSIS — Z79899 Other long term (current) drug therapy: Secondary | ICD-10-CM | POA: Diagnosis not present

## 2020-09-30 DIAGNOSIS — I10 Essential (primary) hypertension: Secondary | ICD-10-CM | POA: Diagnosis present

## 2020-09-30 DIAGNOSIS — Z419 Encounter for procedure for purposes other than remedying health state, unspecified: Secondary | ICD-10-CM

## 2020-09-30 DIAGNOSIS — M5416 Radiculopathy, lumbar region: Secondary | ICD-10-CM | POA: Diagnosis present

## 2020-09-30 HISTORY — DX: Essential (primary) hypertension: I10

## 2020-09-30 HISTORY — DX: Type 2 diabetes mellitus without complications: E11.9

## 2020-09-30 HISTORY — DX: Cardiac arrhythmia, unspecified: I49.9

## 2020-09-30 LAB — GLUCOSE, CAPILLARY
Glucose-Capillary: 125 mg/dL — ABNORMAL HIGH (ref 70–99)
Glucose-Capillary: 118 mg/dL — ABNORMAL HIGH (ref 70–99)
Glucose-Capillary: 219 mg/dL — ABNORMAL HIGH (ref 70–99)

## 2020-09-30 LAB — BASIC METABOLIC PANEL
Anion gap: 8 (ref 5–15)
BUN: 12 mg/dL (ref 6–20)
CO2: 25 mmol/L (ref 22–32)
Calcium: 9.2 mg/dL (ref 8.9–10.3)
Chloride: 106 mmol/L (ref 98–111)
Creatinine, Ser: 1.04 mg/dL (ref 0.61–1.24)
GFR, Estimated: >60 mL/min (ref >60)
Glucose, Bld: 121 mg/dL — ABNORMAL HIGH (ref 70–99)
Potassium: 4.7 mmol/L (ref 3.5–5.1)
Sodium: 139 mmol/L (ref 135–145)

## 2020-09-30 LAB — TYPE AND SCREEN
ABO/RH(D): O POS
Antibody Screen: NEGATIVE

## 2020-09-30 LAB — CBC
HCT: 48.8 % (ref 39.0–52.0)
Hemoglobin: 17.2 g/dL — ABNORMAL HIGH (ref 13.0–17.0)
MCH: 33 pg (ref 26.0–34.0)
MCHC: 35.2 g/dL (ref 30.0–36.0)
MCV: 93.7 fL (ref 80.0–100.0)
Platelets: 121 10*3/uL — ABNORMAL LOW (ref 150–400)
RBC: 5.21 MIL/uL (ref 4.22–5.81)
RDW: 13.3 % (ref 11.5–15.5)
WBC: 3.9 10*3/uL — ABNORMAL LOW (ref 4.0–10.5)
nRBC: 0 % (ref 0.0–0.2)

## 2020-09-30 LAB — SURGICAL PCR SCREEN
MRSA, PCR: NEGATIVE
Staphylococcus aureus: NEGATIVE

## 2020-09-30 LAB — ABO/RH: ABO/RH(D): O POS

## 2020-09-30 SURGERY — POSTERIOR LUMBAR FUSION 1 LEVEL
Anesthesia: General | Site: Back | Laterality: Left

## 2020-09-30 MED ORDER — CELECOXIB 200 MG PO CAPS
200.0000 mg | ORAL_CAPSULE | Freq: Two times a day (BID) | ORAL | Status: DC
  Administered 2020-09-30 – 2020-10-01 (×2): 200 mg via ORAL
  Filled 2020-09-30 (×2): qty 1

## 2020-09-30 MED ORDER — ONDANSETRON HCL 4 MG/2ML IJ SOLN
INTRAMUSCULAR | Status: AC
  Filled 2020-09-30: qty 2

## 2020-09-30 MED ORDER — PROPOFOL 10 MG/ML IV BOLUS
INTRAVENOUS | Status: AC
  Filled 2020-09-30: qty 20

## 2020-09-30 MED ORDER — SODIUM CHLORIDE 0.9 % IV SOLN: 250.0000 mL | INTRAVENOUS | Status: DC

## 2020-09-30 MED ORDER — CEFAZOLIN SODIUM-DEXTROSE 2-4 GM/100ML-% IV SOLN
2.0000 g | INTRAVENOUS | Status: AC
  Administered 2020-09-30: 2 g via INTRAVENOUS
  Filled 2020-09-30: qty 100

## 2020-09-30 MED ORDER — DEXAMETHASONE SODIUM PHOSPHATE 10 MG/ML IJ SOLN
INTRAMUSCULAR | Status: DC | PRN
  Administered 2020-09-30: 5 mg via INTRAVENOUS

## 2020-09-30 MED ORDER — POLYETHYLENE GLYCOL 3350 17 G PO PACK: 17.0000 g | PACK | Freq: Every day | ORAL | Status: DC | PRN

## 2020-09-30 MED ORDER — INSULIN ASPART 100 UNIT/ML ~~LOC~~ SOLN
0.0000 [IU] | Freq: Every day | SUBCUTANEOUS | Status: DC
  Administered 2020-09-30: 2 [IU] via SUBCUTANEOUS

## 2020-09-30 MED ORDER — ACETAMINOPHEN 500 MG PO TABS
1000.0000 mg | ORAL_TABLET | Freq: Once | ORAL | Status: AC
  Administered 2020-09-30: 1000 mg via ORAL
  Filled 2020-09-30: qty 2

## 2020-09-30 MED ORDER — SODIUM CHLORIDE 0.9% FLUSH: 3.0000 mL | Freq: Two times a day (BID) | INTRAVENOUS | Status: DC

## 2020-09-30 MED ORDER — FENTANYL CITRATE (PF) 100 MCG/2ML IJ SOLN
INTRAMUSCULAR | Status: DC | PRN
  Administered 2020-09-30 (×2): 50 ug via INTRAVENOUS
  Administered 2020-09-30: 100 ug via INTRAVENOUS
  Administered 2020-09-30 (×2): 50 ug via INTRAVENOUS

## 2020-09-30 MED ORDER — ONDANSETRON HCL 4 MG PO TABS: 4.0000 mg | ORAL_TABLET | Freq: Four times a day (QID) | ORAL | Status: DC | PRN

## 2020-09-30 MED ORDER — CHLORHEXIDINE GLUCONATE 0.12 % MT SOLN
15.0000 mL | Freq: Once | OROMUCOSAL | Status: AC
  Administered 2020-09-30: 15 mL via OROMUCOSAL
  Filled 2020-09-30: qty 15

## 2020-09-30 MED ORDER — CEFAZOLIN SODIUM-DEXTROSE 2-4 GM/100ML-% IV SOLN
2.0000 g | Freq: Three times a day (TID) | INTRAVENOUS | Status: AC
  Administered 2020-09-30 – 2020-10-01 (×2): 2 g via INTRAVENOUS
  Filled 2020-09-30 (×2): qty 100

## 2020-09-30 MED ORDER — OXYCODONE HCL 5 MG PO TABS
ORAL_TABLET | ORAL | Status: AC
  Filled 2020-09-30: qty 1

## 2020-09-30 MED ORDER — HYDROMORPHONE HCL 1 MG/ML IJ SOLN
INTRAMUSCULAR | Status: AC
  Filled 2020-09-30: qty 1

## 2020-09-30 MED ORDER — MENTHOL 3 MG MT LOZG: 1.0000 | LOZENGE | OROMUCOSAL | Status: DC | PRN

## 2020-09-30 MED ORDER — SODIUM CHLORIDE 0.9% FLUSH: 3.0000 mL | INTRAVENOUS | Status: DC | PRN

## 2020-09-30 MED ORDER — LIDOCAINE-EPINEPHRINE 1 %-1:100000 IJ SOLN
INTRAMUSCULAR | Status: DC | PRN
  Administered 2020-09-30: 10 mL

## 2020-09-30 MED ORDER — LIDOCAINE 2% (20 MG/ML) 5 ML SYRINGE
INTRAMUSCULAR | Status: AC
  Filled 2020-09-30: qty 5

## 2020-09-30 MED ORDER — PHENYLEPHRINE 40 MCG/ML (10ML) SYRINGE FOR IV PUSH (FOR BLOOD PRESSURE SUPPORT)
PREFILLED_SYRINGE | INTRAVENOUS | Status: AC
  Filled 2020-09-30: qty 10

## 2020-09-30 MED ORDER — TIZANIDINE HCL 4 MG PO TABS
4.0000 mg | ORAL_TABLET | Freq: Four times a day (QID) | ORAL | Status: DC | PRN
  Administered 2020-09-30 – 2020-10-01 (×2): 4 mg via ORAL
  Filled 2020-09-30 (×2): qty 1

## 2020-09-30 MED ORDER — OXYCODONE HCL 5 MG PO TABS
5.0000 mg | ORAL_TABLET | ORAL | Status: DC | PRN
  Administered 2020-09-30: 5 mg via ORAL

## 2020-09-30 MED ORDER — LACTATED RINGERS IV SOLN: INTRAVENOUS | Status: DC

## 2020-09-30 MED ORDER — METFORMIN HCL 500 MG PO TABS
500.0000 mg | ORAL_TABLET | Freq: Every day | ORAL | Status: DC
  Administered 2020-10-01: 500 mg via ORAL
  Filled 2020-09-30: qty 1

## 2020-09-30 MED ORDER — SUGAMMADEX SODIUM 200 MG/2ML IV SOLN
INTRAVENOUS | Status: DC | PRN
  Administered 2020-09-30: 200 mg via INTRAVENOUS

## 2020-09-30 MED ORDER — MIDAZOLAM HCL 5 MG/5ML IJ SOLN
INTRAMUSCULAR | Status: DC | PRN
  Administered 2020-09-30: 2 mg via INTRAVENOUS

## 2020-09-30 MED ORDER — INSULIN ASPART 100 UNIT/ML ~~LOC~~ SOLN
0.0000 [IU] | Freq: Three times a day (TID) | SUBCUTANEOUS | Status: DC
  Administered 2020-10-01: 3 [IU] via SUBCUTANEOUS

## 2020-09-30 MED ORDER — ROCURONIUM BROMIDE 10 MG/ML (PF) SYRINGE
PREFILLED_SYRINGE | INTRAVENOUS | Status: DC | PRN
  Administered 2020-09-30: 15 mg via INTRAVENOUS
  Administered 2020-09-30 (×2): 30 mg via INTRAVENOUS
  Administered 2020-09-30: 50 mg via INTRAVENOUS
  Administered 2020-09-30 (×2): 20 mg via INTRAVENOUS

## 2020-09-30 MED ORDER — ACETAMINOPHEN 650 MG RE SUPP: 650.0000 mg | RECTAL | Status: DC | PRN

## 2020-09-30 MED ORDER — TAMSULOSIN HCL 0.4 MG PO CAPS
0.4000 mg | ORAL_CAPSULE | Freq: Every day | ORAL | Status: DC
  Administered 2020-09-30: 0.4 mg via ORAL
  Filled 2020-09-30: qty 1

## 2020-09-30 MED ORDER — CHLORHEXIDINE GLUCONATE CLOTH 2 % EX PADS: 6.0000 | MEDICATED_PAD | Freq: Once | CUTANEOUS | Status: DC

## 2020-09-30 MED ORDER — THROMBIN 5000 UNITS EX SOLR
CUTANEOUS | Status: AC
  Filled 2020-09-30: qty 5000

## 2020-09-30 MED ORDER — 0.9 % SODIUM CHLORIDE (POUR BTL) OPTIME
TOPICAL | Status: DC | PRN
  Administered 2020-09-30: 1000 mL

## 2020-09-30 MED ORDER — ROCURONIUM BROMIDE 10 MG/ML (PF) SYRINGE
PREFILLED_SYRINGE | INTRAVENOUS | Status: AC
  Filled 2020-09-30: qty 10

## 2020-09-30 MED ORDER — OXYCODONE HCL 5 MG PO TABS
10.0000 mg | ORAL_TABLET | ORAL | Status: DC | PRN
  Administered 2020-09-30 – 2020-10-01 (×4): 10 mg via ORAL
  Filled 2020-09-30 (×5): qty 2

## 2020-09-30 MED ORDER — THROMBIN 5000 UNITS EX SOLR: OROMUCOSAL | Status: DC | PRN

## 2020-09-30 MED ORDER — BUPIVACAINE HCL (PF) 0.5 % IJ SOLN
INTRAMUSCULAR | Status: AC
  Filled 2020-09-30: qty 30

## 2020-09-30 MED ORDER — ACETAMINOPHEN 325 MG PO TABS
650.0000 mg | ORAL_TABLET | ORAL | Status: DC | PRN
  Administered 2020-09-30: 650 mg via ORAL
  Filled 2020-09-30 (×2): qty 2

## 2020-09-30 MED ORDER — PROPOFOL 10 MG/ML IV BOLUS
INTRAVENOUS | Status: DC | PRN
  Administered 2020-09-30: 150 mg via INTRAVENOUS

## 2020-09-30 MED ORDER — ONDANSETRON HCL 4 MG/2ML IJ SOLN
4.0000 mg | Freq: Four times a day (QID) | INTRAMUSCULAR | Status: DC | PRN
  Administered 2020-09-30: 4 mg via INTRAVENOUS
  Filled 2020-09-30: qty 2

## 2020-09-30 MED ORDER — ORAL CARE MOUTH RINSE: 15.0000 mL | Freq: Once | OROMUCOSAL | Status: AC

## 2020-09-30 MED ORDER — DOCUSATE SODIUM 100 MG PO CAPS: 100.0000 mg | ORAL_CAPSULE | Freq: Two times a day (BID) | ORAL | Status: DC

## 2020-09-30 MED ORDER — HYDROMORPHONE HCL 1 MG/ML IJ SOLN
0.5000 mg | Freq: Once | INTRAMUSCULAR | Status: AC
  Administered 2020-09-30: 0.5 mg via INTRAVENOUS

## 2020-09-30 MED ORDER — DEXAMETHASONE SODIUM PHOSPHATE 10 MG/ML IJ SOLN
INTRAMUSCULAR | Status: AC
  Filled 2020-09-30: qty 1

## 2020-09-30 MED ORDER — METOPROLOL SUCCINATE ER 50 MG PO TB24
50.0000 mg | ORAL_TABLET | Freq: Every day | ORAL | Status: DC
  Administered 2020-10-01: 50 mg via ORAL
  Filled 2020-09-30 (×2): qty 1

## 2020-09-30 MED ORDER — MIDAZOLAM HCL 2 MG/2ML IJ SOLN
INTRAMUSCULAR | Status: AC
  Filled 2020-09-30: qty 2

## 2020-09-30 MED ORDER — GABAPENTIN 300 MG PO CAPS
300.0000 mg | ORAL_CAPSULE | Freq: Four times a day (QID) | ORAL | Status: DC
  Administered 2020-09-30 – 2020-10-01 (×2): 300 mg via ORAL
  Filled 2020-09-30 (×2): qty 1

## 2020-09-30 MED ORDER — HYDROMORPHONE HCL 1 MG/ML IJ SOLN: 1.0000 mg | INTRAMUSCULAR | Status: DC | PRN

## 2020-09-30 MED ORDER — FENTANYL CITRATE (PF) 250 MCG/5ML IJ SOLN
INTRAMUSCULAR | Status: AC
  Filled 2020-09-30: qty 5

## 2020-09-30 MED ORDER — LIDOCAINE 2% (20 MG/ML) 5 ML SYRINGE
INTRAMUSCULAR | Status: DC | PRN
  Administered 2020-09-30: 60 mg via INTRAVENOUS

## 2020-09-30 MED ORDER — GABAPENTIN 300 MG PO CAPS
300.0000 mg | ORAL_CAPSULE | Freq: Once | ORAL | Status: AC
  Administered 2020-09-30: 300 mg via ORAL
  Filled 2020-09-30: qty 1

## 2020-09-30 MED ORDER — ONDANSETRON HCL 4 MG/2ML IJ SOLN
INTRAMUSCULAR | Status: DC | PRN
  Administered 2020-09-30: 4 mg via INTRAVENOUS

## 2020-09-30 MED ORDER — LIDOCAINE-EPINEPHRINE 1 %-1:100000 IJ SOLN
INTRAMUSCULAR | Status: AC
  Filled 2020-09-30: qty 1

## 2020-09-30 MED ORDER — PHENOL 1.4 % MT LIQD: 1.0000 | OROMUCOSAL | Status: DC | PRN

## 2020-09-30 SURGICAL SUPPLY — 59 items
BASKET BONE COLLECTION (BASKET) ×2 IMPLANT
BLADE CLIPPER SURG (BLADE) ×2 IMPLANT
BLADE SURG 11 STRL SS (BLADE) ×2 IMPLANT
BUR MATCHSTICK NEURO 3.0 LAGG (BURR) IMPLANT
BUR PRECISION FLUTE 5.0 (BURR) IMPLANT
BUR PRECISION MATCH 2.5 (BURR) ×2 IMPLANT
CANISTER SUCT 3000ML PPV (MISCELLANEOUS) ×2 IMPLANT
CNTNR URN SCR LID CUP LEK RST (MISCELLANEOUS) ×1 IMPLANT
CONT SPEC 4OZ STRL OR WHT (MISCELLANEOUS) ×2
COVER BACK TABLE 60X90IN (DRAPES) ×2 IMPLANT
DERMABOND ADVANCED (GAUZE/BANDAGES/DRESSINGS) ×1
DERMABOND ADVANCED .7 DNX12 (GAUZE/BANDAGES/DRESSINGS) ×1 IMPLANT
DRAPE C-ARM 42X72 X-RAY (DRAPES) ×2 IMPLANT
DRAPE C-ARMOR (DRAPES) ×2 IMPLANT
DRAPE LAPAROTOMY 100X72X124 (DRAPES) ×2 IMPLANT
DRAPE SURG 17X23 STRL (DRAPES) IMPLANT
DURAPREP 26ML APPLICATOR (WOUND CARE) ×2 IMPLANT
ELECT CAUTERY BLADE 6.4 (BLADE) ×2 IMPLANT
ELECT REM PT RETURN 9FT ADLT (ELECTROSURGICAL) ×2
ELECTRODE REM PT RTRN 9FT ADLT (ELECTROSURGICAL) ×1 IMPLANT
EXTENDER TAB GUIDE SV 5.5/6.0 (INSTRUMENTS) ×16 IMPLANT
GAUZE 4X4 16PLY RFD (DISPOSABLE) IMPLANT
GAUZE SPONGE 4X4 12PLY STRL (GAUZE/BANDAGES/DRESSINGS) IMPLANT
GLOVE BIOGEL PI IND STRL 7.5 (GLOVE) ×2 IMPLANT
GLOVE BIOGEL PI INDICATOR 7.5 (GLOVE) ×2
GLOVE ECLIPSE 7.5 STRL STRAW (GLOVE) ×4 IMPLANT
GLOVE EXAM NITRILE LRG STRL (GLOVE) IMPLANT
GLOVE EXAM NITRILE XL STR (GLOVE) IMPLANT
GLOVE EXAM NITRILE XS STR PU (GLOVE) IMPLANT
GOWN STRL REUS W/ TWL LRG LVL3 (GOWN DISPOSABLE) ×4 IMPLANT
GOWN STRL REUS W/ TWL XL LVL3 (GOWN DISPOSABLE) ×1 IMPLANT
GOWN STRL REUS W/TWL 2XL LVL3 (GOWN DISPOSABLE) IMPLANT
GOWN STRL REUS W/TWL LRG LVL3 (GOWN DISPOSABLE) ×8
GOWN STRL REUS W/TWL XL LVL3 (GOWN DISPOSABLE) ×2
GUIDEWIRE BLUNT NT 450 (WIRE) ×8 IMPLANT
HEMOSTAT POWDER KIT SURGIFOAM (HEMOSTASIS) ×2 IMPLANT
KIT BASIN OR (CUSTOM PROCEDURE TRAY) ×2 IMPLANT
KIT POSITION SURG JACKSON T1 (MISCELLANEOUS) ×2 IMPLANT
KIT TURNOVER KIT B (KITS) ×2 IMPLANT
MILL MEDIUM DISP (BLADE) IMPLANT
NEEDLE BEVEL TWO-PAK W/1PK (NEEDLE) ×2 IMPLANT
NEEDLE HYPO 22GX1.5 SAFETY (NEEDLE) ×2 IMPLANT
NEEDLE SPNL 18GX3.5 QUINCKE PK (NEEDLE) IMPLANT
NS IRRIG 1000ML POUR BTL (IV SOLUTION) ×2 IMPLANT
PACK LAMINECTOMY NEURO (CUSTOM PROCEDURE TRAY) ×2 IMPLANT
ROD 5.5 CCM PERC 40 (Rod) ×4 IMPLANT
SCREW MAS VOYAGER 6.5X40 (Screw) ×8 IMPLANT
SCREW SET 5.5/6.0MM SOLERA (Screw) ×8 IMPLANT
SPONGE LAP 4X18 RFD (DISPOSABLE) IMPLANT
SPONGE SURGIFOAM ABS GEL 100 (HEMOSTASIS) IMPLANT
STRIP CLOSURE SKIN 1/2X4 (GAUZE/BANDAGES/DRESSINGS) IMPLANT
SUT MNCRL AB 3-0 PS2 18 (SUTURE) ×2 IMPLANT
SUT VIC AB 0 CT1 18XCR BRD8 (SUTURE) ×1 IMPLANT
SUT VIC AB 0 CT1 8-18 (SUTURE) ×2
SUT VIC AB 2-0 CP2 18 (SUTURE) ×2 IMPLANT
TOWEL GREEN STERILE (TOWEL DISPOSABLE) ×2 IMPLANT
TOWEL GREEN STERILE FF (TOWEL DISPOSABLE) ×2 IMPLANT
TRAY FOLEY MTR SLVR 16FR STAT (SET/KITS/TRAYS/PACK) ×2 IMPLANT
WATER STERILE IRR 1000ML POUR (IV SOLUTION) ×2 IMPLANT

## 2020-09-30 NOTE — H&P (Signed)
Surgical H&P Update  HPI: 60 y.o. man with a history of prior discitis in September of 2021 that was successfully treated but with continued back and lower extremity symptoms. He has low back and bilateral lower extremity radiculopathy that has continued to be severe despite multiple non-surgical treatment modalities. No changes in health since he was last seen. Still having symptoms and wishes to proceed with surgery.  PMHx:  Past Medical History:  Diagnosis Date  . Diabetes mellitus without complication (HCC)    Type II  . Dysrhythmia    aFlutter  . Hypertension    FamHx:  Family History  Problem Relation Age of Onset  . Cancer Father   . Lung cancer Father   . Healthy Sister   . Diabetes Neg Hx   . Heart attack Neg Hx   . Gout Neg Hx    SocHx:  reports that he quit smoking about 11 years ago. He quit after 20.00 years of use. He has never used smokeless tobacco. He reports previous alcohol use. He reports that he does not use drugs.  Physical Exam: AOx3, PERRL, FS, TM  Strength 5/5 x4, SILTx4 except decreased in the bilateral L4 distribution  Assesment/Plan: 60 y.o. man with prior discitis and progressive foraminal and canal stenosis, here for MIS decompression and instrumented fusion. Risks, benefits, and alternatives discussed and the patient would like to continue with surgery.  -OR today -3C post-op  Jadene Pierini, MD 09/30/20 12:03 PM

## 2020-09-30 NOTE — Brief Op Note (Signed)
09/30/2020  6:02 PM  PATIENT:  Caleb [name]  60 y.o. male  PRE-OPERATIVE DIAGNOSIS:  Lumbar radiculopathy  POST-OPERATIVE DIAGNOSIS:  Lumbar radiculopathy  PROCEDURE:  Procedure(s): Lumbar three-four Minimally invasive facetectomy and posterolateral instrumented fusion (Left)  SURGEON:  Surgeon(s) and Role:    * Jadene Pierini, MD - Primary  PHYSICIAN ASSISTANT:   ASSISTANTS: none   ANESTHESIA:   general  EBL:  150 mL   BLOOD ADMINISTERED:none  DRAINS: none   LOCAL MEDICATIONS USED:  LIDOCAINE   SPECIMEN:  No Specimen  DISPOSITION OF SPECIMEN:  N/A  COUNTS:  YES  TOURNIQUET:  * No tourniquets in log *  DICTATION: .Note written in EPIC  PLAN OF CARE: Admit to inpatient   PATIENT DISPOSITION:  PACU - hemodynamically stable.   Delay start of Pharmacological VTE agent (>24hrs) due to surgical blood loss or risk of bleeding: yes

## 2020-09-30 NOTE — Anesthesia Procedure Notes (Signed)
Procedure Name: Intubation Date/Time: 09/30/2020 1:35 PM Performed by: Inda Coke, CRNA Pre-anesthesia Checklist: Patient identified, Emergency Drugs available, Suction available and Patient being monitored Patient Re-evaluated:Patient Re-evaluated prior to induction Oxygen Delivery Method: Circle System Utilized Preoxygenation: Pre-oxygenation with 100% oxygen Induction Type: IV induction Ventilation: Mask ventilation without difficulty Laryngoscope Size: Mac and 4 Grade View: Grade I Tube type: Oral Tube size: 7.5 mm Number of attempts: 1 Airway Equipment and Method: Stylet and Oral airway Placement Confirmation: ETT inserted through vocal cords under direct vision,  positive ETCO2 and breath sounds checked- equal and bilateral Secured at: 22 cm Tube secured with: Tape Dental Injury: Teeth and Oropharynx as per pre-operative assessment

## 2020-09-30 NOTE — Transfer of Care (Signed)
Immediate Anesthesia Transfer of Care Note  Patient: Caleb [name]  Procedure(s) Performed: Lumbar three-four Minimally invasive facetectomy and posterolateral instrumented fusion (Left Back)  Patient Location: PACU  Anesthesia Type:General  Level of Consciousness: awake  Airway & Oxygen Therapy: Patient Spontanous Breathing and Patient connected to nasal cannula oxygen  Post-op Assessment: Report given to RN and Post -op Vital signs reviewed and stable  Post vital signs: Reviewed and stable  Last Vitals:  Vitals Value Taken Time  BP 143/62   Temp    Pulse 88 09/30/20 1747  Resp 10 09/30/20 1747  SpO2 100 % 09/30/20 1747  Vitals shown include unvalidated device data.  Last Pain:  Vitals:   09/30/20 0932  TempSrc: Oral         Complications: No complications documented.

## 2020-09-30 NOTE — Op Note (Signed)
PATIENT: Caleb [name]  DAY OF SURGERY: 09/30/20   PRE-OPERATIVE DIAGNOSIS:  Lumbar radiculopathy   POST-OPERATIVE DIAGNOSIS:  Same   PROCEDURE:  L3-L4 minimally invasive laminectomy and complete left facetectomy with bilateral L3-L4 pedicle screw placement and posterolateral fusion at L3-4   SURGEON:  Surgeon(s) and Role:    Jadene Pierini, MD - Primary   ANESTHESIA: ETGA   BRIEF HISTORY: This is a 60 year old man who presented with left greater than right lower extremity pain and low back pain. He had an episode of L3-4 discitis and since that time has had progressively worsening low back and radicular pain, primarily in a left L3 radicular distribution. He responded well to steroid injections at this level. He had known L4-5 canal stenosis at the time of diagnosis of his discitis that was asymptomatic at that time. We discussed that it is most likely that the worsening foraminal disease at L3-4 is likely causing his radicular pain. I recommended treatment at that level but, aside from the normal risks of this procedure, there is a chance he could remain symptomatic or become symptomatic in the future from his L4-5 disease. This was discussed with the patient as well as risks, benefits, and alternatives and wished to proceed with surgery.   OPERATIVE DETAIL:  The patient was taken to the operating room and placed on the OR table in the prone position. A formal time out was performed with two patient identifiers and confirmed the operative site. Anesthesia was induced by the anesthesia team. The operative site was marked, hair was clipped with surgical clippers, the area was then prepped and draped in a sterile fashion.   Fluoroscopy was used to localize the surgical level. The pedicles were marked and used to create skin incisions bilaterally. With fluoro guidance, Jamshidi needles were used to guide K-wires into the bilateral L3 and L4 pedicles. The K wires were then secured with  hemostats and attention turned to decompression.  A [employer] tube was then docked to the left L3-4 facet through the same incision using fluoroscopy. A left L3-4 facetectomy was performed and the left L3 nerve root was decompressed along its entire course. The tube was wanded medially and the decompression was continued medially until reaching the contralateral foramen. Of note, this decompression was greater than what would be required for instrumentation alone.  After decompression was complete, the tube was wanded laterally. The residual lateral facet and transverse process were dissected and decorticated. Bone graft was then placed along this space to perform a posterolateral fusion.  The tube was then removed and hemostasis was obtained during its removal.   Using the previously placed K wires, a tap and then screw with tower were placed bilaterally at L3 and L4. A rod was sized and introduced on both sides, confirmed with fluoroscopy, then final tightened. Hemostasis was again confirmed for both incisions, they were copiously irrigated, and then closed in layers.    EBL:  84mL   DRAINS: none   SPECIMENS: none   Jadene Pierini, MD 09/30/20 12:12 PM

## 2020-09-30 NOTE — Anesthesia Preprocedure Evaluation (Signed)
Anesthesia Evaluation  Patient identified by MRN, date of birth, ID band Patient awake    Reviewed: Allergy & Precautions, NPO status , Patient's Chart, lab work & pertinent test results  Airway Mallampati: II  TM Distance: >3 FB Neck ROM: Full    Dental  (+) Dental Advisory Given   Pulmonary former smoker,    breath sounds clear to auscultation       Cardiovascular hypertension, Pt. on medications and Pt. on home beta blockers + dysrhythmias Atrial Fibrillation  Rhythm:Regular Rate:Normal     Neuro/Psych negative neurological ROS     GI/Hepatic negative GI ROS, Neg liver ROS,   Endo/Other  diabetes, Type 2, Oral Hypoglycemic Agents  Renal/GU negative Renal ROS     Musculoskeletal  (+) Arthritis ,   Abdominal   Peds  Hematology   Anesthesia Other Findings   Reproductive/Obstetrics                             Lab Results  Component Value Date   WBC 3.9 (L) 09/30/2020   HGB 17.2 (H) 09/30/2020   HCT 48.8 09/30/2020   MCV 93.7 09/30/2020   PLT 121 (L) 09/30/2020   Lab Results  Component Value Date   CREATININE 1.04 09/30/2020   BUN 12 09/30/2020   NA 139 09/30/2020   K 4.7 09/30/2020   CL 106 09/30/2020   CO2 25 09/30/2020    Anesthesia Physical Anesthesia Plan  ASA: II  Anesthesia Plan: General   Post-op Pain Management:    Induction: Intravenous  PONV Risk Score and Plan: 2 and Dexamethasone, Ondansetron, Treatment may vary due to age or medical condition and Midazolam  Airway Management Planned: Oral ETT  Additional Equipment: None  Intra-op Plan:   Post-operative Plan: Extubation in OR  Informed Consent: I have reviewed the patients History and Physical, chart, labs and discussed the procedure including the risks, benefits and alternatives for the proposed anesthesia with the patient or authorized representative who has indicated his/her understanding and  acceptance.     Dental advisory given  Plan Discussed with: CRNA  Anesthesia Plan Comments:         Anesthesia Quick Evaluation

## 2020-10-01 LAB — GLUCOSE, CAPILLARY
Glucose-Capillary: 145 mg/dL — ABNORMAL HIGH (ref 70–99)
Glucose-Capillary: 189 mg/dL — ABNORMAL HIGH (ref 70–99)

## 2020-10-01 MED ORDER — METHOCARBAMOL 500 MG PO TABS: 500.0000 mg | ORAL_TABLET | Freq: Four times a day (QID) | ORAL | 2 refills | Status: DC | PRN

## 2020-10-01 MED ORDER — APIXABAN 5 MG PO TABS: 5.0000 mg | ORAL_TABLET | Freq: Two times a day (BID) | ORAL | 5 refills | Status: DC

## 2020-10-01 MED ORDER — OXYCODONE HCL 5 MG PO TABS
5.0000 mg | ORAL_TABLET | ORAL | 0 refills | Status: DC | PRN
Start: 2020-10-01 — End: 2021-07-29

## 2020-10-01 NOTE — Discharge Instructions (Addendum)
Discharge Instructions  No restriction in activities, slowly increase your activity back to normal.   Your incision is closed with dermabond (purple glue). This will naturally fall off over the next 1-2 weeks.   Okay to shower on the day of discharge. Use regular soap and water and try to be gentle when cleaning your incision. Do not scrub directly on incision.  Do not put any creams, lotions, or ointments on incision.  Follow up with Dr. Maurice Small in 2 weeks after discharge. If you do not already have a discharge appointment, please call his office at [phone] to schedule a follow up appointment. If you have any concerns or questions, please call the office and let us know.  Activity Walk each and every day, increasing distance each day. No lifting greater than 5 lbs.  Avoid bending, arching, and twisting. No driving for 2 weeks; may ride as a passenger locally. If provided with back brace, wear when out of bed.  It is not necessary to wear in bed. Diet Resume your normal diet.  Return to Work Will be discussed at you follow up appointment. Call Your Doctor If Any of These Occur Redness, drainage, or swelling at the wound.  Temperature greater than 101 degrees. Severe pain not relieved by pain medication. Incision starts to come apart.

## 2020-10-01 NOTE — Discharge Summary (Signed)
Discharge Summary  Date of Admission: 09/30/2020  Date of Discharge: 10/01/20  Attending Physician: Autumn Patty, MD  Hospital Course: Patient was admitted following an uncomplicated left L3-4 MIS decompression and instrumented fusion. He was recovered in PACU and transferred to [facility]. His hospital course was uncomplicated and the patient was discharged home on POD1. He will follow up in clinic with me in 2 weeks.  Neurologic exam at discharge:  AOx3, PERRL, EOMI, FS, TM Strength 5/5 x4, SILTx4 except LUE 5th digit and L L3 numbness  Discharge diagnosis: Lumbar radiculopathy  Jadene Pierini, MD 10/01/20 8:14 AM

## 2020-10-01 NOTE — Anesthesia Postprocedure Evaluation (Signed)
Anesthesia Post Note  Patient: Caleb [name]  Procedure(s) Performed: Lumbar three-four Minimally invasive facetectomy and posterolateral instrumented fusion (Left Back)     Patient location during evaluation: PACU Anesthesia Type: General Level of consciousness: awake and alert Pain management: pain level controlled Vital Signs Assessment: post-procedure vital signs reviewed and stable Respiratory status: spontaneous breathing, nonlabored ventilation, respiratory function stable and patient connected to nasal cannula oxygen Cardiovascular status: blood pressure returned to baseline and stable Postop Assessment: no apparent nausea or vomiting Anesthetic complications: no   No complications documented.  Last Vitals:  Vitals:   10/01/20 0442 10/01/20 0730  BP: (!) 102/50 (!) 114/58  Pulse: 71 82  Resp: 20 17  Temp: 36.7 C 36.4 C  SpO2: 97% 98%    Last Pain:  Vitals:   10/01/20 1120  TempSrc:   PainSc: 5                  Kennieth Rad

## 2020-10-01 NOTE — Progress Notes (Signed)
Neurosurgery Service Progress Note  Subjective: No acute events overnight, very happy this morning, no radicular pain when he gets up and walks, preop back pain is gone and now with just incisional pain, only complaints are some L anterior thigh numbness and L 5th digit numbness   Objective: Vitals:   09/30/20 1931 09/30/20 2324 10/01/20 0442 10/01/20 0730  BP: (!) 146/73 (!) 120/58 (!) 102/50 (!) 114/58  Pulse: 81 92 71 82  Resp: 20 20 20 17   Temp: 98 F (36.7 C) 97.9 F (36.6 C) 98 F (36.7 C) 97.6 F (36.4 C)  TempSrc: Oral Oral Oral   SpO2: 97% 97% 97% 98%  Weight:      Height:        Physical Exam: AOx3, PERRL, EOMI, FS, TM, Strength 5/5 x4, SILTx4 except L anterior thigh and L 5th digit  Assessment & Plan: 60 y.o. man s/p MIS 3-4 decompression and fusion, recovering well.  -L ulnar numbness is likely positional, full strength, will improve with time, reassured -L thigh numbness likely due to manipulation, should also resolve with time -discharge home today   67  10/01/20 8:09 AM

## 2020-10-01 NOTE — Evaluation (Signed)
Occupational Therapy Evaluation Patient Details Name: Caleb [name] MRN: [id number] DOB: 12-27-1960 Today's Date: 10/01/2020    History of Present Illness 60 y.o. male presenting with lumbar radiculopathy s/p L3-4 PLIF. PMHx significant for DMII and HTN.   Clinical Impression   PTA patient was living in a private residence and was independent with ADLs/IADLs without AD. 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 Patient currently presents near baseline level of function demonstrating all observed ADLs and ADL transfers with Mod I and no AD. Patient reporting 6/10 pain at incision but states BLE pain present PTA has resolved. OT provided education on spinal precautions, home set-up to maximize safety and independence with self-care tasks, and acquisition/use of AE. Patient expressed verbal understanding. Patient does not require continued acute occupational therapy services with OT to sign off at this time.      Follow Up Recommendations  No OT follow up    Equipment Recommendations  None recommended by OT    Recommendations for Other Services       Precautions / Restrictions Precautions Precautions: Back Precaution Booklet Issued: Yes (comment) Precaution Comments: Reviewed written handout. Good adherence to back precautions during ADLs. Required Braces or Orthoses:  (No brace needed) Restrictions Weight Bearing Restrictions: No      Mobility Bed Mobility Overal bed mobility: Needs Assistance Bed Mobility: Rolling;Sidelying to Sit;Sit to Supine Rolling: Modified independent (Device/Increase time) Sidelying to sit: Modified independent (Device/Increase time)   Sit to supine: Modified independent (Device/Increase time)   General bed mobility comments: Good recall and demo of log rolling technique.    Transfers Overall transfer level: Modified independent Equipment used: None                  Balance Overall balance assessment: No apparent balance  deficits (not formally assessed)                                         ADL either performed or assessed with clinical judgement   ADL Overall ADL's : At baseline                                       General ADL Comments: Use of compensatory strategies for adherence to back precautions. Increasd time 2/2 pain.     Vision Baseline Vision/History: Wears glasses Wears Glasses: Reading only Patient Visual Report: No change from baseline       Perception     Praxis      Pertinent Vitals/Pain Pain Assessment: 0-10 Pain Score: 6  Pain Location: Back (incision) Pain Descriptors / Indicators: Sore Pain Intervention(s): Limited activity within patient's tolerance;Monitored during session;Premedicated before session;Repositioned     Hand Dominance Right   Extremity/Trunk Assessment Upper Extremity Assessment Upper Extremity Assessment: Overall WFL for tasks assessed   Lower Extremity Assessment Lower Extremity Assessment: Defer to PT evaluation   Cervical / Trunk Assessment Cervical / Trunk Assessment: Other exceptions Cervical / Trunk Exceptions: s/p spinal surgery.   Communication Communication Communication: No difficulties   Cognition Arousal/Alertness: Awake/alert Behavior During Therapy: WFL for tasks assessed/performed Overall Cognitive Status: Within Functional Limits for tasks assessed  General Comments  Incision clean and dry. Open to RA.    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        OT Problem List: Pain      OT Treatment/Interventions:      OT Goals(Current goals can be found in the care plan section) Acute Rehab OT Goals Patient Stated Goal: To return home. OT Goal Formulation: With patient  OT Frequency:     Barriers to D/C:            Co-evaluation              AM-PAC OT "6 Clicks" Daily Activity     Outcome Measure Help from another person eating meals?: None Help from another person taking care of personal grooming?: None Help from another person toileting, which includes using toliet, bedpan, or urinal?: None Help from another person bathing (including washing, rinsing, drying)?: None Help from another person to put on and taking off regular upper body clothing?: None Help from another person to put on and taking off regular lower body clothing?: None 6 Click Score: 24   End of Session    Activity Tolerance: Patient tolerated treatment well Patient left: in bed;with call bell/phone within reach  OT Visit Diagnosis: Pain Pain - part of body:  (low back)                Time: 2703-5009 OT Time Calculation (min): 13 min Charges:  OT General Charges $OT Visit: 1 Visit OT Evaluation $OT Eval Low Complexity: 1 Low  Makinley Muscato H. OTR/L Supplemental OT, Department of rehab services [phone]  Grady Lucci R H. 10/01/2020, 9:20 AM

## 2020-10-01 NOTE — Progress Notes (Signed)
Patient is discharged from room 3C05 at this time. Alert and in stable condition. IV site d/c'd and instructions read to patient and son with understanding verbalized and all questions answered. Left unit via wheelchair with all belongings at side. 

## 2020-10-01 NOTE — Evaluation (Signed)
Physical Therapy Evaluation Patient Details Name: Caleb [name] MRN: [id number] DOB: [birth year] Today's Date: 10/01/2020   History of Present Illness  Pt is a 60 y/o male presenting with lumbar radiculopathy now s/p L3-4 PLIF on 09/30/2020. PMHx significant for DMII and HTN.  Clinical Impression  Pt admitted with above diagnosis. At the time of PT eval, pt was able to demonstrate transfers and ambulation with gross modified independence to supervision for safety and no AD. Pt was educated on precautions, positioning recommendations, appropriate activity progression, and car transfer. Pt currently with functional limitations due to the deficits listed below (see PT Problem List). Pt will benefit from skilled PT to increase their independence and safety with mobility to allow discharge to the venue listed below.      Follow Up Recommendations No PT follow up;Supervision for mobility/OOB    Equipment Recommendations  None recommended by PT    Recommendations for Other Services       Precautions / Restrictions Precautions Precautions: Back Precaution Booklet Issued: Yes (comment) Precaution Comments: Reviewed written handout. Good adherence to back precautions during ADLs. Required Braces or Orthoses:  (No brace needed order) Restrictions Weight Bearing Restrictions: No      Mobility  Bed Mobility Overal bed mobility: Needs Assistance Bed Mobility: Rolling;Sidelying to Sit;Sit to Sidelying Rolling: Modified independent (Device/Increase time) Sidelying to sit: Modified independent (Device/Increase time)   Sit to supine: Modified independent (Device/Increase time) Sit to sidelying: Supervision General bed mobility comments: HOB flat and rails lowered to simulate home environment. VC's and supervision upon return to supine.    Transfers Overall transfer level: Modified independent Equipment used: None             General transfer comment: Bed height raised to simulate  home. Pt as able to power-up to full stand without difficulty.  Ambulation/Gait Ambulation/Gait assistance: Modified independent (Device/Increase time) Gait Distance (Feet): 300 Feet Assistive device: None Gait Pattern/deviations: Step-through pattern;Decreased stride length;Trunk flexed Gait velocity: Decreased Gait velocity interpretation: 1.31 - 2.62 ft/sec, indicative of limited community ambulator General Gait Details: Pt ambulating with overall good posture and no unsteadiness or LOB noted throughout gait training.  Stairs Stairs: Yes Stairs assistance: Supervision Stair Management: One rail Right;Step to pattern;Forwards Number of Stairs: 1 General stair comments: No assist required.  Wheelchair Mobility    Modified Rankin (Stroke Patients Only)       Balance Overall balance assessment: No apparent balance deficits (not formally assessed)                                           Pertinent Vitals/Pain Pain Assessment: Faces Pain Score: 6  Faces Pain Scale: Hurts even more Pain Location: Back (incision) Pain Descriptors / Indicators: Sore Pain Intervention(s): Limited activity within patient's tolerance;Monitored during session;Repositioned    Home Living Family/patient expects to be discharged to:: Private residence Living Arrangements: Children;Spouse/significant other (Girlfriend and son) Available Help at Discharge: Family;Available 24 hours/day Type of Home: House Home Access: Stairs to enter   [employer] of Steps: 1-2 Home Layout: One level Home Equipment: Hand held shower head;Cane - single point      Prior Function Level of Independence: Independent         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     Hand Dominance   Dominant Hand: Right    Extremity/Trunk Assessment   Upper  Extremity Assessment Upper Extremity Assessment: Overall WFL for tasks assessed    Lower Extremity  Assessment Lower Extremity Assessment: Generalized weakness (Consistent with pre-op diagnosis)    Cervical / Trunk Assessment Cervical / Trunk Assessment: Other exceptions Cervical / Trunk Exceptions: s/p spinal surgery.  Communication   Communication: No difficulties  Cognition Arousal/Alertness: Awake/alert Behavior During Therapy: WFL for tasks assessed/performed Overall Cognitive Status: Within Functional Limits for tasks assessed                                        General Comments General comments (skin integrity, edema, etc.): Incision clean and dry. Open to RA.    Exercises     Assessment/Plan    PT Assessment Patient needs continued PT services  PT Problem List Decreased strength;Decreased activity tolerance;Decreased balance;Decreased mobility;Decreased knowledge of use of DME;Decreased safety awareness;Decreased knowledge of precautions;Pain       PT Treatment Interventions DME instruction;Gait training;Stair training;Functional mobility training;Therapeutic activities;Therapeutic exercise;Neuromuscular re-education;Patient/family education    PT Goals (Current goals can be found in the Care Plan section)  Acute Rehab PT Goals Patient Stated Goal: To return home. PT Goal Formulation: With patient/family Time For Goal Achievement: 10/08/20 Potential to Achieve Goals: Good    Frequency Min 5X/week   Barriers to discharge        Co-evaluation               AM-PAC PT "6 Clicks" Mobility  Outcome Measure Help needed turning from your back to your side while in a flat bed without using bedrails?: None Help needed moving from lying on your back to sitting on the side of a flat bed without using bedrails?: None Help needed moving to and from a bed to a chair (including a wheelchair)?: None Help needed standing up from a chair using your arms (e.g., wheelchair or bedside chair)?: None Help needed to walk in hospital room?: None Help  needed climbing 3-5 steps with a railing? : A Little 6 Click Score: 23    End of Session Equipment Utilized During Treatment: Gait belt Activity Tolerance: Patient tolerated treatment well Patient left: in bed;with call bell/phone within reach;with family/visitor present Nurse Communication: Mobility status PT Visit Diagnosis: Unsteadiness on feet (R26.81);Pain Pain - part of body:  (back/ posterior hips)    Time: 0865-7846 PT Time Calculation (min) (ACUTE ONLY): 16 min   Charges:   PT Evaluation $PT Eval Low Complexity: 1 Low          Conni Slipper, PT, DPT Acute Rehabilitation Services Pager: [phone] Office: [phone]   Marylynn Pearson 10/01/2020, 11:26 AM

## 2020-10-16 ENCOUNTER — Ambulatory Visit: Payer: 59 | Admitting: Internal Medicine

## 2020-10-16 ENCOUNTER — Encounter: Payer: Self-pay | Admitting: Internal Medicine

## 2020-10-16 ENCOUNTER — Other Ambulatory Visit: Payer: Self-pay

## 2020-10-16 VITALS — BP 126/68 | HR 81 | Ht 73.0 in | Wt 240.6 lb

## 2020-10-16 DIAGNOSIS — I483 Typical atrial flutter: Secondary | ICD-10-CM | POA: Diagnosis not present

## 2020-10-16 DIAGNOSIS — I10 Essential (primary) hypertension: Secondary | ICD-10-CM

## 2020-10-16 NOTE — Progress Notes (Signed)
HPI Mr. Mulvehill returns today for followup of his atrial arrhythmias, HTN, and chronic systemic anti-coagulation.I saw him last year and he had reverted from atrial flutter to NSR. His back pain has improved after undergoing spine surgery a few weeks ago.  No Known Allergies   Current Outpatient Medications  Medication Sig Dispense Refill  . apixaban (ELIQUIS) 5 MG TABS tablet Take 1 tablet (5 mg total) by mouth 2 (two) times daily. Restart on 10/05/2020 60 tablet 5  . Blood Glucose Monitoring Suppl (TRUE METRIX AIR GLUCOSE METER) DEVI Check CBG BID 1 each 11  . celecoxib (CELEBREX) 200 MG capsule Take 200 mg by mouth 2 (two) times daily.    . cholecalciferol (VITAMIN D) 25 MCG (1000 UNIT) tablet Take 1,000 Units by mouth daily.    Marland Kitchen gabapentin (NEURONTIN) 300 MG capsule Take 300 mg by mouth 4 (four) times daily.    . metFORMIN (GLUCOPHAGE) 500 MG tablet Take 1 tablet (500 mg total) by mouth 2 (two) times daily with a meal. 60 tablet 11  . methocarbamol (ROBAXIN) 500 MG tablet Take 1 tablet (500 mg total) by mouth every 6 (six) hours as needed for muscle spasms. 30 tablet 2  . metoprolol succinate (TOPROL-XL) 50 MG 24 hr tablet Take 1 tablet (50 mg total) by mouth daily. Take with or immediately following a meal. 90 tablet 1  . oxyCODONE (OXY IR/ROXICODONE) 5 MG immediate release tablet Take 1 tablet (5 mg total) by mouth every 4 (four) hours as needed for moderate pain ((score 4 to 6)). 30 tablet 0  . sildenafil (VIAGRA) 100 MG tablet Take 1 tablet (100 mg total) by mouth daily as needed for erectile dysfunction. 10 tablet 11  . tamsulosin (FLOMAX) 0.4 MG CAPS capsule Take 1 capsule (0.4 mg total) by mouth daily after supper. (Patient taking differently: Take 0.4 mg by mouth every other day.) 90 capsule 3   No current facility-administered medications for this visit.     Past Medical History:  Diagnosis Date  . Diabetes mellitus without complication (HCC)    Type II  . Dysrhythmia     aFlutter  . Hypertension     ROS:   All systems reviewed and negative except as noted in the HPI.   Past Surgical History:  Procedure Laterality Date  . KNEE SURGERY    . ROTATOR CUFF REPAIR Right      Family History  Problem Relation Age of Onset  . Cancer Father   . Lung cancer Father   . Healthy Sister   . Diabetes Neg Hx   . Heart attack Neg Hx   . Gout Neg Hx      Social History   Socioeconomic History  . Marital status: Legally Separated    Spouse name: Not on file  . Number of children: Not on file  . Years of education: Not on file  . Highest education level: Not on file  Occupational History  . Not on file  Tobacco Use  . Smoking status: Former Smoker    Years: 20.00    Quit date: 11/2008    Years since quitting: 11.9  . Smokeless tobacco: Never Used  Vaping Use  . Vaping Use: Never used  Substance and Sexual Activity  . Alcohol use: Not Currently  . Drug use: Never  . Sexual activity: Not on file  Other Topics Concern  . Not on file  Social History Narrative  . Not on file  Social Determinants of Health   Financial Resource Strain: Not on file  Food Insecurity: Not on file  Transportation Needs: Not on file  Physical Activity: Not on file  Stress: Not on file  Social Connections: Not on file  Intimate Partner Violence: Not on file     BP 126/68   Pulse 81   Ht 6\' 1"  (1.854 m)   Wt 240 lb 9.6 oz (109.1 kg)   SpO2 98%   BMI 31.74 kg/m   Physical Exam:  Well appearing 60 yo man, NAD HEENT: Unremarkable Neck:  6 cm JVD, no thyromegally Lymphatics:  No adenopathy Back:  No CVA tenderness Lungs:  Clear with no wheezes HEART:  Regular rate rhythm, no murmurs, no rubs, no clicks Abd:  soft, positive bowel sounds, no organomegally, no rebound, no guarding Ext:  2 plus pulses, no edema, no cyanosis, no clubbing Skin:  No rashes no nodules Neuro:  CN II through XII intact, motor grossly intact  EKG - NSR   Assess/Plan: 1.  Atrial flutter - he remains in NSR. We discussed catheter ablation. I would only recommend this if he has more flutter. He is minimally symptomatic. 2. HTN - his bp is much improved. No change in his meds. 3. Systemic anti-coagulation - he remains on Eliquis. He has had no symptomatic atrial flutter but I remain concerned about asymptomatic flutter and for this reason, I would recommend continuing Eliquis for now.  67 Carolynn Tuley,MD

## 2020-10-16 NOTE — Patient Instructions (Signed)

## 2020-10-23 ENCOUNTER — Ambulatory Visit: Payer: 59 | Admitting: Internal Medicine

## 2020-11-30 ENCOUNTER — Other Ambulatory Visit: Payer: Self-pay | Admitting: Student

## 2020-11-30 NOTE — Telephone Encounter (Signed)
Eliquis 5mg  refill request received. Patient is 60 years old, weight-109.1kg, Crea-1.04 on 09/30/20, Diagnosis-Aflutter, and last seen by Dr. 10/02/20 on 10/16/20. Dose is appropriate based on dosing criteria. Will send in refill to requested pharmacy.

## 2020-12-20 ENCOUNTER — Other Ambulatory Visit: Payer: Self-pay | Admitting: Student

## 2021-02-11 IMAGING — CT CT ANGIO CHEST
2 of 7 series · 18 of 46 positions shown · IV contrast (omnipaque)
Comparison: None.

CLINICAL DATA: Persistent tachycardia.  COVID positive

EXAM:
CT ANGIOGRAPHY CHEST WITH CONTRAST
TECHNIQUE: Multidetector CT imaging of the chest was performed using the
standard protocol during bolus administration of intravenous
contrast. Multiplanar CT image reconstructions and MIPs were
obtained to evaluate the vascular anatomy.
CONTRAST:  75mL OMNIPAQUE IOHEXOL 350 MG/ML SOLN

[Series 6: thins · axial · 0.86mm/px · z∈[+1288,+1580]mm · 15 of 328 slices shown]
[im 18/328  lung]
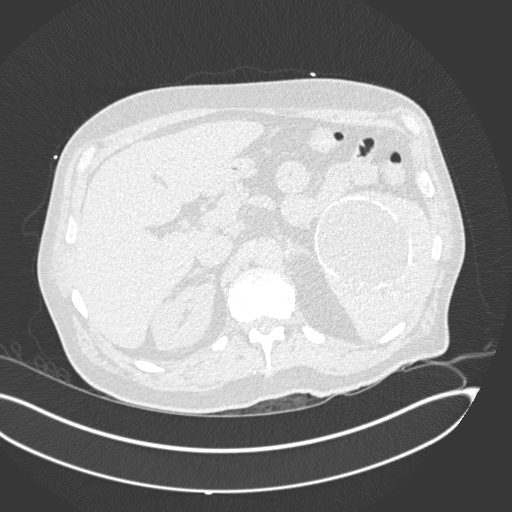
[im 35/328  soft-tissue]
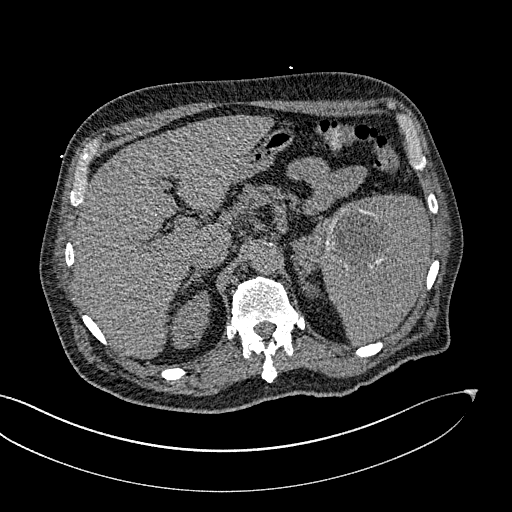
[im 69/328  lung]
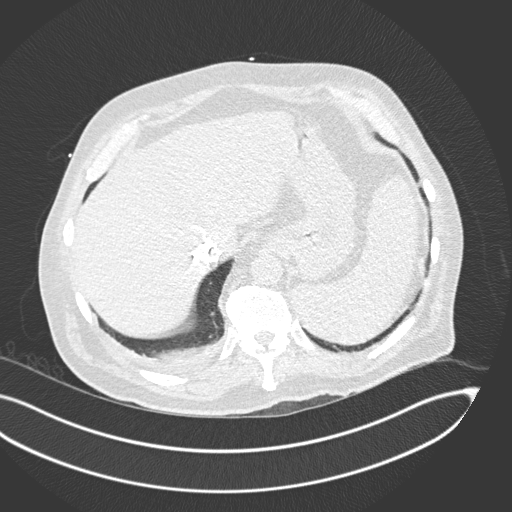
[im 87/328  soft-tissue]
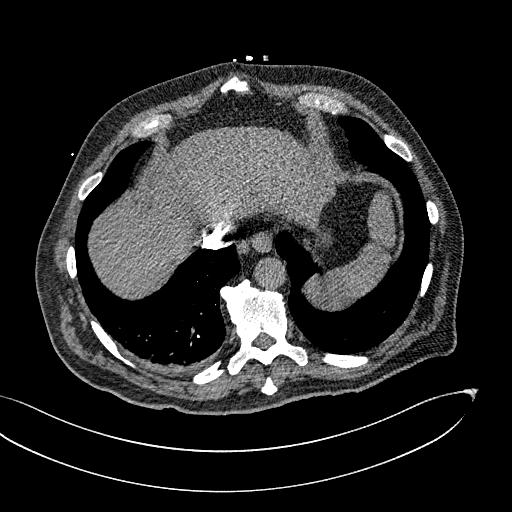
[im 104/328  lung]
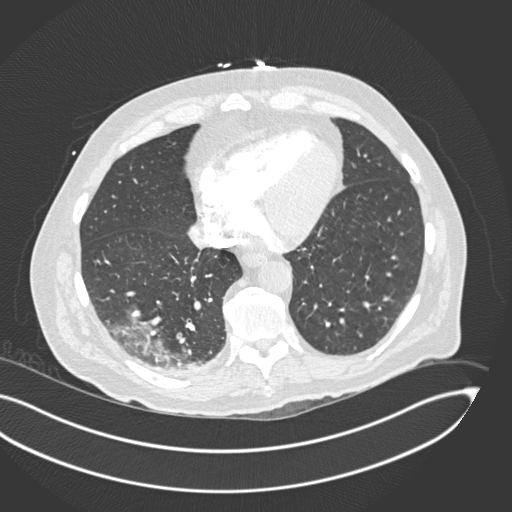
[im 121/328  soft-tissue]
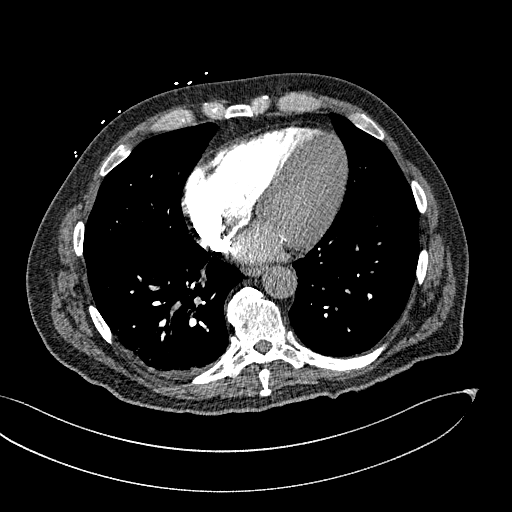
[im 138/328  lung]
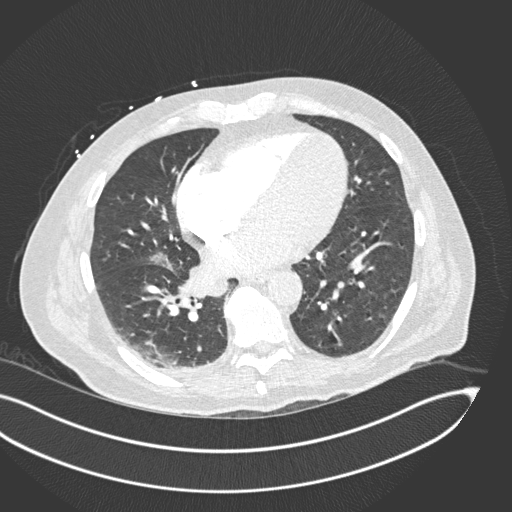
[im 173/328  soft-tissue]
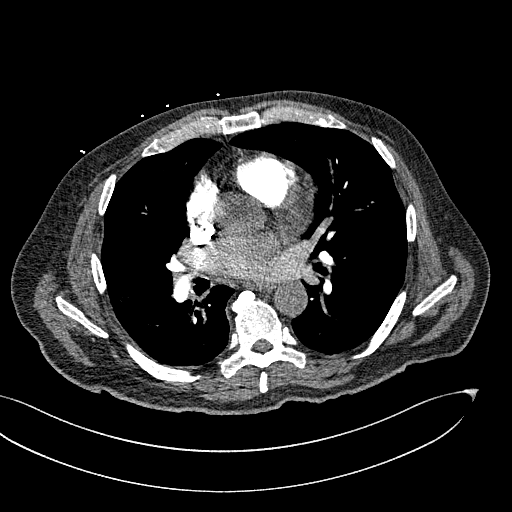
[im 190/328  lung]
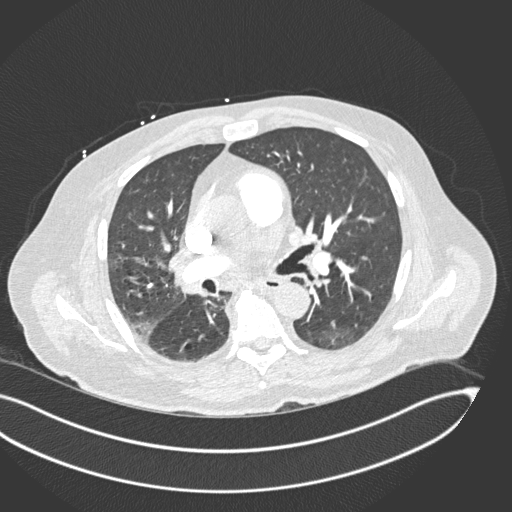
[im 207/328  soft-tissue]
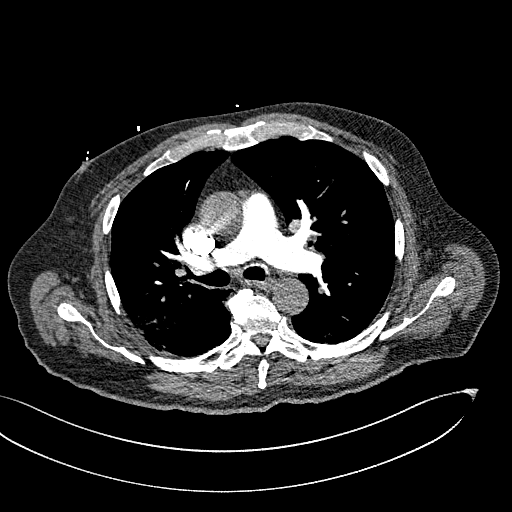
[im 224/328  lung]
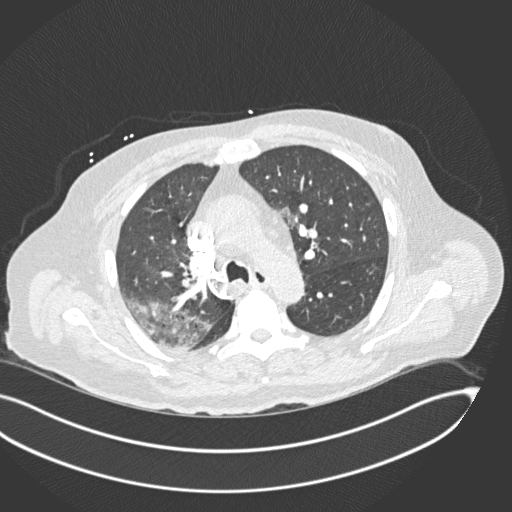
[im 241/328  soft-tissue]
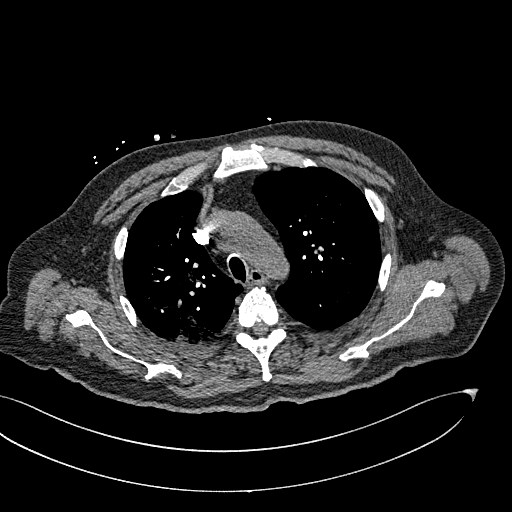
[im 276/328  lung]
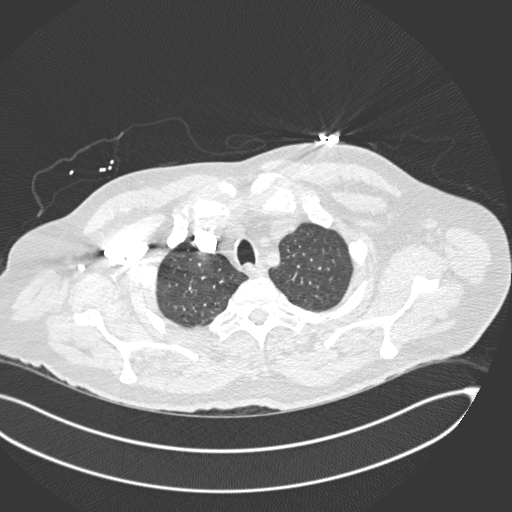
[im 293/328  soft-tissue]
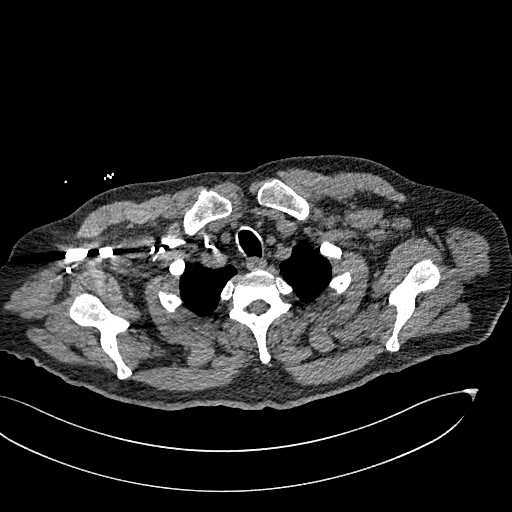
[im 310/328  lung]
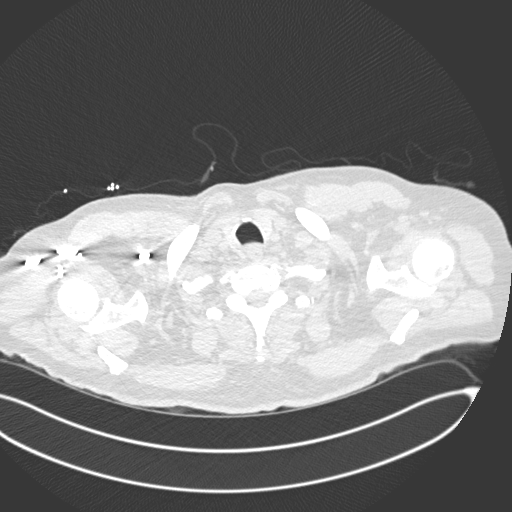

[Series 8: coronal mpr · coronal · 0.66mm/px · 3 of 135 slices shown]
[im 34/135  soft-tissue]
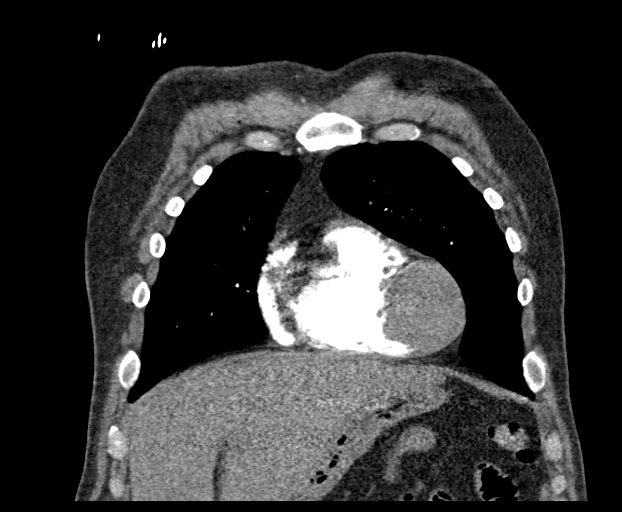
[im 68/135  soft-tissue]
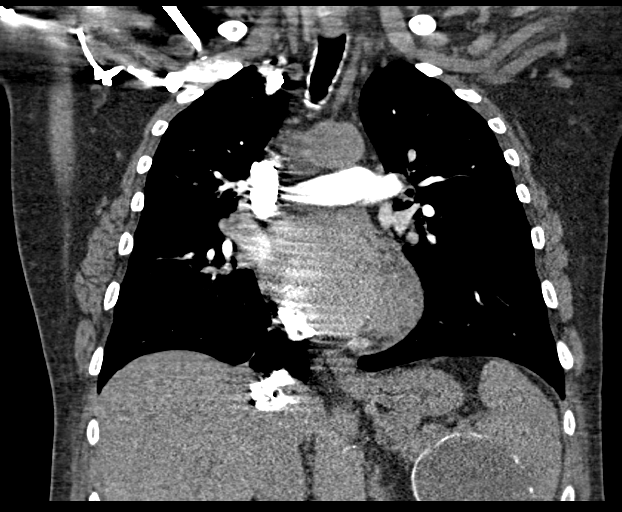
[im 101/135  soft-tissue]
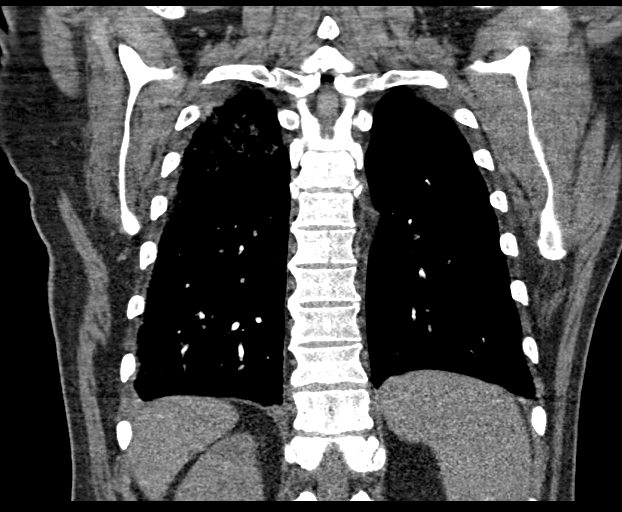

[18 of 46 positions shown; findings below may reference images not displayed]

FINDINGS: Cardiovascular: Satisfactory opacification of the pulmonary arteries
to the segmental level. No evidence of pulmonary embolism. Normal
heart size. No pericardial effusion.

Mediastinum/Nodes: Negative for adenopathy or mass

Lungs/Pleura: Generalized airway thickening with small volume
secretions in the right lower lobe bronchus. Patchy ground-glass
opacity that is asymmetric to the right but seen in all lobes. No
septal thickening, significant effusion, or air leak.

Upper Abdomen: Partially covered peripherally calcified cystic
lesion in the spleen that is most consistent with pseudocyst.
Reference dedicated abdominal imaging.

Musculoskeletal: Spondylosis with multilevel thoracic ankylosis.

Review of the MIP images confirms the above findings.
IMPRESSION: 1. Patchy ground-glass airspace disease correlating with history of
[id number].
2. Negative for pulmonary embolism.

## 2021-06-20 ENCOUNTER — Other Ambulatory Visit: Payer: Self-pay | Admitting: Internal Medicine

## 2021-06-20 DIAGNOSIS — I483 Typical atrial flutter: Secondary | ICD-10-CM

## 2021-06-21 NOTE — Telephone Encounter (Signed)
Eliquis 5mg  refill request received. Patient is 60 years old, weight-109.1kg, Crea-1.04 on 3/23/222, Diagnosis-Aflutter, and last seen by Dr. 06[birth year]1982 on 10/16/2020. Dose is appropriate based on dosing criteria. Will send in refill to requested pharmacy.

## 2021-06-22 ENCOUNTER — Ambulatory Visit: Payer: 59 | Admitting: Physician Assistant

## 2021-06-28 ENCOUNTER — Emergency Department (HOSPITAL_COMMUNITY)
Admission: EM | Admit: 2021-06-28 | Discharge: 2021-06-28 | Disposition: A | Discharge status: Home / Self Care | Payer: 59 | Attending: Emergency Medicine | Admitting: Emergency Medicine

## 2021-06-28 ENCOUNTER — Other Ambulatory Visit: Payer: Self-pay

## 2021-06-28 ENCOUNTER — Encounter (HOSPITAL_COMMUNITY): Payer: Self-pay | Admitting: *Deleted

## 2021-06-28 DIAGNOSIS — I4892 Unspecified atrial flutter: Secondary | ICD-10-CM | POA: Insufficient documentation

## 2021-06-28 DIAGNOSIS — Z7984 Long term (current) use of oral hypoglycemic drugs: Secondary | ICD-10-CM | POA: Diagnosis not present

## 2021-06-28 DIAGNOSIS — Z7901 Long term (current) use of anticoagulants: Secondary | ICD-10-CM | POA: Diagnosis not present

## 2021-06-28 DIAGNOSIS — Z87891 Personal history of nicotine dependence: Secondary | ICD-10-CM | POA: Insufficient documentation

## 2021-06-28 DIAGNOSIS — I10 Essential (primary) hypertension: Secondary | ICD-10-CM | POA: Diagnosis not present

## 2021-06-28 DIAGNOSIS — E119 Type 2 diabetes mellitus without complications: Secondary | ICD-10-CM | POA: Diagnosis not present

## 2021-06-28 DIAGNOSIS — Z8616 Personal history of COVID-19: Secondary | ICD-10-CM | POA: Diagnosis not present

## 2021-06-28 DIAGNOSIS — R002 Palpitations: Secondary | ICD-10-CM | POA: Diagnosis present

## 2021-06-28 DIAGNOSIS — Z79899 Other long term (current) drug therapy: Secondary | ICD-10-CM | POA: Insufficient documentation

## 2021-06-28 LAB — CBC WITH DIFFERENTIAL/PLATELET
Abs Immature Granulocytes: 0.03 10*3/uL (ref 0.00–0.07)
Basophils Absolute: 0.1 10*3/uL (ref 0.0–0.1)
Basophils Relative: 1 %
Eosinophils Absolute: 0 10*3/uL (ref 0.0–0.5)
Eosinophils Relative: 0 %
HCT: 49.2 % (ref 39.0–52.0)
Hemoglobin: 15.9 g/dL (ref 13.0–17.0)
Immature Granulocytes: 0 %
Lymphocytes Relative: 9 %
Lymphs Abs: 0.8 10*3/uL (ref 0.7–4.0)
MCH: 30.3 pg (ref 26.0–34.0)
MCHC: 32.3 g/dL (ref 30.0–36.0)
MCV: 93.7 fL (ref 80.0–100.0)
Monocytes Absolute: 0.5 10*3/uL (ref 0.1–1.0)
Monocytes Relative: 7 %
Neutro Abs: 6.9 10*3/uL (ref 1.7–7.7)
Neutrophils Relative %: 83 %
Platelets: 171 10*3/uL (ref 150–400)
RBC: 5.25 MIL/uL (ref 4.22–5.81)
RDW: 13.9 % (ref 11.5–15.5)
WBC: 8.3 10*3/uL (ref 4.0–10.5)
nRBC: 0 % (ref 0.0–0.2)

## 2021-06-28 LAB — BASIC METABOLIC PANEL
Anion gap: 12 (ref 5–15)
BUN: 8 mg/dL (ref 6–20)
CO2: 22 mmol/L (ref 22–32)
Calcium: 9 mg/dL (ref 8.9–10.3)
Chloride: 103 mmol/L (ref 98–111)
Creatinine, Ser: 1.27 mg/dL — ABNORMAL HIGH (ref 0.61–1.24)
GFR, Estimated: >60 mL/min (ref >60)
Glucose, Bld: 212 mg/dL — ABNORMAL HIGH (ref 70–99)
Potassium: 5.3 mmol/L — ABNORMAL HIGH (ref 3.5–5.1)
Sodium: 137 mmol/L (ref 135–145)

## 2021-06-28 LAB — MAGNESIUM: Magnesium: 1.4 mg/dL — ABNORMAL LOW (ref 1.7–2.4)

## 2021-06-28 MED ORDER — ETOMIDATE 2 MG/ML IV SOLN
10.0000 mg | Freq: Once | INTRAVENOUS | Status: AC
  Administered 2021-06-28: 15:00:00 10 mg via INTRAVENOUS
  Filled 2021-06-28: qty 10

## 2021-06-28 MED ORDER — MAGNESIUM SULFATE 2 GM/50ML IV SOLN
2.0000 g | Freq: Once | INTRAVENOUS | Status: AC
  Administered 2021-06-28: 17:00:00 2 g via INTRAVENOUS
  Filled 2021-06-28: qty 50

## 2021-06-28 MED ORDER — FENTANYL CITRATE PF 50 MCG/ML IJ SOSY
PREFILLED_SYRINGE | INTRAMUSCULAR | Status: AC
  Filled 2021-06-28: qty 1

## 2021-06-28 MED ORDER — METOPROLOL TARTRATE 5 MG/5ML IV SOLN
5.0000 mg | Freq: Once | INTRAVENOUS | Status: AC
  Administered 2021-06-28: 16:00:00 5 mg via INTRAVENOUS
  Filled 2021-06-28: qty 5

## 2021-06-28 MED ORDER — FENTANYL CITRATE PF 50 MCG/ML IJ SOSY: 50.0000 ug | PREFILLED_SYRINGE | Freq: Once | INTRAMUSCULAR | Status: AC

## 2021-06-28 MED ORDER — SODIUM CHLORIDE 0.9 % IV BOLUS
1000.0000 mL | Freq: Once | INTRAVENOUS | Status: AC
  Administered 2021-06-28: 14:00:00 1000 mL via INTRAVENOUS

## 2021-06-28 NOTE — ED Triage Notes (Signed)
Patient presents to ed via [facility] states he was fine this am when he went to work normally eats breakfast didn't eat today, was at work and started feeling lightheadedness was driving self to ED felt worse went to fire station and ems was called  upon ems arrival patient was diaphoretic and found to be in SVT, moved to stretcher and patient converted to  a flutter, has appointment with cards for same, States he had a spinal infection   1 year ago and was found to be in a flutter at that time.

## 2021-06-28 NOTE — ED Provider Notes (Signed)
Pt signed out by Dr. Stevie Kern pending observation after cardioversion.  Pt was still tachy (sinus) after cardioversion, so he was given 5 mg of lopressor IV.  HR now in the 80s with NSR.  Pt is feeling well.  He has been observed and looks good.  He is stable for d/c.  He is to f/u at his previously scheduled appt with cards.  Return if worse.    Jacalyn Lefevre, MD 06/28/21 [phone]

## 2021-06-28 NOTE — ED Notes (Signed)
Discharge instructions reviewed with patient. Patient verbalized understanding of instructions. Follow-up care and medications were reviewed. Patient ambulatory with steady gait. VSS upon discharge.  ?

## 2021-06-28 NOTE — ED Notes (Signed)
Patient is alert oriented  wife at bedside.

## 2021-06-28 NOTE — ED Provider Notes (Addendum)
[facility] EMERGENCY DEPARTMENT Provider Note   CSN: [id number] Arrival date & time: 06/28/21  1227     History Chief Complaint  Patient presents with   Irregular Heart Beat    Caleb [name] is a 60 y.o. male.  Past medical history diabetes, hypertension presenting to the ER with concern for palpitations.  Patient states that over the past couple weeks he has been feeling generally fatigued, has had multiple episodes of feeling lightheaded, near syncopal.  EMS state patient's heart rate was initially to 280s, then when they were moving patient he dropped to 140s.  EKG not concerning for atrial flutter.  Patient states he has a prior history of atrial flutter but is normally in sinus rhythm.  Sees Dr. Lovena Le.  Is on Eliquis.  Has not had any missed doses, takes his medication religiously.  Denies any chest pain or difficulty in breathing associated with these episodes.  Wife reports that she checked his heart rate about 1 week ago and was 130s to 140s.  Called cardiology and has an appointment scheduled with cardiology later this week.  HPI     Past Medical History:  Diagnosis Date   Diabetes mellitus without complication ([place])    Type II   Dysrhythmia    aFlutter   Hypertension     Patient Active Problem List   Diagnosis Date Noted   Lumbar radiculopathy 09/30/2020   Essential hypertension 08/28/2020   COVID-19 virus infection 04/06/2020   Typical atrial flutter (HCC)    Sepsis ([place]) 04/04/2020   Hyponatremia 04/04/2020   Osteomyelitis of lumbar spine ([place]) 04/04/2020   Diskitis 04/03/2020   Diabetes mellitus type 2 with complications, uncontrolled 03/11/2020   Gout 03/11/2020    Past Surgical History:  Procedure Laterality Date   KNEE SURGERY     ROTATOR CUFF REPAIR Right        Family History  Problem Relation Age of Onset   Cancer Father    Lung cancer Father    Healthy Sister    Diabetes Neg Hx    Heart attack Neg Hx    Gout Neg Hx      Social History   Tobacco Use   Smoking status: Former    Years: 20.00    Types: Cigarettes    Quit date: 11/2008    Years since quitting: 12.6   Smokeless tobacco: Never  Vaping Use   Vaping Use: Never used  Substance Use Topics   Alcohol use: Not Currently   Drug use: Never    Home Medications Prior to Admission medications   Medication Sig Start Date End Date Taking? Authorizing Provider  apixaban (ELIQUIS) 5 MG TABS tablet Take 1 tablet by mouth twice daily 06/21/21   Evans Lance, MD  Blood Glucose Monitoring Suppl (TRUE METRIX AIR GLUCOSE METER) DEVI Check CBG BID 03/11/20   Hilts, Legrand Como, MD  celecoxib (CELEBREX) 200 MG capsule Take 200 mg by mouth 2 (two) times daily. 05/26/20   [provider]  cholecalciferol (VITAMIN D) 25 MCG (1000 UNIT) tablet Take 1,000 Units by mouth daily.    [provider]  gabapentin (NEURONTIN) 300 MG capsule Take 300 mg by mouth 4 (four) times daily. 08/22/20   [provider]  metFORMIN (GLUCOPHAGE) 500 MG tablet Take 1 tablet (500 mg total) by mouth 2 (two) times daily with a meal. 03/14/20   Hilts, Legrand Como, MD  methocarbamol (ROBAXIN) 500 MG tablet Take 1 tablet (500 mg total) by mouth  every 6 (six) hours as needed for muscle spasms. 10/01/20   Judith Part, MD  metoprolol succinate (TOPROL-XL) 50 MG 24 hr tablet TAKE 1 TABLET BY MOUTH ONCE DAILY(TAKE WITH OR IMMEDIATELY FOLLOWING A MEAL) 12/23/20   Evans Lance, MD  oxyCODONE (OXY IR/ROXICODONE) 5 MG immediate release tablet Take 1 tablet (5 mg total) by mouth every 4 (four) hours as needed for moderate pain ((score 4 to 6)). 10/01/20   Judith Part, MD  sildenafil (VIAGRA) 100 MG tablet Take 1 tablet (100 mg total) by mouth daily as needed for erectile dysfunction. 08/28/20   Hilts, Legrand Como, MD  tamsulosin (FLOMAX) 0.4 MG CAPS capsule Take 1 capsule (0.4 mg total) by mouth daily after supper. Patient taking differently: Take 0.4 mg by mouth every  other day. 04/15/20   Hilts, Legrand Como, MD    Allergies    Patient has no known allergies.  Review of Systems   Review of Systems  Constitutional:  Positive for fatigue. Negative for chills and fever.  HENT:  Negative for ear pain and sore throat.   Eyes:  Negative for pain and visual disturbance.  Respiratory:  Negative for cough and shortness of breath.   Cardiovascular:  Positive for palpitations. Negative for chest pain.  Gastrointestinal:  Negative for abdominal pain and vomiting.  Genitourinary:  Negative for dysuria and hematuria.  Musculoskeletal:  Negative for arthralgias and back pain.  Skin:  Negative for color change and rash.  Neurological:  Positive for light-headedness. Negative for seizures and syncope.  All other systems reviewed and are negative.  Physical Exam Updated Vital Signs BP (!) 132/103    Pulse (!) 149    Temp 98.7 F (37.1 C) (Oral)    Resp (!) 23    Ht 6\' 1"  (1.854 m)    Wt 122.5 kg    SpO2 97%    BMI 35.62 kg/m   Physical Exam Vitals and nursing note reviewed.  Constitutional:      General: He is not in acute distress.    Appearance: He is well-developed.  HENT:     Head: Normocephalic and atraumatic.  Eyes:     Conjunctiva/sclera: Conjunctivae normal.  Cardiovascular:     Rate and Rhythm: Regular rhythm. Tachycardia present.  Pulmonary:     Effort: Pulmonary effort is normal. No respiratory distress.     Breath sounds: Normal breath sounds.  Abdominal:     Palpations: Abdomen is soft.     Tenderness: There is no abdominal tenderness.  Musculoskeletal:        General: No swelling.     Cervical back: Neck supple.  Skin:    General: Skin is warm and dry.     Capillary Refill: Capillary refill takes less than 2 seconds.  Neurological:     General: No focal deficit present.     Mental Status: He is alert.  Psychiatric:        Mood and Affect: Mood normal.    ED Results / Procedures / Treatments   Labs (all labs ordered are listed, but  only abnormal results are displayed) Labs Reviewed  BASIC METABOLIC PANEL - Abnormal; Notable for the following components:      Result Value   Potassium 5.3 (*)    Glucose, Bld 212 (*)    Creatinine, Ser 1.27 (*)    All other components within normal limits  MAGNESIUM - Abnormal; Notable for the following components:   Magnesium 1.4 (*)    All other  components within normal limits  CBC WITH DIFFERENTIAL/PLATELET    EKG EKG Interpretation  Date/Time:  Monday June 28 2021 14:10:34 EST Ventricular Rate:  148 PR Interval:  66 QRS Duration: 150 QT Interval:  364 QTC Calculation: 572 R Axis:   72 Text Interpretation: Atrial flutter with 2 to 1 block IVCD, consider atypical RBBB Confirmed by Madalyn Rob [phone]) on 06/28/2021 2:25:44 PM  Radiology No results found.  Procedures .Cardioversion  Date/Time: 06/28/2021 3:10 PM Performed by: Lucrezia Starch, MD Authorized by: Lucrezia Starch, MD   Consent:    Consent obtained:  Written   Consent given by:  Patient   Risks discussed:  Cutaneous burn, death, induced arrhythmia and pain   Alternatives discussed:  No treatment Pre-procedure details:    Cardioversion basis:  Elective   Rhythm:  Atrial flutter   Electrode placement:  Anterior-posterior Patient sedated: Yes. Refer to sedation procedure documentation for details of sedation.  Attempt one:    Cardioversion mode:  Synchronous   Shock (Joules):  120   Shock outcome:  Conversion to normal sinus rhythm Post-procedure details:    Patient status:  Awake   Patient tolerance of procedure:  Tolerated well, no immediate complications Comments:     Tolerated well, no complications, one attempt at 120J successful conversion to NSR  .Sedation  Date/Time: 06/28/2021 3:11 PM Performed by: Lucrezia Starch, MD Authorized by: Lucrezia Starch, MD   Consent:    Consent obtained:  Verbal   Consent given by:  Patient   Risks discussed:  Allergic reaction,  dysrhythmia, inadequate sedation, nausea, respiratory compromise necessitating ventilatory assistance and intubation, prolonged sedation necessitating reversal, prolonged hypoxia resulting in organ damage and vomiting Universal protocol:    Immediately prior to procedure, a time out was called: yes   Indications:    Procedure performed:  Cardioversion Pre-sedation assessment:    Time since last food or drink:  Greater than 8 hours   ASA classification: class 2 - patient with mild systemic disease     Mallampati score:  II - soft palate, uvula, fauces visible   Pre-sedation assessments completed and reviewed: airway patency, cardiovascular function, hydration status, mental status, nausea/vomiting, pain level, respiratory function and temperature   Immediate pre-procedure details:    Reassessment: Patient reassessed immediately prior to procedure     Reviewed: vital signs     Verified: bag valve mask available, emergency equipment available, intubation equipment available, IV patency confirmed, oxygen available, reversal medications available and suction available   Procedure details (see MAR for exact dosages):    Preoxygenation:  Nasal cannula   Sedation:  Etomidate   Intended level of sedation: deep   Analgesia:  Fentanyl   Intra-procedure monitoring:  Blood pressure monitoring, cardiac monitor, continuous pulse oximetry, continuous capnometry, frequent LOC assessments and frequent vital sign checks   Intra-procedure events: none     Total Provider sedation time (minutes):  10 Post-procedure details:    Attendance: Constant attendance by certified staff until patient recovered     Recovery: Patient returned to pre-procedure baseline     Post-sedation assessments completed and reviewed: airway patency, cardiovascular function, hydration status, mental status, nausea/vomiting, pain level, respiratory function and temperature     Patient is stable for discharge or admission: yes      Procedure completion:  Tolerated well, no immediate complications .Critical Care Performed by: Lucrezia Starch, MD Authorized by: Lucrezia Starch, MD   Critical care provider statement:    Critical care  time (minutes):  33   Critical care was necessary to treat or prevent imminent or life-threatening deterioration of the following conditions:  Circulatory failure and cardiac failure   Critical care was time spent personally by me on the following activities:  Development of treatment plan with patient or surrogate, discussions with consultants, evaluation of patient's response to treatment, examination of patient, ordering and review of laboratory studies, ordering and review of radiographic studies, ordering and performing treatments and interventions, pulse oximetry, re-evaluation of patient's condition and review of old charts   I assumed direction of critical care for this patient from another provider in my specialty: no     Medications Ordered in ED Medications  magnesium sulfate IVPB 2 g 50 mL (has no administration in time range)  fentaNYL (SUBLIMAZE) injection 50 mcg (has no administration in time range)  sodium chloride 0.9 % bolus 1,000 mL (1,000 mLs Intravenous New Bag/Given 06/28/21 1422)  etomidate (AMIDATE) injection 10 mg (10 mg Intravenous Given 06/28/21 1440)  fentaNYL (SUBLIMAZE) 50 MCG/ML injection (  Given 06/28/21 1440)    ED Course  I have reviewed the triage vital signs and the nursing notes.  Pertinent labs & imaging results that were available during my care of the patient were reviewed by me and considered in my medical decision making (see chart for details).    MDM Rules/Calculators/A&P                         60 year old male presents to ER with concern for tachycardia, lightheadedness.  Has history of atrial flutter.  On arrival here patient in narrow complex regular tachycardia at rate of approximately 150.  Blood pressure normal.  Suspect most likely  atrial flutter with 2 1 conduction.  Reviewed EMS initial rhythm strip.  Discussed with Dr. Elberta Fortis with cardiology.  He suspects this was atrial flutter with one-to-one conduction.  He recommends cardioversion in ER, close follow-up outpatient.  Discussed risks and benefits of ER cardioversion with patient and wife.  Consented to procedure.  Patient tolerated procedure well, no immediate complications, successful cardioversion after 120 J.  Will observe in ER post cardioversion. Discussed with [place]. If patient continues to do well this afternoon and remains in NSR, anticipate discharge.   Final Clinical Impression(s) / ED Diagnoses Final diagnoses:  Atrial flutter, unspecified type (HCC)  Hypomagnesemia    Rx / DC Orders ED Discharge Orders     None        Milagros Loll, MD 06/28/21 1514    Milagros Loll, MD 06/28/21 1515

## 2021-06-28 NOTE — ED Notes (Signed)
Patient is ready for cardiversion time out per Dr. Stevie Kern, resp and pharm at bedside. Patient on o2 with co2  monitor. B/p 101/82 heart rate 122 sat 99% endtital 29

## 2021-06-28 NOTE — Discharge Instructions (Addendum)
Future Appointments  Date Time Provider Department Center  07/02/2021 11:00 AM Tillery, Mariam Dollar, PA-C [id number]

## 2021-07-01 NOTE — Progress Notes (Signed)
PCP:  Lance Bosch, NP Primary Cardiologist: Chilton Si, MD Electrophysiologist: Lewayne Bunting, MD   Caleb [name] is a 60 y.o. male seen today for Lewayne Bunting, MD for acute visit due to recurrent flutter.    Seen in ED with Aflutter RVR at 149. Underwent urgent DCC.  Since last being seen in our clinic the patient reports doing OK.  He has had several brief episodes of fluttering accompanied by SOB. At times they have been self limited, one requiring [facility] as above. Hasn't missed any Eliquis. Denies any recent illness. he denies chest pain, PND, orthopnea, nausea, vomiting, dizziness, syncope, edema, weight gain, or early satiety.  Past Medical History:  Diagnosis Date   Diabetes mellitus without complication (HCC)    Type II   Dysrhythmia    aFlutter   Hypertension    Past Surgical History:  Procedure Laterality Date   KNEE SURGERY     ROTATOR CUFF REPAIR Right     Current Outpatient Medications  Medication Sig Dispense Refill   acetaminophen (TYLENOL) 325 MG tablet Take 650 mg by mouth as needed.     apixaban (ELIQUIS) 5 MG TABS tablet Take 1 tablet by mouth twice daily 60 tablet 5   atorvastatin (LIPITOR) 10 MG tablet Take 10 mg by mouth daily.     Blood Glucose Monitoring Suppl (TRUE METRIX AIR GLUCOSE METER) DEVI Check CBG BID 1 each 11   celecoxib (CELEBREX) 200 MG capsule Take 200 mg by mouth 2 (two) times daily.     gabapentin (NEURONTIN) 300 MG capsule Take 300 mg by mouth 4 (four) times daily.     metFORMIN (GLUCOPHAGE) 500 MG tablet Take 1 tablet (500 mg total) by mouth 2 (two) times daily with a meal. 60 tablet 11   methocarbamol (ROBAXIN) 500 MG tablet Take 1 tablet (500 mg total) by mouth every 6 (six) hours as needed for muscle spasms. 30 tablet 2   metoprolol succinate (TOPROL-XL) 50 MG 24 hr tablet TAKE 1 TABLET BY MOUTH ONCE DAILY(TAKE WITH OR IMMEDIATELY FOLLOWING A MEAL) 90 tablet 3   sildenafil (VIAGRA) 100 MG tablet Take 1 tablet (100 mg total)  by mouth daily as needed for erectile dysfunction. 10 tablet 11   tamsulosin (FLOMAX) 0.4 MG CAPS capsule Take 1 capsule (0.4 mg total) by mouth daily after supper. (Patient taking differently: Take 0.4 mg by mouth every other day.) 90 capsule 3   tiZANidine (ZANAFLEX) 4 MG tablet Take 4 mg by mouth 3 (three) times daily as needed.     cholecalciferol (VITAMIN D) 25 MCG (1000 UNIT) tablet Take 1,000 Units by mouth daily. (Patient not taking: Reported on 07/02/2021)     oxyCODONE (OXY IR/ROXICODONE) 5 MG immediate release tablet Take 1 tablet (5 mg total) by mouth every 4 (four) hours as needed for moderate pain ((score 4 to 6)). (Patient not taking: Reported on 07/02/2021) 30 tablet 0   No current facility-administered medications for this visit.    No Known Allergies  Social History   Socioeconomic History   Marital status: Legally Separated    Spouse name: Not on file   Number of children: Not on file   Years of education: Not on file   Highest education level: Not on file  Occupational History   Not on file  Tobacco Use   Smoking status: Former    Years: 20.00    Types: Cigarettes    Quit date: 11/2008    Years since quitting: 12.6  Smokeless tobacco: Never  Vaping Use   Vaping Use: Never used  Substance and Sexual Activity   Alcohol use: Not Currently   Drug use: Never   Sexual activity: Not on file  Other Topics Concern   Not on file  Social History Narrative   Not on file   Social Determinants of Health   Financial Resource Strain: Not on file  Food Insecurity: Not on file  Transportation Needs: Not on file  Physical Activity: Not on file  Stress: Not on file  Social Connections: Not on file  Intimate Partner Violence: Not on file     Review of Systems: All other systems reviewed and are otherwise negative except as noted above.  Physical Exam: Vitals:   07/02/21 1059  BP: 130/80  Pulse: 99  SpO2: 96%  Weight: 253 lb 6.4 oz (114.9 kg)  Height: 6\' 1"   (1.854 m)    GEN- The patient is well appearing, alert and oriented x 3 today.   HEENT: normocephalic, atraumatic; sclera clear, conjunctiva pink; hearing intact; oropharynx clear; neck supple, no JVP Lymph- no cervical lymphadenopathy Lungs- Clear to ausculation bilaterally, normal work of breathing.  No wheezes, rales, rhonchi Heart- Regular rate and rhythm, no murmurs, rubs or gallops, PMI not laterally displaced GI- soft, non-tender, non-distended, bowel sounds present, no hepatosplenomegaly Extremities- no clubbing, cyanosis, or edema; DP/PT/radial pulses 2+ bilaterally MS- no significant deformity or atrophy Skin- warm and dry, no rash or lesion Psych- euthymic mood, full affect Neuro- strength and sensation are intact  EKG is ordered. Personal review of EKG from today shows NSR at 99 bpm  Additional studies reviewed include: Previous EP office notes and ED notes.   Assessment and Plan:  1. Atrial flutter, typical Recent recurrence. Dr. has previously recommended ablation with recurrence, will schedule pt to see. Continue eliquis  Increase metoprolol to 75 mg daily Give diltiazem 30 mg to use prn for breakthrough.   2. HTN Stable on current regimen   Follow up with Dr. Ladona Ridgel in 4 weeks to discuss ablation. Pt is very interested.    Ladona Ridgel, PA-C  07/02/21 11:16 AM

## 2021-07-02 ENCOUNTER — Encounter: Payer: Self-pay | Admitting: Student

## 2021-07-02 ENCOUNTER — Ambulatory Visit (INDEPENDENT_AMBULATORY_CARE_PROVIDER_SITE_OTHER): Payer: 59 | Admitting: Student

## 2021-07-02 ENCOUNTER — Other Ambulatory Visit: Payer: Self-pay

## 2021-07-02 VITALS — BP 130/80 | HR 99 | Ht 73.0 in | Wt 253.4 lb

## 2021-07-02 DIAGNOSIS — I483 Typical atrial flutter: Secondary | ICD-10-CM | POA: Diagnosis not present

## 2021-07-02 DIAGNOSIS — I10 Essential (primary) hypertension: Secondary | ICD-10-CM | POA: Diagnosis not present

## 2021-07-02 MED ORDER — DILTIAZEM HCL 30 MG PO TABS: 30.0000 mg | ORAL_TABLET | ORAL | 3 refills | Status: DC | PRN

## 2021-07-02 MED ORDER — METOPROLOL SUCCINATE ER 50 MG PO TB24: ORAL_TABLET | ORAL | 3 refills | Status: DC

## 2021-07-02 NOTE — Patient Instructions (Signed)
Medication Instructions:  Your physician has recommended you make the following change in your medication:   INCREASE: Metoprolol to 75mg  daily START: Diltiazem 30mg  as needed  *If you need a refill on your cardiac medications before your next appointment, please call your pharmacy*   Lab Work: None If you have labs (blood work) drawn today and your tests are completely normal, you will receive your results only by: MyChart Message (if you have MyChart) OR A paper copy in the mail If you have any lab test that is abnormal or we need to change your treatment, we will call you to review the results.   Follow-Up: At [facility], you and your health needs are our priority.  As part of our continuing mission to provide you with exceptional heart care, we have created designated Provider Care Teams.  These Care Teams include your primary Cardiologist (physician) and Advanced Practice Providers (APPs -  Physician Assistants and Nurse Practitioners) who all work together to provide you with the care you need, when you need it.  Your next appointment:   1 month(s)  The format for your next appointment:   In Person  Provider:   , MD

## 2021-07-29 ENCOUNTER — Encounter: Payer: Self-pay | Admitting: Internal Medicine

## 2021-07-29 ENCOUNTER — Ambulatory Visit: Payer: 59 | Admitting: Internal Medicine

## 2021-07-29 ENCOUNTER — Other Ambulatory Visit: Payer: Self-pay

## 2021-07-29 VITALS — BP 118/82 | HR 154 | Ht 73.0 in | Wt 260.2 lb

## 2021-07-29 DIAGNOSIS — I483 Typical atrial flutter: Secondary | ICD-10-CM

## 2021-07-29 LAB — CBC WITH DIFFERENTIAL/PLATELET
Basophils Absolute: 0 10*3/uL (ref 0.0–0.2)
Basos: 1 %
EOS (ABSOLUTE): 0.1 10*3/uL (ref 0.0–0.4)
Eos: 2 %
Hematocrit: 45.5 % (ref 37.5–51.0)
Hemoglobin: 15.5 g/dL (ref 13.0–17.7)
Lymphocytes Absolute: 1.3 10*3/uL (ref 0.7–3.1)
Lymphs: 32 %
MCH: 29.9 pg (ref 26.6–33.0)
MCHC: 34.1 g/dL (ref 31.5–35.7)
MCV: 88 fL (ref 79–97)
Monocytes Absolute: 0.4 10*3/uL (ref 0.1–0.9)
Monocytes: 10 %
Neutrophils Absolute: 2.2 10*3/uL (ref 1.4–7.0)
Neutrophils: 55 %
Platelets: 150 10*3/uL (ref 150–450)
RBC: 5.18 x10E6/uL (ref 4.14–5.80)
RDW: 16.1 % — ABNORMAL HIGH (ref 11.6–15.4)
WBC: 4 10*3/uL (ref 3.4–10.8)

## 2021-07-29 LAB — BASIC METABOLIC PANEL
BUN/Creatinine Ratio: 11 (ref 10–24)
BUN: 9 mg/dL (ref 8–27)
CO2: 25 mmol/L (ref 20–29)
Calcium: 9.1 mg/dL (ref 8.6–10.2)
Chloride: 104 mmol/L (ref 96–106)
Creatinine, Ser: 0.82 mg/dL (ref 0.76–1.27)
Glucose: 206 mg/dL — ABNORMAL HIGH (ref 70–99)
Potassium: 4.4 mmol/L (ref 3.5–5.2)
Sodium: 138 mmol/L (ref 134–144)
eGFR: 101 mL/min/{1.73_m2} (ref >59)

## 2021-07-29 NOTE — Patient Instructions (Addendum)
Medication Instructions:  Your physician recommends that you continue on your current medications as directed. Please refer to the Current Medication list given to you today.  Labwork: You will get lab work today:  CBC and BMP  Testing/Procedures: None ordered.  Follow-Up:  SEE INSTRUCTION LETTER  Cardiac Ablation Cardiac ablation is a procedure to destroy, or ablate, a small amount of heart tissue in very specific places. The heart has many electrical connections. Sometimes these connections are abnormal and can cause the heart to beat very fast or irregularly. Ablating some of the areas that cause problems can improve the heart's rhythm or return it to normal. Ablation may be done for people who: Have Wolff-Parkinson-White syndrome. Have fast heart rhythms (tachycardia). Have taken medicines for an abnormal heart rhythm (arrhythmia) that were not effective or caused side effects. Have a high-risk heartbeat that may be life-threatening. During the procedure, a small incision is made in the neck or the groin, and a long, thin tube (catheter) is inserted into the incision and moved to the heart. Small devices (electrodes) on the tip of the catheter will send out electrical currents. A type of X-ray (fluoroscopy) will be used to help guide the catheter and to provide images of the heart. Tell a health care provider about: Any allergies you have. All medicines you are taking, including vitamins, herbs, eye drops, creams, and over-the-counter medicines. Any problems you or family members have had with anesthetic medicines. Any blood disorders you have. Any surgeries you have had. Any medical conditions you have, such as kidney failure. Whether you are pregnant or may be pregnant. What are the risks? Generally, this is a safe procedure. However, problems may occur, including: Infection. Bruising and bleeding at the catheter insertion site. Bleeding into the chest, especially into the sac  that surrounds the heart. This is a serious complication. Stroke or blood clots. Damage to nearby structures or organs. Allergic reaction to medicines or dyes. Need for a permanent pacemaker if the normal electrical system is damaged. A pacemaker is a small computer that sends electrical signals to the heart and helps your heart beat normally. The procedure not being fully effective. This may not be recognized until months later. Repeat ablation procedures are sometimes done. What happens before the procedure? Medicines Ask your health care provider about: Changing or stopping your regular medicines. This is especially important if you are taking diabetes medicines or blood thinners. Taking medicines such as aspirin and ibuprofen. These medicines can thin your blood. Do not take these medicines unless your health care provider tells you to take them. Taking over-the-counter medicines, vitamins, herbs, and supplements. General instructions Follow instructions from your health care provider about eating or drinking restrictions. Plan to have someone take you home from the hospital or clinic. If you will be going home right after the procedure, plan to have someone with you for 24 hours. Ask your health care provider what steps will be taken to prevent infection. What happens during the procedure?  An IV will be inserted into one of your veins. You will be given a medicine to help you relax (sedative). The skin on your neck or groin will be numbed. An incision will be made in your neck or your groin. A needle will be inserted through the incision and into a large vein in your neck or groin. A catheter will be inserted into the needle and moved to your heart. Dye may be injected through the catheter to help your surgeon see  the area of the heart that needs treatment. Electrical currents will be sent from the catheter to ablate heart tissue in desired areas. There are three types of energy that  may be used to do this: Heat (radiofrequency energy). Laser energy. Extreme cold (cryoablation). When the tissue has been ablated, the catheter will be removed. Pressure will be held on the insertion area to prevent a lot of bleeding. A bandage (dressing) will be placed over the insertion area. The exact procedure may vary among health care providers and hospitals. What happens after the procedure? Your blood pressure, heart rate, breathing rate, and blood oxygen level will be monitored until you leave the hospital or clinic. Your insertion area will be monitored for bleeding. You will need to lie still for a few hours to ensure that you do not bleed from the insertion area. Do not drive for 24 hours or as long as told by your health care provider. Summary Cardiac ablation is a procedure to destroy, or ablate, a small amount of heart tissue using an electrical current. This procedure can improve the heart rhythm or return it to normal. Tell your health care provider about any medical conditions you may have and all medicines you are taking to treat them. This is a safe procedure, but problems may occur. Problems may include infection, bruising, damage to nearby organs or structures, or allergic reactions to medicines. Follow your health care provider's instructions about eating and drinking before the procedure. You may also be told to change or stop some of your medicines. After the procedure, do not drive for 24 hours or as long as told by your health care provider. This information is not intended to replace advice given to you by your health care provider. Make sure you discuss any questions you have with your health care provider. Document Revised: 05/06/2019 Document Reviewed: 05/06/2019 Elsevier Patient Education  [employer].

## 2021-07-29 NOTE — H&P (View-Only) (Signed)
HPI [name] returns today for followup. He is a pleasant 61 yo man with a h/o HTN and atrial flutter with a RVR. He had recurrent flutter a few weeks ago and underwent DCCV and has had ERAF. He has minimal palpitations but feels badly. No chest pain. He has class 2-3 dyspnea. No edema. No syncope. No Known Allergies   Current Outpatient Medications  Medication Sig Dispense Refill   acetaminophen (TYLENOL) 325 MG tablet Take 650 mg by mouth as needed.     apixaban (ELIQUIS) 5 MG TABS tablet Take 1 tablet by mouth twice daily 60 tablet 5   atorvastatin (LIPITOR) 10 MG tablet Take 10 mg by mouth daily.     Blood Glucose Monitoring Suppl (TRUE METRIX AIR GLUCOSE METER) DEVI Check CBG BID 1 each 11   cholecalciferol (VITAMIN D) 25 MCG (1000 UNIT) tablet Take 1,000 Units by mouth daily.     diltiazem (CARDIZEM) 30 MG tablet Take 1 tablet (30 mg total) by mouth as needed. 90 tablet 3   metFORMIN (GLUCOPHAGE) 500 MG tablet Take 1 tablet (500 mg total) by mouth 2 (two) times daily with a meal. 60 tablet 11   metoprolol succinate (TOPROL-XL) 50 MG 24 hr tablet TAKE 1 1/2 TABLET BY MOUTH ONCE DAILY(TAKE WITH OR IMMEDIATELY FOLLOWING A MEAL) 135 tablet 3   No current facility-administered medications for this visit.     Past Medical History:  Diagnosis Date   Diabetes mellitus without complication (HCC)    Type II   Dysrhythmia    aFlutter   Hypertension     ROS:   All systems reviewed and negative except as noted in the HPI.   Past Surgical History:  Procedure Laterality Date   KNEE SURGERY     ROTATOR CUFF REPAIR Right      Family History  Problem Relation Age of Onset   Cancer Father    Lung cancer Father    Healthy Sister    Diabetes Neg Hx    Heart attack Neg Hx    Gout Neg Hx      Social History   Socioeconomic History   Marital status: Legally Separated    Spouse name: Not on file   Number of children: Not on file   Years of education: Not on file    Highest education level: Not on file  Occupational History   Not on file  Tobacco Use   Smoking status: Former    Years: 20.00    Types: Cigarettes    Quit date: 11/2008    Years since quitting: 12.7   Smokeless tobacco: Never  Vaping Use   Vaping Use: Never used  Substance and Sexual Activity   Alcohol use: Not Currently   Drug use: Never   Sexual activity: Not on file  Other Topics Concern   Not on file  Social History Narrative   Not on file   Social Determinants of Health   Financial Resource Strain: Not on file  Food Insecurity: Not on file  Transportation Needs: Not on file  Physical Activity: Not on file  Stress: Not on file  Social Connections: Not on file  Intimate Partner Violence: Not on file     BP 118/82    Pulse (!) 154    Ht 6\' 1"  (1.854 m)    Wt 260 lb 3.2 oz (118 kg)    SpO2 96%    BMI 34.33 kg/m   Physical Exam:  Well  appearing NAD HEENT: Unremarkable Neck:  No JVD, no thyromegally Lymphatics:  No adenopathy Back:  No CVA tenderness Lungs:  Clear with no wheezes HEART:  Regular tachy rhythm, no murmurs, no rubs, no clicks Abd:  soft, positive bowel sounds, no organomegally, no rebound, no guarding Ext:  2 plus pulses, no edema, no cyanosis, no clubbing Skin:  No rashes no nodules Neuro:  CN II through XII intact, motor grossly intact  EKG - atrial flutter with a RVR, probably clockwise   Assess/Plan:  Atrial flutter - this is now a recurrent problem with 3 episodes. I have discussed the indications/risks/benefits/goals/expectations of the procedure and he wishes to proceed. Coags - I asked him not to miss any of his blood thinners. He will hold Eliquis on the morning of the procedure. Chronic diastolic heart failure -he has class 3 dyspnea.  HTN - his bp is well controlled. We will follow.  Sharlot Gowda Azarria Balint,MD

## 2021-07-29 NOTE — Progress Notes (Signed)
° ° ° ° °HPI °[name] returns today for followup. He is a pleasant 60 yo man with a h/o HTN and atrial flutter with a RVR. He had recurrent flutter a few weeks ago and underwent DCCV and has had ERAF. He has minimal palpitations but feels badly. No chest pain. He has class 2-3 dyspnea. No edema. No syncope. °No Known Allergies ° ° °Current Outpatient Medications  °Medication Sig Dispense Refill  ° acetaminophen (TYLENOL) 325 MG tablet Take 650 mg by mouth as needed.    ° apixaban (ELIQUIS) 5 MG TABS tablet Take 1 tablet by mouth twice daily 60 tablet 5  ° atorvastatin (LIPITOR) 10 MG tablet Take 10 mg by mouth daily.    ° Blood Glucose Monitoring Suppl (TRUE METRIX AIR GLUCOSE METER) DEVI Check CBG BID 1 each 11  ° cholecalciferol (VITAMIN D) 25 MCG (1000 UNIT) tablet Take 1,000 Units by mouth daily.    ° diltiazem (CARDIZEM) 30 MG tablet Take 1 tablet (30 mg total) by mouth as needed. 90 tablet 3  ° metFORMIN (GLUCOPHAGE) 500 MG tablet Take 1 tablet (500 mg total) by mouth 2 (two) times daily with a meal. 60 tablet 11  ° metoprolol succinate (TOPROL-XL) 50 MG 24 hr tablet TAKE 1 1/2 TABLET BY MOUTH ONCE DAILY(TAKE WITH OR IMMEDIATELY FOLLOWING A MEAL) 135 tablet 3  ° °No current facility-administered medications for this visit.  ° ° ° °Past Medical History:  °Diagnosis Date  ° Diabetes mellitus without complication (HCC)   ° Type II  ° Dysrhythmia   ° aFlutter  ° Hypertension   ° ° °ROS: ° ° All systems reviewed and negative except as noted in the HPI. ° ° °Past Surgical History:  °Procedure Laterality Date  ° KNEE SURGERY    ° ROTATOR CUFF REPAIR Right   ° ° ° °Family History  °Problem Relation Age of Onset  ° Cancer Father   ° Lung cancer Father   ° Healthy Sister   ° Diabetes Neg Hx   ° Heart attack Neg Hx   ° Gout Neg Hx   ° ° ° °Social History  ° °Socioeconomic History  ° Marital status: Legally Separated  °  Spouse name: Not on file  ° Number of children: Not on file  ° Years of education: Not on file  °  Highest education level: Not on file  °Occupational History  ° Not on file  °Tobacco Use  ° Smoking status: Former  °  Years: 20.00  °  Types: Cigarettes  °  Quit date: 11/2008  °  Years since quitting: 12.7  ° Smokeless tobacco: Never  °Vaping Use  ° Vaping Use: Never used  °Substance and Sexual Activity  ° Alcohol use: Not Currently  ° Drug use: Never  ° Sexual activity: Not on file  °Other Topics Concern  ° Not on file  °Social History Narrative  ° Not on file  ° °Social Determinants of Health  ° °Financial Resource Strain: Not on file  °Food Insecurity: Not on file  °Transportation Needs: Not on file  °Physical Activity: Not on file  °Stress: Not on file  °Social Connections: Not on file  °Intimate Partner Violence: Not on file  ° ° ° °BP 118/82    Pulse (!) 154    Ht 6' 1" (1.854 m)    Wt 260 lb 3.2 oz (118 kg)    SpO2 96%    BMI 34.33 kg/m²  ° °Physical Exam: ° °Well   appearing NAD HEENT: Unremarkable Neck:  No JVD, no thyromegally Lymphatics:  No adenopathy Back:  No CVA tenderness Lungs:  Clear with no wheezes HEART:  Regular tachy rhythm, no murmurs, no rubs, no clicks Abd:  soft, positive bowel sounds, no organomegally, no rebound, no guarding Ext:  2 plus pulses, no edema, no cyanosis, no clubbing Skin:  No rashes no nodules Neuro:  CN II through XII intact, motor grossly intact  EKG - atrial flutter with a RVR, probably clockwise   Assess/Plan:  Atrial flutter - this is now a recurrent problem with 3 episodes. I have discussed the indications/risks/benefits/goals/expectations of the procedure and he wishes to proceed. Coags - I asked him not to miss any of his blood thinners. He will hold Eliquis on the morning of the procedure. Chronic diastolic heart failure -he has class 3 dyspnea.  HTN - his bp is well controlled. We will follow.  Caleb [name] [name],MD

## 2021-07-30 NOTE — Pre-Procedure Instructions (Signed)
Instructed patient on the following items: Arrival time 0530 Nothing to eat or drink after midnight No meds AM of procedure Responsible person to drive you home and stay with you for 24 hrs  Have you missed any doses of anti-coagulant eliquis- hasn't missed any doses   

## 2021-08-02 ENCOUNTER — Ambulatory Visit (HOSPITAL_COMMUNITY)
Admission: RE | Admit: 2021-08-02 | Discharge: 2021-08-02 | Disposition: A | Discharge status: Home / Self Care | Payer: 59 | Attending: Internal Medicine | Admitting: Internal Medicine

## 2021-08-02 ENCOUNTER — Other Ambulatory Visit: Payer: Self-pay

## 2021-08-02 ENCOUNTER — Encounter (HOSPITAL_COMMUNITY)
Admission: RE | Disposition: A | Discharge status: Home / Self Care | Payer: Self-pay | Source: Home / Self Care | Attending: Internal Medicine

## 2021-08-02 DIAGNOSIS — E119 Type 2 diabetes mellitus without complications: Secondary | ICD-10-CM | POA: Insufficient documentation

## 2021-08-02 DIAGNOSIS — I4892 Unspecified atrial flutter: Secondary | ICD-10-CM | POA: Diagnosis not present

## 2021-08-02 DIAGNOSIS — I5032 Chronic diastolic (congestive) heart failure: Secondary | ICD-10-CM | POA: Diagnosis not present

## 2021-08-02 DIAGNOSIS — Z7984 Long term (current) use of oral hypoglycemic drugs: Secondary | ICD-10-CM | POA: Diagnosis not present

## 2021-08-02 DIAGNOSIS — Z7901 Long term (current) use of anticoagulants: Secondary | ICD-10-CM | POA: Diagnosis not present

## 2021-08-02 DIAGNOSIS — I110 Hypertensive heart disease with heart failure: Secondary | ICD-10-CM | POA: Diagnosis not present

## 2021-08-02 HISTORY — PX: A-FLUTTER ABLATION: EP1230

## 2021-08-02 LAB — GLUCOSE, CAPILLARY
Glucose-Capillary: 137 mg/dL — ABNORMAL HIGH (ref 70–99)
Glucose-Capillary: 116 mg/dL — ABNORMAL HIGH (ref 70–99)

## 2021-08-02 SURGERY — A-FLUTTER ABLATION

## 2021-08-02 MED ORDER — FENTANYL CITRATE (PF) 100 MCG/2ML IJ SOLN
INTRAMUSCULAR | Status: AC
  Filled 2021-08-02: qty 2

## 2021-08-02 MED ORDER — HEPARIN (PORCINE) IN NACL 1000-0.9 UT/500ML-% IV SOLN
INTRAVENOUS | Status: AC
  Filled 2021-08-02: qty 500

## 2021-08-02 MED ORDER — HEPARIN (PORCINE) IN NACL 1000-0.9 UT/500ML-% IV SOLN
INTRAVENOUS | Status: DC | PRN
  Administered 2021-08-02: 500 mL

## 2021-08-02 MED ORDER — MIDAZOLAM HCL 5 MG/5ML IJ SOLN
INTRAMUSCULAR | Status: DC | PRN
  Administered 2021-08-02 (×5): 1 mg via INTRAVENOUS
  Administered 2021-08-02: 2 mg via INTRAVENOUS
  Administered 2021-08-02 (×5): 1 mg via INTRAVENOUS

## 2021-08-02 MED ORDER — ONDANSETRON HCL 4 MG/2ML IJ SOLN: 4.0000 mg | Freq: Four times a day (QID) | INTRAMUSCULAR | Status: DC | PRN

## 2021-08-02 MED ORDER — SODIUM CHLORIDE 0.9 % IV SOLN: INTRAVENOUS | Status: DC

## 2021-08-02 MED ORDER — ACETAMINOPHEN 325 MG PO TABS: 650.0000 mg | ORAL_TABLET | ORAL | Status: DC | PRN

## 2021-08-02 MED ORDER — BUPIVACAINE HCL (PF) 0.25 % IJ SOLN
INTRAMUSCULAR | Status: DC | PRN
  Administered 2021-08-02: 40 mL

## 2021-08-02 MED ORDER — MIDAZOLAM HCL 5 MG/5ML IJ SOLN
INTRAMUSCULAR | Status: AC
  Filled 2021-08-02: qty 5

## 2021-08-02 MED ORDER — SODIUM CHLORIDE 0.9% FLUSH: 3.0000 mL | INTRAVENOUS | Status: DC | PRN

## 2021-08-02 MED ORDER — FENTANYL CITRATE (PF) 100 MCG/2ML IJ SOLN
INTRAMUSCULAR | Status: DC | PRN
  Administered 2021-08-02 (×2): 12.5 ug via INTRAVENOUS
  Administered 2021-08-02: 25 ug via INTRAVENOUS
  Administered 2021-08-02 (×2): 12.5 ug via INTRAVENOUS
  Administered 2021-08-02: 25 ug via INTRAVENOUS
  Administered 2021-08-02 (×3): 12.5 ug via INTRAVENOUS
  Administered 2021-08-02: 25 ug via INTRAVENOUS

## 2021-08-02 MED ORDER — SODIUM CHLORIDE 0.9% FLUSH: 3.0000 mL | Freq: Two times a day (BID) | INTRAVENOUS | Status: DC

## 2021-08-02 MED ORDER — SODIUM CHLORIDE 0.9 % IV SOLN: 250.0000 mL | INTRAVENOUS | Status: DC | PRN

## 2021-08-02 MED ORDER — BUPIVACAINE HCL (PF) 0.25 % IJ SOLN
INTRAMUSCULAR | Status: AC
  Filled 2021-08-02: qty 60

## 2021-08-02 SURGICAL SUPPLY — 11 items
BAG SNAP BAND KOVER 36X36 (MISCELLANEOUS) ×4 IMPLANT
CATH BLAZERPRIME XP (ABLATOR) ×2 IMPLANT
CATH DUODECA HALO/ISMUS 7FR (CATHETERS) ×2 IMPLANT
CATH JOSEPH QUAD ALLRED 6F REP (CATHETERS) ×2 IMPLANT
CATH POLARIS X 2.5/5/2.5 DECAP (CATHETERS) ×2 IMPLANT
MAT PREVALON FULL STRYKER (MISCELLANEOUS) ×2 IMPLANT
PACK EP LATEX FREE (CUSTOM PROCEDURE TRAY) ×3
PACK EP LF (CUSTOM PROCEDURE TRAY) ×1 IMPLANT
PAD DEFIB RADIO PHYSIO CONN (PAD) ×3 IMPLANT
SHEATH PINNACLE 6F 10CM (SHEATH) ×4 IMPLANT
SHEATH PINNACLE 8F 10CM (SHEATH) ×4 IMPLANT

## 2021-08-02 NOTE — Interval H&P Note (Signed)
History and Physical Interval Note:  08/02/2021 7:25 AM  [name]  has presented today for surgery, with the diagnosis of aflutter.  The various methods of treatment have been discussed with the patient and family. After consideration of risks, benefits and other options for treatment, the patient has consented to  Procedure(s): A-FLUTTER ABLATION (N/A) as a surgical intervention.  The patient's history has been reviewed, patient examined, no change in status, stable for surgery.  I have reviewed the patient's chart and labs.  Questions were answered to the patient's satisfaction.     Caleb [name]

## 2021-08-02 NOTE — Progress Notes (Addendum)
Client up and walked and tolerated well; groin stable after walking no bleeding or hematoma; client seen by Dr Ladona Ridgel and ok to d/c home

## 2021-08-02 NOTE — Progress Notes (Signed)
Site area: rt groin 3 fv sheaths Site Prior to Removal:  Level 0 Pressure Applied For: 15 minutes Manual:   yes Patient Status During Pull:  stable Post Pull Site:  Level 0 Post Pull Instructions Given:  yes Post Pull Pulses Present: rt dp palpable Dressing Applied:  gauze and tegaderm Bedrest begins @ 1055 Comments:

## 2021-08-03 ENCOUNTER — Encounter (HOSPITAL_COMMUNITY): Payer: Self-pay | Admitting: Internal Medicine

## 2021-08-05 ENCOUNTER — Telehealth: Payer: Self-pay | Admitting: Internal Medicine

## 2021-08-05 NOTE — Telephone Encounter (Signed)
Caleb [name] is calling requesting a work note releasing him to go back since his procedure. Please advise.

## 2021-08-06 ENCOUNTER — Encounter: Payer: Self-pay | Admitting: *Deleted

## 2021-08-11 NOTE — Telephone Encounter (Signed)
Letter created on 08/06/2021.  No further needs.  (Attempted to call Pt to confirm)

## 2021-08-16 ENCOUNTER — Emergency Department (HOSPITAL_COMMUNITY)
Admission: EM | Admit: 2021-08-16 | Discharge: 2021-08-16 | Disposition: A | Discharge status: Home / Self Care | Payer: 59 | Attending: Emergency Medicine | Admitting: Emergency Medicine

## 2021-08-16 ENCOUNTER — Emergency Department (HOSPITAL_COMMUNITY): Payer: 59

## 2021-08-16 ENCOUNTER — Encounter (HOSPITAL_COMMUNITY): Payer: Self-pay

## 2021-08-16 DIAGNOSIS — R000 Tachycardia, unspecified: Secondary | ICD-10-CM | POA: Diagnosis present

## 2021-08-16 DIAGNOSIS — I483 Typical atrial flutter: Secondary | ICD-10-CM

## 2021-08-16 DIAGNOSIS — I4892 Unspecified atrial flutter: Secondary | ICD-10-CM | POA: Diagnosis not present

## 2021-08-16 DIAGNOSIS — Z7901 Long term (current) use of anticoagulants: Secondary | ICD-10-CM | POA: Diagnosis not present

## 2021-08-16 LAB — COMPREHENSIVE METABOLIC PANEL
ALT: 28 U/L (ref 0–44)
AST: 37 U/L (ref 15–41)
Albumin: 3.6 g/dL (ref 3.5–5.0)
Alkaline Phosphatase: 94 U/L (ref 38–126)
Anion gap: 13 (ref 5–15)
BUN: 7 mg/dL — ABNORMAL LOW (ref 8–23)
CO2: 21 mmol/L — ABNORMAL LOW (ref 22–32)
Calcium: 8.8 mg/dL — ABNORMAL LOW (ref 8.9–10.3)
Chloride: 102 mmol/L (ref 98–111)
Creatinine, Ser: 1.09 mg/dL (ref 0.61–1.24)
GFR, Estimated: >60 mL/min (ref >60)
Glucose, Bld: 226 mg/dL — ABNORMAL HIGH (ref 70–99)
Potassium: 4.1 mmol/L (ref 3.5–5.1)
Sodium: 136 mmol/L (ref 135–145)
Total Bilirubin: 1 mg/dL (ref 0.3–1.2)
Total Protein: 7.4 g/dL (ref 6.5–8.1)

## 2021-08-16 LAB — CBC
HCT: 53.4 % — ABNORMAL HIGH (ref 39.0–52.0)
Hemoglobin: 17.8 g/dL — ABNORMAL HIGH (ref 13.0–17.0)
MCH: 29.6 pg (ref 26.0–34.0)
MCHC: 33.3 g/dL (ref 30.0–36.0)
MCV: 88.9 fL (ref 80.0–100.0)
Platelets: 189 10*3/uL (ref 150–400)
RBC: 6.01 MIL/uL — ABNORMAL HIGH (ref 4.22–5.81)
RDW: 14.3 % (ref 11.5–15.5)
WBC: 5.3 10*3/uL (ref 4.0–10.5)
nRBC: 0 % (ref 0.0–0.2)

## 2021-08-16 MED ORDER — FLECAINIDE ACETATE 100 MG PO TABS: 100.0000 mg | ORAL_TABLET | Freq: Two times a day (BID) | ORAL | 6 refills | Status: DC

## 2021-08-16 MED ORDER — PROPOFOL 10 MG/ML IV BOLUS
0.5000 mg/kg | Freq: Once | INTRAVENOUS | Status: AC
Start: 2021-08-16 — End: 2021-08-16
  Administered 2021-08-16: 59 mg via INTRAVENOUS
  Filled 2021-08-16: qty 20

## 2021-08-16 MED ORDER — FLECAINIDE ACETATE 100 MG PO TABS: 100.0000 mg | ORAL_TABLET | Freq: Two times a day (BID) | ORAL | Status: DC

## 2021-08-16 MED ORDER — METOPROLOL SUCCINATE ER 50 MG PO TB24: 50.0000 mg | ORAL_TABLET | Freq: Two times a day (BID) | ORAL | 6 refills | Status: DC

## 2021-08-16 MED ORDER — FLECAINIDE ACETATE 100 MG PO TABS
100.0000 mg | ORAL_TABLET | Freq: Two times a day (BID) | ORAL | Status: DC
  Administered 2021-08-16: 100 mg via ORAL
  Filled 2021-08-16 (×3): qty 1

## 2021-08-16 MED ORDER — METOPROLOL SUCCINATE ER 25 MG PO TB24: 50.0000 mg | ORAL_TABLET | Freq: Two times a day (BID) | ORAL | Status: DC

## 2021-08-16 NOTE — ED Notes (Signed)
Pt has been waiting for fecainide from pharmacy for approximately 90 minutes, delaying d/c

## 2021-08-16 NOTE — Discharge Instructions (Addendum)
It was our pleasure to provide your ER care today - we hope that you feel better.  Your cardiologist indicates to take flecainide as prescribed, and to increase the metoprolol dose to 50 mg 2x/day.  Follow up closely with your cardiologist in the next 1-2 weeks - call office to confirm appointment time.   Return to ER if worse, new symptoms, persistent fast heart beating, faint, chest pain, trouble breathing, or other concern.   You were given medication in the ER - no driving for the next 12 hours.

## 2021-08-16 NOTE — ED Notes (Signed)
Cards at bedside

## 2021-08-16 NOTE — ED Notes (Signed)
Please contact wife, Manuela Schwartz, after cardioversion and ready for d/c, [phone]

## 2021-08-16 NOTE — Progress Notes (Signed)
Rt was at bedside for cardioversion.

## 2021-08-16 NOTE — ED Triage Notes (Signed)
Pt BIBA from home. Pt had sudden onset of chest pressure radiating to shoulders , along with SHOB. Pt took 30 cardizem, as prescribed. Hx a flutter, ablation, cardioversion. Pt states it got worse after cardizem- pale, clammy, diaphoretic, weak radials.   Pt received 18 adenosine (6, 12), along 500 NS. After meds, relief of chest pressure

## 2021-08-16 NOTE — ED Provider Notes (Signed)
Caleb [name] [facility] EMERGENCY DEPARTMENT Provider Note   CSN: [id number] Arrival date & time:        History  Chief Complaint  Patient presents with   Tachycardia    Caleb [name] is a 61 y.o. male.  Patient with hx atrial flutter, c/o palpitations acute onset this AM, felt same as prior atrial flutter. Indicates problems with atrial flutter started in 06/2021 - states had ED cardioversion then and subsequently had ablation procedure with cardiology, but states was told 'area was very thick, and procedure not complete success, and that he may need another procedure.  Today, at rest, onset intermittent palpitations, and then at work became more constant/worse, and felt sob, chest pressure, faint.  EMS did totdal of 18 mg of adenosine and indicates hr improved from 250 to 130s.  Pt denies any exertional chest pain or discomfort, and denies any chest discomfort other than the pressure when heart rate was very fast w EMS. No chest pain or discomfort now.   The history is provided by the patient, medical records and the EMS personnel.      Home Medications Prior to Admission medications   Medication Sig Start Date End Date Taking? Authorizing Provider  acetaminophen (TYLENOL) 325 MG tablet Take 650 mg by mouth every 4 (four) hours as needed for headache.    [provider]  apixaban Everlene Balls) 5 MG TABS tablet Take 1 tablet by mouth twice daily 06/21/21   Marinus Maw, MD  atorvastatin (LIPITOR) 10 MG tablet Take 10 mg by mouth every evening. 06/13/21   [provider]  Blood Glucose Monitoring Suppl (TRUE METRIX AIR GLUCOSE METER) DEVI Check CBG BID 03/11/20   Hilts, Casimiro Needle, MD  diltiazem (CARDIZEM) 30 MG tablet Take 1 tablet (30 mg total) by mouth as needed. Patient taking differently: Take 30 mg by mouth daily as needed. 07/02/21   Graciella Freer, PA-C  metFORMIN (GLUCOPHAGE) 500 MG tablet Take 1 tablet (500 mg total) by mouth 2 (two) times daily  with a meal. Patient taking differently: Take 500-1,000 mg by mouth See admin instructions. Take 1000 mg in the morning and 500 mg in the evening 03/14/20   Hilts, Michael, MD  metoprolol succinate (TOPROL-XL) 50 MG 24 hr tablet TAKE 1 1/2 TABLET BY MOUTH ONCE DAILY(TAKE WITH OR IMMEDIATELY FOLLOWING A MEAL) 07/02/21   Graciella Freer, PA-C      Allergies    Patient has no known allergies.    Review of Systems   Review of Systems  Constitutional:  Negative for chills, diaphoresis and fever.  HENT:  Negative for sore throat.   Eyes:  Negative for redness.  Respiratory:  Negative for shortness of breath.   Cardiovascular:  Positive for palpitations. Negative for leg swelling.  Gastrointestinal:  Negative for abdominal pain, nausea and vomiting.  Genitourinary:  Negative for flank pain.  Musculoskeletal:  Negative for back pain and neck pain.  Skin:  Negative for rash.  Neurological:  Negative for syncope and headaches.  Hematological:  Does not bruise/bleed easily.  Psychiatric/Behavioral:  Negative for confusion.    Physical Exam Updated Vital Signs BP (!) 132/92 (BP Location: Left Arm)    Pulse (!) 130    Temp 98.2 F (36.8 C) (Oral)    Resp 18    Ht 1.854 m (6\' 1" )    Wt 117.9 kg    SpO2 98%    BMI 34.30 kg/m  Physical Exam Vitals and nursing note reviewed.  Constitutional:      Appearance: Normal appearance. He is well-developed.  HENT:     Head: Atraumatic.     Nose: Nose normal.     Mouth/Throat:     Mouth: Mucous membranes are moist.     Pharynx: Oropharynx is clear.  Eyes:     General: No scleral icterus.    Conjunctiva/sclera: Conjunctivae normal.     Pupils: Pupils are equal, round, and reactive to light.  Neck:     Trachea: No tracheal deviation.     Comments: Thyroid not grossly enlarged or tender.  Cardiovascular:     Rate and Rhythm: Regular rhythm. Tachycardia present.     Pulses: Normal pulses.     Heart sounds: Normal heart sounds. No murmur  heard.   No friction rub. No gallop.  Pulmonary:     Effort: Pulmonary effort is normal. No accessory muscle usage or respiratory distress.     Breath sounds: Normal breath sounds.  Abdominal:     General: Bowel sounds are normal. There is no distension.     Palpations: Abdomen is soft.     Tenderness: There is no abdominal tenderness.  Genitourinary:    Comments: No cva tenderness. Musculoskeletal:        General: No swelling or tenderness.     Cervical back: Normal range of motion and neck supple. No rigidity.     Right lower leg: No edema.     Left lower leg: No edema.  Skin:    General: Skin is warm and dry.     Findings: No rash.  Neurological:     Mental Status: He is alert.     Comments: Alert, speech clear.   Psychiatric:        Mood and Affect: Mood normal.    ED Results / Procedures / Treatments   Labs (all labs ordered are listed, but only abnormal results are displayed) Results for orders placed or performed during the hospital encounter of 08/02/21  Glucose, capillary  Result Value Ref Range   Glucose-Capillary 137 (H) 70 - 99 mg/dL  Glucose, capillary  Result Value Ref Range   Glucose-Capillary 116 (H) 70 - 99 mg/dL   EP STUDY  Addendum Date: 08/02/2021   Conclusion: Unsuccessful catheter ablation of the patient's atrial flutter despite extensive RF energy application delivered to the atrial flutter isthmus.  The atrial flutter isthmus was quite large and the atrial myocardium appeared to be quite thick as we had residual atrial signal despite multiple RF energy applications delivered at 60 W and 60 degrees.  The patient was successfully cardioverted back to sinus rhythm.  If he has recurrent atrial flutter then consideration for irrigated RF delivered with the assistance of anesthesia will be considered. Lewayne Bunting, MD  Result Date: 08/02/2021 Conclusion: Unsuccessful catheter ablation of the patient's atrial flutter despite extensive RF energy application  delivered to the atrial flutter isthmus.  The atrial flutter isthmus was quite large and the atrial myocardium appeared to be quite thick as we had residual atrial signal despite multiple RF energy applications delivered at 60 W and 60 degrees.  The patient was successfully cardioverted back to sinus rhythm.  If he has recurrent atrial flutter then consideration for irrigated RF delivered with the assistance of anesthesia will be considered. Lewayne Bunting, MD    EKG EKG Interpretation  Date/Time:  Monday August 16 2021 11:39:27 EST Ventricular Rate:  83 PR Interval:  146 QRS Duration: 96 QT Interval:  372 QTC Calculation:  438 R Axis:   73 Text Interpretation: Normal sinus rhythm Confirmed by Cathren Laine ([id number]) on 08/16/2021 11:44:15 AM  Radiology DG Chest Port 1 View  Result Date: 08/16/2021 CLINICAL DATA:  palpitations, sob EXAM: PORTABLE CHEST 1 VIEW COMPARISON:  Radiograph 03/03/2020 FINDINGS: Unchanged cardiomediastinal silhouette. There is no focal airspace consolidation. There is no right pleural effusion, the left costophrenic sulcus is excluded by collimation. There is no visible pneumothorax. Right medial basilar diaphragmatic. Eventration. There is no acute osseous abnormality. IMPRESSION: No evidence of acute cardiopulmonary disease. Electronically Signed   By: Caprice Renshaw M.D.   On: 08/16/2021 10:03    Procedures .Sedation  Date/Time: 08/16/2021 11:45 AM Performed by: Cathren Laine, MD Authorized by: Cathren Laine, MD   Consent:    Consent obtained:  Verbal and written   Consent given by:  Patient Universal protocol:    Immediately prior to procedure, a time out was called: yes     Patient identity confirmed:  Arm band and verbally with patient Indications:    Procedure performed:  Cardioversion   Procedure necessitating sedation performed by:  Physician performing sedation Pre-sedation assessment:    Time since last food or drink:  3.5 hours   ASA classification:  class 2 - patient with mild systemic disease     Mallampati score:  II - soft palate, uvula, fauces visible   Pre-sedation assessments completed and reviewed: airway patency, cardiovascular function, hydration status, mental status, nausea/vomiting, pain level, respiratory function and temperature     Pre-sedation assessment completed:  08/16/2021 11:30 AM Immediate pre-procedure details:    Reassessment: Patient reassessed immediately prior to procedure     Reviewed: vital signs, relevant labs/tests and NPO status     Verified: bag valve mask available, emergency equipment available, intubation equipment available, IV patency confirmed, oxygen available and suction available   Procedure details (see MAR for exact dosages):    Preoxygenation:  Nasal cannula   Sedation:  Propofol   Intra-procedure monitoring:  Blood pressure monitoring, cardiac monitor, continuous pulse oximetry, continuous capnometry, frequent LOC assessments and frequent vital sign checks   Intra-procedure events: none     Total Provider sedation time (minutes):  15 Post-procedure details:    Post-sedation assessment completed:  08/16/2021 11:48 AM   Attendance: Constant attendance by certified staff until patient recovered     Recovery: Patient returned to pre-procedure baseline     Post-sedation assessments completed and reviewed: airway patency, cardiovascular function, hydration status, mental status, nausea/vomiting, pain level, respiratory function and temperature     Patient is stable for discharge or admission: yes     Procedure completion:  Tolerated well, no immediate complications .Cardioversion  Date/Time: 08/16/2021 11:48 AM Performed by: Cathren Laine, MD Authorized by: Cathren Laine, MD   Consent:    Consent obtained:  Verbal and written   Consent given by:  Patient Pre-procedure details:    Rhythm:  Atrial flutter Patient sedated: Yes. Refer to sedation procedure documentation for details of  sedation.  Attempt one:    Cardioversion mode:  Synchronous   Shock (Joules):  100   Shock outcome:  Conversion to normal sinus rhythm Post-procedure details:    Patient status:  Alert   Patient tolerance of procedure:  Tolerated well, no immediate complications    Medications Ordered in ED Medications - No data to display  ED Course/ Medical Decision Making/ A&P  Medical Decision Making Amount and/or Complexity of Data Reviewed Labs: ordered. Radiology: ordered. ECG/medicine tests: ordered.  Iv ns. Continuous pulse ox and cardiac monitoring. Stat labs. Ecg. Pcxr.   Initial EMS ecg, hr 250.   Current cardiac monitor: a flutter, hr 136.   Reviewed nursing notes and prior charts for additional history. External reported reviewed - recent ablation procedure reviewed. Additional hx provided by EMS, discussed pt and their meds/tx.   Labs reviewed/interpreted by me - k normal.   CXR reviewed/interpreted by me - no pna.   Cardiology emergently consulted, discussed pt, ecg, will see in ED.   Cardiology requests ED cardioversion and d/c, they will arrange outpatient follow up with patient. Cardiology is also starting on flecainide and adjusting metoprolol dose.   Pt verifies last ate/biscuit at/around 8 AM, and has been compliant w eliquis, not missing any doses.   Post cardioversion, pt fully awake and alert, no chest pain or sob. Remains in nsr.   Flecainide dose per cardiology.  CRITICAL CARE RE: atrial flutter with rapid ventricular response/HR 250, ED procedural sedation and cardioversion, emergent cardiology consultation.  Performed by: Suzi RootsKevin E Aizik Reh Total critical care time: 40 minutes Critical care time was exclusive of separately billable procedures and treating other patients. Critical care was necessary to treat or prevent imminent or life-threatening deterioration. Critical care was time spent personally by me on the following activities:  development of treatment plan with patient and/or surrogate as well as nursing, discussions with consultants, evaluation of patient's response to treatment, examination of patient, obtaining history from patient or surrogate, ordering and performing treatments and interventions, ordering and review of laboratory studies, ordering and review of radiographic studies, pulse oximetry and re-evaluation of patient's condition.           Final Clinical Impression(s) / ED Diagnoses Final diagnoses:  None    Rx / DC Orders ED Discharge Orders     None         Cathren LaineSteinl, Melvia Matousek, MD 08/16/21 1153

## 2021-08-16 NOTE — Consult Note (Addendum)
ELECTROPHYSIOLOGY CONSULT NOTE    Patient ID: [name] MRN: [id number], DOB/AGE: [birth year] 61 y.o.  Admit date: 08/16/2021 Date of Consult: 08/16/2021  Primary Physician: Ferd Hibbs, NP Primary Cardiologist: Skeet Latch, MD  Electrophysiologist: Dr. Lovena Le  Referring Provider: Dr. Ashok Cordia  Patient Profile: Caleb [name] is a 61 y.o. male with a history of HTN atrial flutter s/p failed ablaiton 08/02/2021 who is being seen today for the evaluation of recurrent Atrial flutter with RVR at the request of Dr. Ashok Cordia.  HPI:  Caleb [name] is a 61 y.o. male with medical history as above well known to EP team.   USOH up until yesterday when he started to have palpitations and his HR was elevated. Took his prn diltiazem with relief.  Went to work this morning, and again had same sensation, this time with chest pain, SOB, and near syncope  EMS called and arrived to find pt in flutter at 250. Given 6 -> 12 of adenosine and by report slowed but did not convert. (EMS run sheet unavailable at time of consult).   Pertinent labs on admission include WBC 5.3, Hgb 17.8, Cr 1.09, K 4.1.  He has not missed and Eliquis. He did have a plain [employer] biscuit around 0830 pm. He feels the palpitations at rest current, but otherwise feels better with HR slowed. Asks about the possibility of re-do ablation as previously discussed  Past Medical History:  Diagnosis Date   Diabetes mellitus without complication ([place])    Type II   Dysrhythmia    aFlutter   Hypertension      Surgical History:  Past Surgical History:  Procedure Laterality Date   A-FLUTTER ABLATION N/A 08/02/2021   Procedure: A-FLUTTER ABLATION;  Surgeon: Evans Lance, MD;  Location: [place] [facility];  Service: Cardiovascular;  Laterality: N/A;   KNEE SURGERY     ROTATOR CUFF REPAIR Right      (Not in a hospital admission)   Inpatient Medications:   Allergies: No Known Allergies  Social History    Socioeconomic History   Marital status: Legally Separated    Spouse name: Not on file   Number of children: Not on file   Years of education: Not on file   Highest education level: Not on file  Occupational History   Not on file  Tobacco Use   Smoking status: Former    Years: 20.00    Types: Cigarettes    Quit date: 11/2008    Years since quitting: 12.7   Smokeless tobacco: Never  Vaping Use   Vaping Use: Never used  Substance and Sexual Activity   Alcohol use: Not Currently   Drug use: Never   Sexual activity: Not on file  Other Topics Concern   Not on file  Social History Narrative   Not on file   Social Determinants of Health   Financial Resource Strain: Not on file  Food Insecurity: Not on file  Transportation Needs: Not on file  Physical Activity: Not on file  Stress: Not on file  Social Connections: Not on file  Intimate Partner Violence: Not on file     Family History  Problem Relation Age of Onset   Cancer Father    Lung cancer Father    Healthy Sister    Diabetes Neg Hx    Heart attack Neg Hx    Gout Neg Hx      Review of Systems: All other systems reviewed and are otherwise negative except  as noted above.  Physical Exam: Vitals:   08/16/21 0941 08/16/21 0945 08/16/21 1000 08/16/21 1015  BP:  (!) 132/92 104/79 106/77  Pulse:  (!) 130 (!) 133 (!) 136  Resp:  18 (!) 8 10  Temp:  98.2 F (36.8 C)    TempSrc:  Oral    SpO2:  98% 97% 96%  Weight: 117.9 kg     Height: 6\' 1"  (1.854 m)       GEN- The patient is well appearing, alert and oriented x 3 today.   HEENT: normocephalic, atraumatic; sclera clear, conjunctiva pink; hearing intact; oropharynx clear; neck supple Lungs- Clear to ausculation bilaterally, normal work of breathing.  No wheezes, rales, rhonchi Heart- Regular rate and rhythm, no murmurs, rubs or gallops GI- soft, non-tender, non-distended, bowel sounds present Extremities- no clubbing, cyanosis, or edema; DP/PT/radial pulses  2+ bilaterally MS- no significant deformity or atrophy Skin- warm and dry, no rash or lesion Psych- euthymic mood, full affect Neuro- strength and sensation are intact  Labs:   Lab Results  Component Value Date   WBC 5.3 08/16/2021   HGB 17.8 (H) 08/16/2021   HCT 53.4 (H) 08/16/2021   MCV 88.9 08/16/2021   PLT 189 08/16/2021   No results for input(s): NA, K, CL, CO2, BUN, CREATININE, CALCIUM, PROT, BILITOT, ALKPHOS, ALT, AST, GLUCOSE in the last 168 hours.  Invalid input(s): LABALBU    Radiology/Studies: EP STUDY  Addendum Date: 08/02/2021   Conclusion: Unsuccessful catheter ablation of the patient's atrial flutter despite extensive RF energy application delivered to the atrial flutter isthmus.  The atrial flutter isthmus was quite large and the atrial myocardium appeared to be quite thick as we had residual atrial signal despite multiple RF energy applications delivered at 60 W and 60 degrees.  The patient was successfully cardioverted back to sinus rhythm.  If he has recurrent atrial flutter then consideration for irrigated RF delivered with the assistance of anesthesia will be considered. Cristopher Peru, MD  Result Date: 08/02/2021 Conclusion: Unsuccessful catheter ablation of the patient's atrial flutter despite extensive RF energy application delivered to the atrial flutter isthmus.  The atrial flutter isthmus was quite large and the atrial myocardium appeared to be quite thick as we had residual atrial signal despite multiple RF energy applications delivered at 60 W and 60 degrees.  The patient was successfully cardioverted back to sinus rhythm.  If he has recurrent atrial flutter then consideration for irrigated RF delivered with the assistance of anesthesia will be considered. Cristopher Peru, MD   DG Chest Port 1 View  Result Date: 08/16/2021 CLINICAL DATA:  palpitations, sob EXAM: PORTABLE CHEST 1 VIEW COMPARISON:  Radiograph 03/03/2020 FINDINGS: Unchanged cardiomediastinal  silhouette. There is no focal airspace consolidation. There is no right pleural effusion, the left costophrenic sulcus is excluded by collimation. There is no visible pneumothorax. Right medial basilar diaphragmatic. Eventration. There is no acute osseous abnormality. IMPRESSION: No evidence of acute cardiopulmonary disease. Electronically Signed   By: Maurine Simmering M.D.   On: 08/16/2021 10:03    EKG: on arrival shows likely 2:1 Atrial flutter in 130s (personally reviewed) EMS EKG shows likely 1:1 flutter ~250-260 bpm  TELEMETRY: Atrial flutter 130s (personally reviewed)  Assessment/Plan: 1.  Typical atrial flutter Failed ablation 08/02/21 Continue eliquis, has not missed doses. Change toprol to 50 mg BID Will start flecainide 100 mg BID AFTER cardioversion. No h/o CAD noted and pt denies history. Consider updating Echo once in NSR.  Will move appointment to discuss  re-do ablation with Dr. Lovena Le up.   Dr. Quentin Ore has seen and plan per above.   Will need EKG in 7-10 days with starting flecainide.   For questions or updates, please contact [place] Please consult [email or web address] for contact info under Cardiology/STEMI.  Jacalyn Lefevre, PA-C  08/16/2021 10:23 AM

## 2021-08-16 NOTE — Sedation Documentation (Signed)
Pt cardioverted. 100 j by Denton Lank

## 2021-08-25 ENCOUNTER — Ambulatory Visit (INDEPENDENT_AMBULATORY_CARE_PROVIDER_SITE_OTHER): Payer: 59

## 2021-08-25 ENCOUNTER — Other Ambulatory Visit: Payer: Self-pay

## 2021-08-25 VITALS — HR 72 | Ht 73.0 in

## 2021-08-25 DIAGNOSIS — I483 Typical atrial flutter: Secondary | ICD-10-CM | POA: Diagnosis not present

## 2021-08-25 NOTE — Patient Instructions (Signed)
Follow up visit scheduled

## 2021-08-25 NOTE — Progress Notes (Signed)
Reason for visit: new start flecainide  Name of MD requesting visit: Dr. Ladona Ridgel  H&P: Pt with recent hospitalization with aflutter with RVR.  Start flecainide 100 mg PO BID  ROS related to problem: Pt states he has been tolerating the flecainide well.  States he has had no episodes since starting the flecainide  Assessment and plan per MD: EKG sent to Dr. Ladona Ridgel for review.  Pt has follow up 08/31/21 to discuss repeat ablation

## 2021-08-31 ENCOUNTER — Other Ambulatory Visit: Payer: Self-pay

## 2021-08-31 ENCOUNTER — Encounter: Payer: Self-pay | Admitting: Internal Medicine

## 2021-08-31 ENCOUNTER — Ambulatory Visit (INDEPENDENT_AMBULATORY_CARE_PROVIDER_SITE_OTHER): Payer: 59 | Admitting: Internal Medicine

## 2021-08-31 VITALS — BP 130/66 | HR 78 | Ht 73.0 in | Wt 254.0 lb

## 2021-08-31 DIAGNOSIS — I483 Typical atrial flutter: Secondary | ICD-10-CM

## 2021-08-31 DIAGNOSIS — I10 Essential (primary) hypertension: Secondary | ICD-10-CM

## 2021-08-31 NOTE — Progress Notes (Signed)
HPI Caleb [name] returns today for followup. He is a pleasant 61 yo man with a h/o dyslipidemia and HTN and atrial flutter who underwent EP study and ablation a month ago. He had a very large and thick TV isthmus and catheter ablation was unsuccessful as he had exceeded the XRT allowances and the procedure was stopped before isthmus block could be obtained. He represented 2 weeks ago with recurrent palpitations and was found to be back in atrial flutter. He was cardioverted and placed back on flecainide and returns for additional eval. He denies chest pain or sob.  No Known Allergies   Current Outpatient Medications  Medication Sig Dispense Refill   acetaminophen (TYLENOL) 325 MG tablet Take 650 mg by mouth every 4 (four) hours as needed for headache.     apixaban (ELIQUIS) 5 MG TABS tablet Take 1 tablet by mouth twice daily (Patient taking differently: Take 5 mg by mouth 2 (two) times daily.) 60 tablet 5   atorvastatin (LIPITOR) 10 MG tablet Take 10 mg by mouth every evening.     Blood Glucose Monitoring Suppl (TRUE METRIX AIR GLUCOSE METER) DEVI Check CBG BID 1 each 11   colchicine 0.6 MG tablet Take 1 tablet by mouth as needed.     diltiazem (CARDIZEM) 30 MG tablet Take 1 tablet (30 mg total) by mouth as needed. (Patient taking differently: Take 30 mg by mouth daily as needed.) 90 tablet 3   flecainide (TAMBOCOR) 100 MG tablet Take 1 tablet (100 mg total) by mouth every 12 (twelve) hours. 60 tablet 6   metFORMIN (GLUCOPHAGE) 500 MG tablet Take 1 tablet (500 mg total) by mouth 2 (two) times daily with a meal. (Patient taking differently: Take 500-1,000 mg by mouth See admin instructions. Take 1000 mg in the morning and 500 mg in the evening) 60 tablet 11   metoprolol succinate (TOPROL-XL) 50 MG 24 hr tablet Take 1 tablet (50 mg total) by mouth 2 (two) times daily. 60 tablet 6   predniSONE (DELTASONE) 20 MG tablet Take 1 tablet by mouth as needed.     No current facility-administered  medications for this visit.     Past Medical History:  Diagnosis Date   Diabetes mellitus without complication (HCC)    Type II   Dysrhythmia    aFlutter   Hypertension     ROS:   All systems reviewed and negative except as noted in the HPI.   Past Surgical History:  Procedure Laterality Date   A-FLUTTER ABLATION N/A 08/02/2021   Procedure: A-FLUTTER ABLATION;  Surgeon: Marinus Maw, MD;  Location: [facility] INVASIVE CV LAB;  Service: Cardiovascular;  Laterality: N/A;   KNEE SURGERY     ROTATOR CUFF REPAIR Right      Family History  Problem Relation Age of Onset   Cancer Father    Lung cancer Father    Healthy Sister    Diabetes Neg Hx    Heart attack Neg Hx    Gout Neg Hx      Social History   Socioeconomic History   Marital status: Legally Separated    Spouse name: Not on file   Number of children: Not on file   Years of education: Not on file   Highest education level: Not on file  Occupational History   Not on file  Tobacco Use   Smoking status: Former    Years: 20.00    Types: Cigarettes    Quit date: 11/2008  Years since quitting: 12.8   Smokeless tobacco: Never  Vaping Use   Vaping Use: Never used  Substance and Sexual Activity   Alcohol use: Not Currently   Drug use: Never   Sexual activity: Not on file  Other Topics Concern   Not on file  Social History Narrative   Not on file   Social Determinants of Health   Financial Resource Strain: Not on file  Food Insecurity: Not on file  Transportation Needs: Not on file  Physical Activity: Not on file  Stress: Not on file  Social Connections: Not on file  Intimate Partner Violence: Not on file     BP 130/66    Pulse 78    Ht 6\' 1"  (1.854 m)    Wt 254 lb (115.2 kg)    SpO2 98%    BMI 33.51 kg/m   Physical Exam:  Well appearing NAD HEENT: Unremarkable Neck:  No JVD, no thyromegally Lymphatics:  No adenopathy Back:  No CVA tenderness Lungs:  Clear with no wheezes HEART:  Regular rate  rhythm, no murmurs, no rubs, no clicks Abd:  soft, positive bowel sounds, no organomegally, no rebound, no guarding Ext:  2 plus pulses, no edema, no cyanosis, no clubbing Skin:  No rashes no nodules Neuro:  CN II through XII intact, motor grossly intact  EKG - nsr  Assess/Plan:  Recurrent atrial flutter - He has a very large and thick atrial flutter isthmus and I have discussed the indications/risks/benefits/goals/expectations of EP study and ablation and he would like to proceed. We will use carto and anesthesia. He will hold flecainide 2 days before the procedure. 2. HTN  - his bp is controlled.  3. Coags - I asked him to hold the eliquis on the day of the procedure.   Atzin Buchta,MD

## 2021-08-31 NOTE — Patient Instructions (Addendum)
Medication Instructions:  Your physician recommends that you continue on your current medications as directed. Please refer to the Current Medication list given to you today.  Labwork: None ordered.  Testing/Procedures: None ordered.  Follow-Up:  SEE INSTRUCTION LETTER  Any Other Special Instructions Will Be Listed Below (If Applicable).  If you need a refill on your cardiac medications before your next appointment, please call your pharmacy.    

## 2021-09-09 ENCOUNTER — Other Ambulatory Visit: Payer: Self-pay

## 2021-09-09 ENCOUNTER — Ambulatory Visit: Payer: 59 | Admitting: Internal Medicine

## 2021-09-09 ENCOUNTER — Other Ambulatory Visit: Payer: 59

## 2021-09-09 DIAGNOSIS — I10 Essential (primary) hypertension: Secondary | ICD-10-CM

## 2021-09-09 DIAGNOSIS — I483 Typical atrial flutter: Secondary | ICD-10-CM

## 2021-09-10 LAB — BASIC METABOLIC PANEL
BUN/Creatinine Ratio: 12 (ref 10–24)
BUN: 11 mg/dL (ref 8–27)
CO2: 22 mmol/L (ref 20–29)
Calcium: 9.5 mg/dL (ref 8.6–10.2)
Chloride: 102 mmol/L (ref 96–106)
Creatinine, Ser: 0.93 mg/dL (ref 0.76–1.27)
Glucose: 99 mg/dL (ref 70–99)
Potassium: 4.8 mmol/L (ref 3.5–5.2)
Sodium: 142 mmol/L (ref 134–144)
eGFR: 93 mL/min/{1.73_m2} (ref >59)

## 2021-09-10 LAB — CBC WITH DIFFERENTIAL/PLATELET
Basophils Absolute: 0.1 10*3/uL (ref 0.0–0.2)
Basos: 1 %
EOS (ABSOLUTE): 0.1 10*3/uL (ref 0.0–0.4)
Eos: 2 %
Hematocrit: 48 % (ref 37.5–51.0)
Hemoglobin: 15.9 g/dL (ref 13.0–17.7)
Immature Grans (Abs): 0 10*3/uL (ref 0.0–0.1)
Immature Granulocytes: 0 %
Lymphocytes Absolute: 1.4 10*3/uL (ref 0.7–3.1)
Lymphs: 27 %
MCH: 29.4 pg (ref 26.6–33.0)
MCHC: 33.1 g/dL (ref 31.5–35.7)
MCV: 89 fL (ref 79–97)
Monocytes Absolute: 0.5 10*3/uL (ref 0.1–0.9)
Monocytes: 9 %
Neutrophils Absolute: 3.2 10*3/uL (ref 1.4–7.0)
Neutrophils: 61 %
Platelets: 170 10*3/uL (ref 150–450)
RBC: 5.4 x10E6/uL (ref 4.14–5.80)
RDW: 14.8 % (ref 11.6–15.4)
WBC: 5.2 10*3/uL (ref 3.4–10.8)

## 2021-09-29 ENCOUNTER — Other Ambulatory Visit: Payer: Self-pay

## 2021-09-29 ENCOUNTER — Ambulatory Visit (HOSPITAL_BASED_OUTPATIENT_CLINIC_OR_DEPARTMENT_OTHER): Payer: 59 | Admitting: Anesthesiology

## 2021-09-29 ENCOUNTER — Ambulatory Visit (HOSPITAL_COMMUNITY): Payer: 59 | Admitting: Anesthesiology

## 2021-09-29 ENCOUNTER — Ambulatory Visit (HOSPITAL_COMMUNITY)
Admission: RE | Admit: 2021-09-29 | Discharge: 2021-09-29 | Disposition: A | Discharge status: Home / Self Care | Payer: 59 | Attending: Internal Medicine | Admitting: Internal Medicine

## 2021-09-29 ENCOUNTER — Ambulatory Visit (HOSPITAL_COMMUNITY)
Admission: RE | Disposition: A | Discharge status: Home / Self Care | Payer: 59 | Source: Home / Self Care | Attending: Internal Medicine

## 2021-09-29 DIAGNOSIS — Z87891 Personal history of nicotine dependence: Secondary | ICD-10-CM | POA: Insufficient documentation

## 2021-09-29 DIAGNOSIS — I4892 Unspecified atrial flutter: Secondary | ICD-10-CM

## 2021-09-29 DIAGNOSIS — I483 Typical atrial flutter: Secondary | ICD-10-CM | POA: Diagnosis present

## 2021-09-29 DIAGNOSIS — Z7901 Long term (current) use of anticoagulants: Secondary | ICD-10-CM | POA: Insufficient documentation

## 2021-09-29 DIAGNOSIS — Z7984 Long term (current) use of oral hypoglycemic drugs: Secondary | ICD-10-CM | POA: Insufficient documentation

## 2021-09-29 DIAGNOSIS — E119 Type 2 diabetes mellitus without complications: Secondary | ICD-10-CM | POA: Insufficient documentation

## 2021-09-29 DIAGNOSIS — I10 Essential (primary) hypertension: Secondary | ICD-10-CM | POA: Diagnosis not present

## 2021-09-29 DIAGNOSIS — E785 Hyperlipidemia, unspecified: Secondary | ICD-10-CM | POA: Insufficient documentation

## 2021-09-29 DIAGNOSIS — Z79899 Other long term (current) drug therapy: Secondary | ICD-10-CM | POA: Insufficient documentation

## 2021-09-29 HISTORY — PX: A-FLUTTER ABLATION: EP1230

## 2021-09-29 LAB — GLUCOSE, CAPILLARY
Glucose-Capillary: 138 mg/dL — ABNORMAL HIGH (ref 70–99)
Glucose-Capillary: 178 mg/dL — ABNORMAL HIGH (ref 70–99)

## 2021-09-29 SURGERY — A-FLUTTER ABLATION
Anesthesia: General

## 2021-09-29 MED ORDER — MIDAZOLAM HCL 5 MG/5ML IJ SOLN
INTRAMUSCULAR | Status: DC | PRN
  Administered 2021-09-29: 2 mg via INTRAVENOUS

## 2021-09-29 MED ORDER — HEPARIN SODIUM (PORCINE) 1000 UNIT/ML IJ SOLN
INTRAMUSCULAR | Status: DC | PRN
  Administered 2021-09-29: 1000 [IU] via INTRAVENOUS

## 2021-09-29 MED ORDER — PHENYLEPHRINE 40 MCG/ML (10ML) SYRINGE FOR IV PUSH (FOR BLOOD PRESSURE SUPPORT)
PREFILLED_SYRINGE | INTRAVENOUS | Status: DC | PRN
  Administered 2021-09-29: 120 ug via INTRAVENOUS
  Administered 2021-09-29: 80 ug via INTRAVENOUS

## 2021-09-29 MED ORDER — FENTANYL CITRATE (PF) 250 MCG/5ML IJ SOLN
INTRAMUSCULAR | Status: DC | PRN
  Administered 2021-09-29: 50 ug via INTRAVENOUS
  Administered 2021-09-29: 25 ug via INTRAVENOUS

## 2021-09-29 MED ORDER — SODIUM CHLORIDE 0.9% FLUSH: 3.0000 mL | Freq: Two times a day (BID) | INTRAVENOUS | Status: DC

## 2021-09-29 MED ORDER — PROPOFOL 10 MG/ML IV BOLUS
INTRAVENOUS | Status: DC | PRN
  Administered 2021-09-29: 200 mg via INTRAVENOUS

## 2021-09-29 MED ORDER — ONDANSETRON HCL 4 MG/2ML IJ SOLN
INTRAMUSCULAR | Status: DC | PRN
  Administered 2021-09-29: 4 mg via INTRAVENOUS

## 2021-09-29 MED ORDER — ONDANSETRON HCL 4 MG/2ML IJ SOLN: 4.0000 mg | Freq: Four times a day (QID) | INTRAMUSCULAR | Status: DC | PRN

## 2021-09-29 MED ORDER — LIDOCAINE 2% (20 MG/ML) 5 ML SYRINGE
INTRAMUSCULAR | Status: DC | PRN
  Administered 2021-09-29: 100 mg via INTRAVENOUS

## 2021-09-29 MED ORDER — HEPARIN (PORCINE) IN NACL 1000-0.9 UT/500ML-% IV SOLN
INTRAVENOUS | Status: DC | PRN
  Administered 2021-09-29: 500 mL

## 2021-09-29 MED ORDER — PHENYLEPHRINE HCL-NACL 20-0.9 MG/250ML-% IV SOLN
INTRAVENOUS | Status: DC | PRN
  Administered 2021-09-29: 20 ug/min via INTRAVENOUS

## 2021-09-29 MED ORDER — ROCURONIUM BROMIDE 10 MG/ML (PF) SYRINGE
PREFILLED_SYRINGE | INTRAVENOUS | Status: DC | PRN
  Administered 2021-09-29: 80 mg via INTRAVENOUS

## 2021-09-29 MED ORDER — HEPARIN SODIUM (PORCINE) 1000 UNIT/ML IJ SOLN
INTRAMUSCULAR | Status: AC
  Filled 2021-09-29: qty 10

## 2021-09-29 MED ORDER — ACETAMINOPHEN 325 MG PO TABS
650.0000 mg | ORAL_TABLET | ORAL | Status: DC | PRN
  Filled 2021-09-29: qty 2

## 2021-09-29 MED ORDER — SODIUM CHLORIDE 0.9 % IV SOLN: 250.0000 mL | INTRAVENOUS | Status: DC | PRN

## 2021-09-29 MED ORDER — FENTANYL CITRATE (PF) 100 MCG/2ML IJ SOLN
INTRAMUSCULAR | Status: AC
  Filled 2021-09-29: qty 2

## 2021-09-29 MED ORDER — SODIUM CHLORIDE 0.9% FLUSH: 3.0000 mL | INTRAVENOUS | Status: DC | PRN

## 2021-09-29 MED ORDER — MIDAZOLAM HCL 5 MG/5ML IJ SOLN
INTRAMUSCULAR | Status: AC
  Filled 2021-09-29: qty 5

## 2021-09-29 MED ORDER — SODIUM CHLORIDE 0.9 % IV SOLN: INTRAVENOUS | Status: DC

## 2021-09-29 MED ORDER — SUGAMMADEX SODIUM 200 MG/2ML IV SOLN
INTRAVENOUS | Status: DC | PRN
  Administered 2021-09-29: 200 mg via INTRAVENOUS

## 2021-09-29 MED ORDER — DEXAMETHASONE SODIUM PHOSPHATE 10 MG/ML IJ SOLN
INTRAMUSCULAR | Status: DC | PRN
  Administered 2021-09-29: 5 mg via INTRAVENOUS

## 2021-09-29 SURGICAL SUPPLY — 12 items
BAG SNAP BAND KOVER 36X36 (MISCELLANEOUS) ×1 IMPLANT
CATH JOSEPH QUAD ALLRED 6F REP (CATHETERS) ×1 IMPLANT
CATH SMTCH THERMOCOOL SF FJ (CATHETERS) ×1 IMPLANT
CATH WEB BI DIR CSDF CRV REPRO (CATHETERS) ×1 IMPLANT
PACK EP LATEX FREE (CUSTOM PROCEDURE TRAY) ×2
PACK EP LF (CUSTOM PROCEDURE TRAY) ×1 IMPLANT
PAD DEFIB RADIO PHYSIO CONN (PAD) ×2 IMPLANT
PATCH CARTO3 (PAD) ×1 IMPLANT
SHEATH PINNACLE 6F 10CM (SHEATH) ×1 IMPLANT
SHEATH PINNACLE 7F 10CM (SHEATH) ×1 IMPLANT
SHEATH PINNACLE 8F 10CM (SHEATH) ×1 IMPLANT
TUBING SMART ABLATE COOLFLOW (TUBING) ×1 IMPLANT

## 2021-09-29 NOTE — Anesthesia Preprocedure Evaluation (Signed)
Anesthesia Evaluation  ?Patient identified by MRN, date of birth, ID band ?Patient awake ? ? ? ?Reviewed: ?Allergy & Precautions, NPO status , Patient's Chart, lab work & pertinent test results ? ?Airway ?Mallampati: II ? ?TM Distance: >3 FB ?Neck ROM: Full ? ? ? Dental ?no notable dental hx. ? ?  ?Pulmonary ?neg pulmonary ROS, former smoker,  ?  ?Pulmonary exam normal ?breath sounds clear to auscultation ? ? ? ? ? ? Cardiovascular ?hypertension, Normal cardiovascular exam+ dysrhythmias Atrial Fibrillation  ?Rhythm:Regular Rate:Normal ? ? ?  ?Neuro/Psych ?negative neurological ROS ? negative psych ROS  ? GI/Hepatic ?negative GI ROS, Neg liver ROS,   ?Endo/Other  ?diabetes ? Renal/GU ?negative Renal ROS  ?negative genitourinary ?  ?Musculoskeletal ?negative musculoskeletal ROS ?(+)  ? Abdominal ?  ?Peds ?negative pediatric ROS ?(+)  Hematology ?negative hematology ROS ?(+)   ?Anesthesia Other Findings ? ? Reproductive/Obstetrics ?negative OB ROS ? ?  ? ? ? ? ? ? ? ? ? ? ? ? ? ?  ?  ? ? ? ? ? ? ? ? ?Anesthesia Physical ?Anesthesia Plan ? ?ASA: 3 ? ?Anesthesia Plan: General  ? ?Post-op Pain Management: Minimal or no pain anticipated  ? ?Induction: Intravenous ? ?PONV Risk Score and Plan: 2 and Ondansetron, Dexamethasone and Treatment may vary due to age or medical condition ? ?Airway Management Planned: Oral ETT ? ?Additional Equipment:  ? ?Intra-op Plan:  ? ?Post-operative Plan: Extubation in OR ? ?Informed Consent: I have reviewed the patients History and Physical, chart, labs and discussed the procedure including the risks, benefits and alternatives for the proposed anesthesia with the patient or authorized representative who has indicated his/her understanding and acceptance.  ? ? ? ?Dental advisory given ? ?Plan Discussed with: CRNA and Surgeon ? ?Anesthesia Plan Comments:   ? ? ? ? ? ? ?Anesthesia Quick Evaluation ? ?

## 2021-09-29 NOTE — Anesthesia Procedure Notes (Signed)
Procedure Name: Intubation ?Date/Time: 09/29/2021 8:13 AM ?Performed by: Vonna Drafts, CRNA ?Pre-anesthesia Checklist: Patient identified, Emergency Drugs available, Suction available and Patient being monitored ?Patient Re-evaluated:Patient Re-evaluated prior to induction ?Oxygen Delivery Method: Circle system utilized ?Preoxygenation: Pre-oxygenation with 100% oxygen ?Induction Type: IV induction ?Ventilation: Oral airway inserted - appropriate to patient size and Two handed mask ventilation required ?Laryngoscope Size: Mac and 4 ?Grade View: Grade I ?Tube type: Oral ?Tube size: 7.5 mm ?Number of attempts: 1 ?Airway Equipment and Method: Stylet and Oral airway ?Placement Confirmation: ETT inserted through vocal cords under direct vision, positive ETCO2 and breath sounds checked- equal and bilateral ?Secured at: 23 cm ?Tube secured with: Tape ?Dental Injury: Teeth and Oropharynx as per pre-operative assessment  ? ? ? ? ?

## 2021-09-29 NOTE — Transfer of Care (Signed)
Immediate Anesthesia Transfer of Care Note ? ?Patient: Caleb [name] ? ?Procedure(s) Performed: A-FLUTTER ABLATION ? ?Patient Location: PACU and Cath Lab ? ?Anesthesia Type:General ? ?Level of Consciousness: awake ? ?Airway & Oxygen Therapy: Patient Spontanous Breathing and Patient connected to nasal cannula oxygen ? ?Post-op Assessment: Report given to RN and Post -op Vital signs reviewed and stable ? ?Post vital signs: Reviewed and stable ? ?Last Vitals:  ?Vitals Value Taken Time  ?BP 138/70 09/29/21 1001  ?Temp    ?Pulse 86 09/29/21 1002  ?Resp 17 09/29/21 1002  ?SpO2 99 % 09/29/21 1002  ?Vitals shown include unvalidated device data. ? ?Last Pain:  ?Vitals:  ? 09/29/21 0729  ?TempSrc:   ?PainSc: 0-No pain  ?   ? ?  ? ?Complications: No notable events documented. ?

## 2021-09-29 NOTE — Progress Notes (Signed)
SITE AREA: right groin/femoral ? ?SITE PRIOR TO REMOVAL:  LEVEL 0 ? ?PRESSURE APPLIED FOR: approximately 10 minutes ? ?MANUAL: yes ? ?PATIENT STATUS DURING PULL: stable ? ?POST PULL SITE:  LEVEL 0 ? ?POST PULL INSTRUCTIONS GIVEN: yes ? ?POST PULL PULSES PRESENT: bilateral pedal pulses at +2 ? ?DRESSING APPLIED: gauze with tegaderm ? ?BEDREST BEGINS @ 1044 ? ?COMMENTS:  sheath x 3 removed ?

## 2021-09-29 NOTE — Discharge Instructions (Signed)

## 2021-09-29 NOTE — Interval H&P Note (Signed)
History and Physical Interval Note: ? ?09/29/2021 ?7:49 AM ? ?Caleb [name]  has presented today for surgery, with the diagnosis of atrial flutter.  The various methods of treatment have been discussed with the patient and family. After consideration of risks, benefits and other options for treatment, the patient has consented to  Procedure(s): ?A-FLUTTER ABLATION (N/A) as a surgical intervention.  The patient's history has been reviewed, patient examined, no change in status, stable for surgery.  I have reviewed the patient's chart and labs.  Questions were answered to the patient's satisfaction.   ? ? ?Lewayne Bunting ? ? ?

## 2021-09-30 ENCOUNTER — Encounter (HOSPITAL_COMMUNITY): Payer: Self-pay | Admitting: Internal Medicine

## 2021-09-30 MED FILL — Fentanyl Citrate Preservative Free (PF) Inj 100 MCG/2ML: INTRAMUSCULAR | Qty: 1.5 | Status: AC

## 2021-09-30 NOTE — Anesthesia Postprocedure Evaluation (Signed)
Anesthesia Post Note ? ?Patient: Caleb [name] ? ?Procedure(s) Performed: A-FLUTTER ABLATION ? ?  ? ?Patient location during evaluation: PACU ?Anesthesia Type: General ?Level of consciousness: awake and alert ?Pain management: pain level controlled ?Vital Signs Assessment: post-procedure vital signs reviewed and stable ?Respiratory status: spontaneous breathing, nonlabored ventilation, respiratory function stable and patient connected to nasal cannula oxygen ?Cardiovascular status: blood pressure returned to baseline and stable ?Postop Assessment: no apparent nausea or vomiting ?Anesthetic complications: no ? ? ?There were no known notable events for this encounter. ? ?Last Vitals:  ?Vitals:  ? 09/29/21 1500 09/29/21 1600  ?BP: (!) 160/65 140/64  ?Pulse: (!) 102 (!) 101  ?Resp: 14 17  ?Temp:    ?SpO2: 97% 97%  ?  ?Last Pain:  ?Vitals:  ? 09/29/21 1028  ?TempSrc: Temporal  ?PainSc: 0-No pain  ? ? ?  ?  ?  ?  ?  ?  ? ?Brooklynne Pereida S ? ? ? ? ?

## 2021-11-02 ENCOUNTER — Encounter: Payer: Self-pay | Admitting: Internal Medicine

## 2021-11-02 ENCOUNTER — Ambulatory Visit (INDEPENDENT_AMBULATORY_CARE_PROVIDER_SITE_OTHER): Payer: 59 | Admitting: Internal Medicine

## 2021-11-02 VITALS — BP 140/78 | HR 73 | Ht 73.0 in | Wt 264.8 lb

## 2021-11-02 DIAGNOSIS — I483 Typical atrial flutter: Secondary | ICD-10-CM | POA: Diagnosis not present

## 2021-11-02 DIAGNOSIS — I10 Essential (primary) hypertension: Secondary | ICD-10-CM

## 2021-11-02 NOTE — Patient Instructions (Addendum)
Medication Instructions:  ?Your physician has recommended you make the following change in your medication:  ? ? STOP Eliquis ? ?Labwork: ?None ordered. ? ?Testing/Procedures: ?None ordered. ? ?Follow-Up: ?Your physician wants you to follow-up in: one year with Lewayne Bunting, MD with echocardiogram prior to appointment (At [facility]). ? ?Any Other Special Instructions Will Be Listed Below (If Applicable). ? ?If you need a refill on your cardiac medications before your next appointment, please call your pharmacy.  ? ?Important Information About Sugar ? ? ? ? ? ? ? ?

## 2021-11-02 NOTE — Progress Notes (Signed)
? ? ? ? ?HPI ?Caleb [name] returns today for followup. He is a pleasant 61 yo man with a h/o atrial flutter who underwent initial EP study and ablation and we never achieved isthmus block and had to stop due to high fluoroscopy time. The patient then developed recurrent flutter and has undergone repeat EP study and ablation with CARTO. He has maintained NSR with no symptoms of atrial flutter. He feels well. No palpitations.  ?No Known Allergies ? ? ?Current Outpatient Medications  ?Medication Sig Dispense Refill  ? atorvastatin (LIPITOR) 10 MG tablet Take 10 mg by mouth every evening.    ? Blood Glucose Monitoring Suppl (TRUE METRIX AIR GLUCOSE METER) DEVI Check CBG BID 1 each 11  ? diltiazem (CARDIZEM) 30 MG tablet Take 1 tablet (30 mg total) by mouth as needed. 90 tablet 3  ? flecainide (TAMBOCOR) 100 MG tablet Take 1 tablet (100 mg total) by mouth every 12 (twelve) hours. 60 tablet 6  ? metFORMIN (GLUCOPHAGE) 500 MG tablet Take 1 mg by mouth 3 (three) times daily. Take 2 tablets in the am and 1 tablet in the pm    ? metoprolol succinate (TOPROL-XL) 50 MG 24 hr tablet Take 1 tablet (50 mg total) by mouth 2 (two) times daily. 60 tablet 6  ? ?No current facility-administered medications for this visit.  ? ? ? ?Past Medical History:  ?Diagnosis Date  ? Diabetes mellitus without complication ([place])   ? Type II  ? Dysrhythmia   ? aFlutter  ? Hypertension   ? ? ?ROS: ? ? All systems reviewed and negative except as noted in the HPI. ? ? ?Past Surgical History:  ?Procedure Laterality Date  ? A-FLUTTER ABLATION N/A 08/02/2021  ? Procedure: A-FLUTTER ABLATION;  Surgeon: Evans Lance, MD;  Location: [place] CV LAB;  Service: Cardiovascular;  Laterality: N/A;  ? A-FLUTTER ABLATION N/A 09/29/2021  ? Procedure: A-FLUTTER ABLATION;  Surgeon: Evans Lance, MD;  Location: [place] [facility];  Service: Cardiovascular;  Laterality: N/A;  ? KNEE SURGERY    ? ROTATOR CUFF REPAIR Right   ? ? ? ?Family History  ?Problem Relation  Age of Onset  ? Cancer Father   ? Lung cancer Father   ? Healthy Sister   ? Diabetes Neg Hx   ? Heart attack Neg Hx   ? Gout Neg Hx   ? ? ? ?Social History  ? ?Socioeconomic History  ? Marital status: Legally Separated  ?  Spouse name: Not on file  ? Number of children: Not on file  ? Years of education: Not on file  ? Highest education level: Not on file  ?Occupational History  ? Not on file  ?Tobacco Use  ? Smoking status: Former  ?  Years: 20.00  ?  Types: Cigarettes  ?  Quit date: 11/2008  ?  Years since quitting: 12.9  ? Smokeless tobacco: Never  ?Vaping Use  ? Vaping Use: Never used  ?Substance and Sexual Activity  ? Alcohol use: Not Currently  ? Drug use: Never  ? Sexual activity: Not on file  ?Other Topics Concern  ? Not on file  ?Social History Narrative  ? Not on file  ? ?Social Determinants of Health  ? ?Financial Resource Strain: Not on file  ?Food Insecurity: Not on file  ?Transportation Needs: Not on file  ?Physical Activity: Not on file  ?Stress: Not on file  ?Social Connections: Not on file  ?Intimate Partner Violence: Not on file  ? ? ? ?  BP 140/78   Pulse 73   Ht 6\' 1"  (1.854 m)   Wt 264 lb 12.8 oz (120.1 kg)   SpO2 95%   BMI 34.94 kg/m?  ? ?Physical Exam: ? ?Well appearing NAD ?HEENT: Unremarkable ?Neck:  No JVD, no thyromegally ?Lymphatics:  No adenopathy ?Back:  No CVA tenderness ?Lungs:  Clear with no wheezes ?HEART:  Regular rate rhythm, no murmurs, no rubs, no clicks ?Abd:  soft, positive bowel sounds, no organomegally, no rebound, no guarding ?Ext:  2 plus pulses, no edema, no cyanosis, no clubbing ?Skin:  No rashes no nodules ?Neuro:  CN II through XII intact, motor grossly intact ? ?EKG - nsr ? ? ?Assess/Plan:  ?1. Atrial flutter - he is maintaining NSR after atrial flutter RFA. He will stop the eliquis for now. As he is at some risk for atrial fib, I will continue the flecainide for another year with plans to stop flecainide in a year if he has not had any symptoms. ?2. Coags - he  will stop the eliquis. No bleeding ?3. Dyslipidemia - continue lipitor.  ?4. HTN - his SBP is up a bit so he will continue the calcium channel and beta blocker.  ? ?Caleb Overlie Perrin Eddleman,MD ?

## 2022-04-06 ENCOUNTER — Other Ambulatory Visit: Payer: Self-pay | Admitting: Student

## 2022-04-10 ENCOUNTER — Encounter: Payer: Self-pay | Admitting: Internal Medicine

## 2022-04-11 MED ORDER — FLECAINIDE ACETATE 100 MG PO TABS: 100.0000 mg | ORAL_TABLET | Freq: Two times a day (BID) | ORAL | 6 refills | Status: DC

## 2022-07-14 ENCOUNTER — Other Ambulatory Visit: Payer: Self-pay

## 2022-07-14 MED ORDER — METOPROLOL SUCCINATE ER 50 MG PO TB24: 50.0000 mg | ORAL_TABLET | Freq: Two times a day (BID) | ORAL | 3 refills | Status: DC

## 2022-08-05 ENCOUNTER — Ambulatory Visit (HOSPITAL_COMMUNITY)
Admission: EM | Admit: 2022-08-05 | Discharge: 2022-08-05 | Disposition: A | Discharge status: Home / Self Care | Payer: 59 | Attending: Physician Assistant | Admitting: Physician Assistant

## 2022-08-05 ENCOUNTER — Encounter (HOSPITAL_COMMUNITY): Payer: Self-pay

## 2022-08-05 DIAGNOSIS — J069 Acute upper respiratory infection, unspecified: Secondary | ICD-10-CM

## 2022-08-05 DIAGNOSIS — R058 Other specified cough: Secondary | ICD-10-CM | POA: Insufficient documentation

## 2022-08-05 DIAGNOSIS — I483 Typical atrial flutter: Secondary | ICD-10-CM | POA: Diagnosis not present

## 2022-08-05 DIAGNOSIS — Z1152 Encounter for screening for COVID-19: Secondary | ICD-10-CM | POA: Insufficient documentation

## 2022-08-05 DIAGNOSIS — E119 Type 2 diabetes mellitus without complications: Secondary | ICD-10-CM | POA: Diagnosis not present

## 2022-08-05 DIAGNOSIS — Z87891 Personal history of nicotine dependence: Secondary | ICD-10-CM | POA: Diagnosis not present

## 2022-08-05 DIAGNOSIS — I10 Essential (primary) hypertension: Secondary | ICD-10-CM | POA: Insufficient documentation

## 2022-08-05 DIAGNOSIS — Z794 Long term (current) use of insulin: Secondary | ICD-10-CM | POA: Diagnosis not present

## 2022-08-05 DIAGNOSIS — Z79899 Other long term (current) drug therapy: Secondary | ICD-10-CM | POA: Diagnosis not present

## 2022-08-05 LAB — SARS CORONAVIRUS 2 (TAT 6-24 HRS): SARS Coronavirus 2: NEGATIVE

## 2022-08-05 MED ORDER — PREDNISONE 20 MG PO TABS: 40.0000 mg | ORAL_TABLET | Freq: Every day | ORAL | 0 refills | Status: AC

## 2022-08-05 MED ORDER — AMOXICILLIN-POT CLAVULANATE 875-125 MG PO TABS: 1.0000 | ORAL_TABLET | Freq: Two times a day (BID) | ORAL | 0 refills | Status: DC

## 2022-08-05 NOTE — Discharge Instructions (Addendum)
  Please follow up with your primary care provider in the next few weeks.

## 2022-08-05 NOTE — ED Triage Notes (Signed)
Chief Complaint: congestion, fever, productive cough with green mucus yesterday mucus was dark brown. Patient having chills and body aches.  No history of respiratory issues.   Onset: 3 days   Prescriptions or OTC medications tried: Yes- tylenol     with mild relief  Sick exposure: No  New foods, medications, or products: No  Recent Travel: No

## 2022-08-05 NOTE — ED Provider Notes (Signed)
[facility]    CSN: [id number] Arrival date & time: 08/05/22  0347      History   Chief Complaint Chief Complaint  Patient presents with   Cough   Generalized Body Aches    HPI Caleb [name] is a 62 y.o. male.   Patient here today for evaluation of cough, congestion, fever and body aches he has had the last 3 days.  He reports that cough is productive of green and brown sputum at times.  He denies any history of asthma or COPD.  He does note a rattle in his chest.  He has not had any vomiting or diarrhea.  He did have 1 day of nausea but this resolved.  He has tried over-the-counter medication without resolution.  The history is provided by the patient.    Past Medical History:  Diagnosis Date   Diabetes mellitus without complication ([place])    Type II   Dysrhythmia    aFlutter   Hypertension     Patient Active Problem List   Diagnosis Date Noted   Lumbar radiculopathy 09/30/2020   Essential hypertension 08/28/2020   COVID-19 virus infection 04/06/2020   Typical atrial flutter ([place])    Sepsis ([place]) 04/04/2020   Hyponatremia 04/04/2020   Osteomyelitis of lumbar spine ([place]) 04/04/2020   Diskitis 04/03/2020   Diabetes mellitus type 2 with complications, uncontrolled 03/11/2020   Gout 03/11/2020    Past Surgical History:  Procedure Laterality Date   A-FLUTTER ABLATION N/A 08/02/2021   Procedure: A-FLUTTER ABLATION;  Surgeon: Evans Lance, MD;  Location: [place] CV LAB;  Service: Cardiovascular;  Laterality: N/A;   A-FLUTTER ABLATION N/A 09/29/2021   Procedure: A-FLUTTER ABLATION;  Surgeon: Evans Lance, MD;  Location: [place] CV LAB;  Service: Cardiovascular;  Laterality: N/A;   KNEE SURGERY     ROTATOR CUFF REPAIR Right        Home Medications    Prior to Admission medications   Medication Sig Start Date End Date Taking? Authorizing Provider  amoxicillin-clavulanate (AUGMENTIN) 875-125 MG tablet Take 1 tablet by mouth every 12  (twelve) hours. 08/05/22  Yes Francene Finders, PA-C  atorvastatin (LIPITOR) 10 MG tablet Take 10 mg by mouth every evening. 06/13/21  Yes [provider]  flecainide (TAMBOCOR) 100 MG tablet Take 1 tablet (100 mg total) by mouth every 12 (twelve) hours. 04/11/22  Yes Evans Lance, MD  metFORMIN (GLUCOPHAGE) 500 MG tablet Take 1 mg by mouth 3 (three) times daily. Take 2 tablets in the am and 1 tablet in the pm   Yes [provider]  metoprolol succinate (TOPROL-XL) 50 MG 24 hr tablet Take 1 tablet (50 mg total) by mouth 2 (two) times daily. 07/14/22  Yes Evans Lance, MD  predniSONE (DELTASONE) 20 MG tablet Take 2 tablets (40 mg total) by mouth daily with breakfast for 5 days. 08/05/22 08/10/22 Yes Francene Finders, PA-C  Blood Glucose Monitoring Suppl (TRUE METRIX AIR GLUCOSE METER) DEVI Check CBG BID 03/11/20   Hilts, Legrand Como, MD  diltiazem (CARDIZEM) 30 MG tablet Take 1 tablet (30 mg total) by mouth as needed. 07/02/21   Shirley Friar, PA-C    Family History Family History  Problem Relation Age of Onset   Cancer Father    Lung cancer Father    Healthy Sister    Diabetes Neg Hx    Heart attack Neg Hx    Gout Neg Hx     Social History  Social History   Tobacco Use   Smoking status: Former    Years: 20.00    Types: Cigarettes    Quit date: 11/2008    Years since quitting: 13.7   Smokeless tobacco: Never  Vaping Use   Vaping Use: Never used  Substance Use Topics   Alcohol use: Not Currently   Drug use: Never     Allergies   Patient has no known allergies.   Review of Systems Review of Systems  Constitutional:  Positive for chills and fever.  HENT:  Positive for congestion. Negative for ear pain and sore throat.   Eyes:  Negative for discharge and redness.  Respiratory:  Positive for cough and wheezing. Negative for shortness of breath.   Gastrointestinal:  Negative for abdominal pain, nausea and vomiting.     Physical Exam Triage Vital  Signs ED Triage Vitals  Enc Vitals Group     BP      Pulse      Resp      Temp      Temp src      SpO2      Weight      Height      Head Circumference      Peak Flow      Pain Score      Pain Loc      Pain Edu?      Excl. in [place]?    No data found.  Updated Vital Signs BP 115/69 (BP Location: Left Arm)   Pulse 71   Temp 99.2 F (37.3 C) (Oral)   Resp 16   Ht 6' (1.829 m)   Wt 260 lb (117.9 kg)   SpO2 95%   BMI 35.26 kg/m      Physical Exam Vitals and nursing note reviewed.  Constitutional:      General: He is not in acute distress.    Appearance: Normal appearance. He is not ill-appearing.  HENT:     Head: Normocephalic and atraumatic.     Nose: Congestion present.     Mouth/Throat:     Mouth: Mucous membranes are moist.     Pharynx: Oropharynx is clear. No oropharyngeal exudate or posterior oropharyngeal erythema.  Eyes:     Conjunctiva/sclera: Conjunctivae normal.  Cardiovascular:     Rate and Rhythm: Normal rate and regular rhythm.     Heart sounds: Normal heart sounds. No murmur heard. Pulmonary:     Effort: Pulmonary effort is normal. No respiratory distress.     Breath sounds: Wheezing and rhonchi present. No rales.     Comments: Mild diffuse wheezing, rhonchi Skin:    General: Skin is warm and dry.  Neurological:     Mental Status: He is alert.  Psychiatric:        Mood and Affect: Mood normal.        Thought Content: Thought content normal.      UC Treatments / Results  Labs (all labs ordered are listed, but only abnormal results are displayed) Labs Reviewed  SARS CORONAVIRUS 2 (TAT 6-24 HRS)    EKG   Radiology No results found.  Procedures Procedures (including critical care time)  Medications Ordered in UC Medications - No data to display  Initial Impression / Assessment and Plan / UC Course  I have reviewed the triage vital signs and the nursing notes.  Pertinent labs & imaging results that were available during my care of  the patient were reviewed by me and considered  in my medical decision making (see chart for details).    Suspect viral etiology of symptoms, will screen for COVID.  Patient outside of treatment window for influenza.  Low suspicion for pneumonia however questionable underlying COPD given exam findings.  Will treat with steroid burst and antibiotic to ensure secondary bacterial infection coverage as well as treatment of possible COPD exacerbation.  Recommended further evaluation by primary care provider within the next few weeks.  Advise sooner follow-up with any worsening or failure to improve.  Final Clinical Impressions(s) / UC Diagnoses   Final diagnoses:  Viral upper respiratory tract infection     Discharge Instructions       Please follow up with your primary care provider in the next few weeks.      ED Prescriptions     Medication Sig Dispense Auth. Provider   predniSONE (DELTASONE) 20 MG tablet Take 2 tablets (40 mg total) by mouth daily with breakfast for 5 days. 10 tablet Tomi Bamberger, PA-C   amoxicillin-clavulanate (AUGMENTIN) 875-125 MG tablet Take 1 tablet by mouth every 12 (twelve) hours. 14 tablet Tomi Bamberger, PA-C      PDMP not reviewed this encounter.   Tomi Bamberger, PA-C 08/05/22 [phone]

## 2022-09-10 LAB — LAB REPORT - SCANNED
A1c: 6.2
Albumin, Urine POC: 25.1
Albumin/Creatinine Ratio, Urine, POC: 13
Creatinine, POC: 197.5 mg/dL
EGFR: 85

## 2022-09-14 ENCOUNTER — Telehealth: Payer: Self-pay

## 2022-09-14 DIAGNOSIS — I483 Typical atrial flutter: Secondary | ICD-10-CM

## 2022-09-14 NOTE — Telephone Encounter (Signed)
-----   Message from Damian Leavell, RN sent at 09/12/2022  7:50 AM EST ----- Regarding: FW: needs echo prior to one year follow up (valves) order at [facility]!!! This Pt needs a repeat echo prior to his follow up with [facility] that needs to be scheduled. He would like his echo at the [place] location.  Sonia Baller ----- Message ----- From: Damian Leavell, RN Sent: 09/12/2022  12:00 AM EST To: Damian Leavell, RN Subject: needs echo prior to one year follow up (valv#

## 2022-09-30 ENCOUNTER — Telehealth (HOSPITAL_BASED_OUTPATIENT_CLINIC_OR_DEPARTMENT_OTHER): Payer: Self-pay | Admitting: Internal Medicine

## 2022-09-30 NOTE — Telephone Encounter (Signed)
Let message for patient to call and scee the Echocardiogram ordered by Dr. Cristopher Peru

## 2022-10-31 ENCOUNTER — Other Ambulatory Visit: Payer: Self-pay | Admitting: Internal Medicine

## 2022-10-31 NOTE — Telephone Encounter (Signed)
Please call pt to schedule overdue follow-up appointment with Dr. Ladona Ridgel for refills. Thank you!

## 2022-11-04 ENCOUNTER — Ambulatory Visit (INDEPENDENT_AMBULATORY_CARE_PROVIDER_SITE_OTHER): Payer: 59

## 2022-11-04 DIAGNOSIS — I483 Typical atrial flutter: Secondary | ICD-10-CM | POA: Diagnosis not present

## 2022-11-04 LAB — ECHOCARDIOGRAM COMPLETE
AR max vel: 1.34 cm2
AV Area VTI: 1.31 cm2
AV Area mean vel: 1.29 cm2
AV Mean grad: 28 mmHg
AV Peak grad: 47.3 mmHg
AV Vena cont: 0.55 cm
Ao pk vel: 3.44 m/s
Area-P 1/2: 3.6 cm2
P 1/2 time: 365 msec
S' Lateral: 3.42 cm
Single Plane A2C EF: 63.3 %

## 2022-11-07 ENCOUNTER — Other Ambulatory Visit: Payer: Self-pay | Admitting: *Deleted

## 2022-11-07 MED ORDER — METOPROLOL SUCCINATE ER 50 MG PO TB24: 50.0000 mg | ORAL_TABLET | Freq: Two times a day (BID) | ORAL | 3 refills | Status: DC

## 2022-11-07 MED ORDER — FLECAINIDE ACETATE 100 MG PO TABS: 100.0000 mg | ORAL_TABLET | Freq: Two times a day (BID) | ORAL | 6 refills | Status: DC

## 2022-11-11 ENCOUNTER — Telehealth: Payer: Self-pay

## 2022-11-11 NOTE — Telephone Encounter (Signed)
-----   Message from Marinus Maw, MD sent at 11/08/2022  9:15 PM EDT ----- Moderate AS, mldy enlarged LA, preserved LV function.

## 2022-12-23 ENCOUNTER — Encounter: Payer: Self-pay | Admitting: Internal Medicine

## 2022-12-23 ENCOUNTER — Ambulatory Visit: Payer: 59 | Attending: Internal Medicine | Admitting: Internal Medicine

## 2022-12-23 VITALS — BP 132/74 | HR 67 | Ht 72.0 in | Wt 259.0 lb

## 2022-12-23 DIAGNOSIS — I483 Typical atrial flutter: Secondary | ICD-10-CM | POA: Diagnosis not present

## 2022-12-23 NOTE — Progress Notes (Signed)
HPI Caleb [name] returns today for followup. He is a pleasant 62 yo man with a h/o atrial flutter who underwent initial EP study and ablation and we never achieved isthmus block and had to stop due to high fluoroscopy time. The patient then developed recurrent flutter and has undergone repeat EP study and ablation with CARTO. He has maintained NSR with no symptoms of atrial flutter. He feels well. No palpitations. When I saw him last we left him on flecainide.  No Known Allergies   Current Outpatient Medications  Medication Sig Dispense Refill   amoxicillin-clavulanate (AUGMENTIN) 875-125 MG tablet Take 1 tablet by mouth every 12 (twelve) hours. 14 tablet 0   atorvastatin (LIPITOR) 20 MG tablet Take 20 mg by mouth daily.     Blood Glucose Monitoring Suppl (TRUE METRIX AIR GLUCOSE METER) DEVI Check CBG BID 1 each 11   colchicine 0.6 MG tablet Take 0.6 mg by mouth as needed (gout).     diltiazem (CARDIZEM) 30 MG tablet Take 1 tablet (30 mg total) by mouth as needed. 90 tablet 3   flecainide (TAMBOCOR) 100 MG tablet Take 1 tablet (100 mg total) by mouth every 12 (twelve) hours. 60 tablet 6   metFORMIN (GLUCOPHAGE) 500 MG tablet Take 1 mg by mouth 3 (three) times daily. Take 2 tablets in the am and 1 tablet in the pm     metoprolol succinate (TOPROL-XL) 50 MG 24 hr tablet Take 1 tablet (50 mg total) by mouth 2 (two) times daily. 60 tablet 3   sildenafil (REVATIO) 20 MG tablet Take 20 mg by mouth as needed.     No current facility-administered medications for this visit.     Past Medical History:  Diagnosis Date   Diabetes mellitus without complication (HCC)    Type II   Dysrhythmia    aFlutter   Hypertension     ROS:   All systems reviewed and negative except as noted in the HPI.   Past Surgical History:  Procedure Laterality Date   A-FLUTTER ABLATION N/A 08/02/2021   Procedure: A-FLUTTER ABLATION;  Surgeon: Marinus Maw, MD;  [facility]: [facility] INVASIVE CV LAB;  Service:  Cardiovascular;  Laterality: N/A;   A-FLUTTER ABLATION N/A 09/29/2021   Procedure: A-FLUTTER ABLATION;  Surgeon: Marinus Maw, MD;  Location: [facility] INVASIVE CV LAB;  Service: Cardiovascular;  Laterality: N/A;   KNEE SURGERY     ROTATOR CUFF REPAIR Right      Family History  Problem Relation Age of Onset   Cancer Father    Lung cancer Father    Healthy Sister    Diabetes Neg Hx    Heart attack Neg Hx    Gout Neg Hx      Social History   Socioeconomic History   Marital status: Legally Separated    Spouse name: Not on file   Number of children: Not on file   Years of education: Not on file   Highest education level: Not on file  Occupational History   Not on file  Tobacco Use   Smoking status: Former    Years: 20    Types: Cigarettes    Quit date: 11/2008    Years since quitting: 14.1   Smokeless tobacco: Never  Vaping Use   Vaping Use: Never used  Substance and Sexual Activity   Alcohol use: Not Currently   Drug use: Never   Sexual activity: Not on file  Other Topics Concern   Not on  file  Social History Narrative   Not on file   Social Determinants of Health   Financial Resource Strain: Not on file  Food Insecurity: Not on file  Transportation Needs: Not on file  Physical Activity: Not on file  Stress: Not on file  Social Connections: Not on file  Intimate Partner Violence: Not on file     BP 132/74   Pulse 67   Ht 6' (1.829 m)   Wt 259 lb (117.5 kg)   SpO2 97%   BMI 35.13 kg/m   Physical Exam:  Well appearing 62 yo man, overweight, NAD HEENT: Unremarkable Neck:  No JVD, no thyromegally Lymphatics:  No adenopathy Back:  No CVA tenderness Lungs:  Clear with no wheezes HEART:  Regular rate rhythm, no murmurs, no rubs, no clicks Abd:  soft, positive bowel sounds, no organomegally, no rebound, no guarding Ext:  2 plus pulses, no edema, no cyanosis, no clubbing Skin:  No rashes no nodules Neuro:  CN II through XII intact, motor grossly  intact  EKG - nsr   Assess/Plan: 1. Atrial flutter - he is maintaining NSR after atrial flutter RFA. He will stop the eliquis for now. He has been on flecainide and had no arrhythmias. I will continue this medication for now.  2. Obesity - I encouraged the patient to lose weight.  3. Dyslipidemia - continue lipitor.  4. HTN - his SBP is up a bit so he will continue the calcium channel and beta blocker.    Caleb Gowda Shamon Lobo,MD

## 2022-12-23 NOTE — Patient Instructions (Signed)
Medication Instructions:  Your physician recommends that you continue on your current medications as directed. Please refer to the Current Medication list given to you today.  *If you need a refill on your cardiac medications before your next appointment, please call your pharmacy*   Follow-Up: At [facility], you and your health needs are our priority.  As part of our continuing mission to provide you with exceptional heart care, we have created designated Provider Care Teams.  These Care Teams include your primary Cardiologist (physician) and Advanced Practice Providers (APPs -  Physician Assistants and Nurse Practitioners) who all work together to provide you with the care you need, when you need it.  Your next appointment:   1 year(s)  Provider:   You may see Gregg Taylor, MD or one of the following Advanced Practice Providers on your designated Care Team:   Renee Ursuy, PA-C Michael "Andy" Tillery, PA-C Suzann Riddle, NP    

## 2023-03-08 ENCOUNTER — Other Ambulatory Visit: Payer: Self-pay | Admitting: Internal Medicine

## 2023-05-30 ENCOUNTER — Other Ambulatory Visit: Payer: Self-pay | Admitting: Internal Medicine

## 2023-09-22 LAB — LAB REPORT - SCANNED
A1c: 6.3
Albumin, Urine POC: 4.6
Albumin/Creatinine Ratio, Urine, POC: 4
Creatinine, POC: 107 mg/dL
EGFR: 93

## 2023-10-06 ENCOUNTER — Other Ambulatory Visit: Payer: Self-pay | Admitting: Internal Medicine

## 2023-10-16 ENCOUNTER — Encounter: Payer: Self-pay | Admitting: Nurse Practitioner

## 2023-12-14 ENCOUNTER — Ambulatory Visit: Payer: Self-pay | Admitting: Nurse Practitioner

## 2023-12-14 ENCOUNTER — Other Ambulatory Visit (INDEPENDENT_AMBULATORY_CARE_PROVIDER_SITE_OTHER)

## 2023-12-14 ENCOUNTER — Encounter: Payer: Self-pay | Admitting: Nurse Practitioner

## 2023-12-14 VITALS — BP 124/70 | HR 84 | Ht 71.25 in | Wt 262.1 lb

## 2023-12-14 DIAGNOSIS — R011 Cardiac murmur, unspecified: Secondary | ICD-10-CM

## 2023-12-14 DIAGNOSIS — R195 Other fecal abnormalities: Secondary | ICD-10-CM

## 2023-12-14 DIAGNOSIS — D649 Anemia, unspecified: Secondary | ICD-10-CM | POA: Diagnosis not present

## 2023-12-14 DIAGNOSIS — Z83719 Family history of colon polyps, unspecified: Secondary | ICD-10-CM

## 2023-12-14 DIAGNOSIS — K625 Hemorrhage of anus and rectum: Secondary | ICD-10-CM | POA: Diagnosis not present

## 2023-12-14 DIAGNOSIS — Z860101 Personal history of adenomatous and serrated colon polyps: Secondary | ICD-10-CM

## 2023-12-14 LAB — IBC + FERRITIN
Ferritin: 14.6 ng/mL — ABNORMAL LOW (ref 22.0–322.0)
Iron: 32 ug/dL — ABNORMAL LOW (ref 42–165)
Saturation Ratios: 6.1 % — ABNORMAL LOW (ref 20.0–50.0)
TIBC: 520.8 ug/dL — ABNORMAL HIGH (ref 250.0–450.0)
Transferrin: 372 mg/dL — ABNORMAL HIGH (ref 212.0–360.0)

## 2023-12-14 LAB — CBC
HCT: 38.1 % — ABNORMAL LOW (ref 39.0–52.0)
Hemoglobin: 12.1 g/dL — ABNORMAL LOW (ref 13.0–17.0)
MCHC: 31.8 g/dL (ref 30.0–36.0)
MCV: 76 fl — ABNORMAL LOW (ref 78.0–100.0)
Platelets: 199 10*3/uL (ref 150.0–400.0)
RBC: 5.02 Mil/uL (ref 4.22–5.81)
RDW: 17.6 % — ABNORMAL HIGH (ref 11.5–15.5)
WBC: 4.6 10*3/uL (ref 4.0–10.5)

## 2023-12-14 MED ORDER — NA SULFATE-K SULFATE-MG SULF 17.5-3.13-1.6 GM/177ML PO SOLN: 1.0000 | Freq: Once | ORAL | 0 refills | Status: AC

## 2023-12-14 NOTE — Patient Instructions (Addendum)
 Your provider has requested that you go to the basement level for lab work before leaving today. Press "B" on the elevator. The lab is located at the first door on the left as you exit the elevator.  You have been scheduled for a Endoscopy and Colonoscopy. Please follow written instructions given to you at your visit today.   If you use inhalers (even only as needed), please bring them with you on the day of your procedure.  DO NOT TAKE 7 DAYS PRIOR TO TEST- Trulicity (dulaglutide) Ozempic, Wegovy (semaglutide) Mounjaro (tirzepatide) Bydureon Bcise (exanatide extended release)  DO NOT TAKE 1 DAY PRIOR TO YOUR TEST Rybelsus (semaglutide) Adlyxin (lixisenatide) Victoza (liraglutide) Byetta (exanatide)  Thank you for trusting me with your gastrointestinal care!   Everett Hitt, NP ___________________________________________________________________________  Please follow up sooner if symptoms increase or worsen   Due to recent changes in healthcare laws, you may see the results of your imaging and laboratory studies on MyChart before your provider has had a chance to review them.  We understand that in some cases there may be results that are confusing or concerning to you. Not all laboratory results come back in the same time frame and the provider may be waiting for multiple results in order to interpret others.  Please give us  48 hours in order for your provider to thoroughly review all the results before contacting the office for clarification of your results.   _______________________________________________________  If your blood pressure at your visit was 140/90 or greater, please contact your primary care physician to follow up on this.  _______________________________________________________  If you are age 37 or older, your body mass index should be between 23-30. Your Body mass index is 36.3 kg/m. If this is out of the aforementioned range listed, please consider  follow up with your Primary Care Provider.  If you are age 16 or younger, your body mass index should be between 19-25. Your Body mass index is 36.3 kg/m. If this is out of the aformentioned range listed, please consider follow up with your Primary Care Provider.   ________________________________________________________  The [facility] providers would like to encourage you to use MYCHART to communicate with providers for non-urgent requests or questions.  Due to long hold times on the telephone, sending your provider a message by [facility] may be a faster and more efficient way to get a response.  Please allow 48 business hours for a response.  Please remember that this is for non-urgent requests.  _______________________________________________________

## 2023-12-14 NOTE — Progress Notes (Addendum)
 12/14/2023 Caleb [name] [id number] 19-Dec-1960   CHIEF COMPLAINT: Schedule colonoscopy, low hemoglobin level  HISTORY OF PRESENT ILLNESS: Caleb [name] is a 63 year old male with a past medical history of hypertension, atrial flutter s/p ablation x 2 not on AC, diabetes mellitus type II, gout and colon polyps.  He presents to our office today as referred by Alfonso Goltz, NP for further evaluation regarding anemia and to schedule colonoscopy.  He completed a Hemosure stool test which resulted + 09/2023.  He underwent laboratory studies 09/2023 which showed mild anemia, his most recent lab results are not available in epic or Care Everywhere for review. Prior labs 09/06/2022 showed a Hemoglobin level of 14.9.  He denies having any nausea or vomiting.  No heartburn or dysphagia.  He typically passes a normal formed brown bowel movement daily.  He infrequently sees a small amount of bright red blood on the toilet tissue.  He denies having any constipation or straining.  He underwent a colonoscopy 11/09/2000 by Dr. Cloretta which identified one 5 mm tubular adenomatous polyp removed from the sigmoid colon, erythema present to the descending colon and internal/external hemorrhoids.  Colon biopsies were negative for colitis/IBD.  He denies having any further colonoscopies since then.  No known family history of colon polyps or colorectal cancer.  He has a history of a flutter status post ablation x 2, no longer on anticoagulation and remains on Flecainide .  He last saw his cardiologist Dr. Waddell 12/2022 and his cardiac status was stable at that time.  He is due for routine annual cardiology follow-up at this time.  He denies having any chest pain, palpitations or shortness of breath.  He has a heart murmur on exam which he stated was not new.  ECHO 10/2022 showed LVEF 60 to 65% which showed severe thickening to the aortic valve with moderate aortic regurgitation and stenosis.  See full echo report  below.  ECHO 11/04/2022:  Left ventricular ejection fraction, by estimation, is 60 to 65%. The left ventricle has normal function. The left ventricle has no regional wall motion abnormalities. Left ventricular diastolic parameters were normal. The average left ventricular global longitudinal strain is -16.2 %. The global longitudinal strain is normal. 1. 2. Right ventricular systolic function is normal. The right ventricular size is normal. 3. Left atrial size was mildly dilated. The mitral valve is abnormal. No evidence of mitral valve regurgitation. No evidence of mitral stenosis. 4. Cannot tell if valve is bicuspid or tri leaflet . The aortic valve is normal in structure. There is severe calcifcation of the aortic valve. There is severe thickening of the aortic valve. Aortic valve regurgitation is moderate. Moderate aortic valve stenosis. 5. The inferior vena cava is normal in size with greater than 50% respiratory variability, suggesting right atrial pressure of 3 mmHg.  Past Medical History:  Diagnosis Date   Diabetes mellitus without complication (HCC)    Type II   Dysrhythmia    aFlutter   Gout    HLD (hyperlipidemia)    Hypertension    Past Surgical History:  Procedure Laterality Date   A-FLUTTER ABLATION N/A 08/02/2021   Procedure: A-FLUTTER ABLATION;  Surgeon: Waddell Danelle ORN, MD;  Location: [facility] INVASIVE CV LAB;  Service: Cardiovascular;  Laterality: N/A;   A-FLUTTER ABLATION N/A 09/29/2021   Procedure: A-FLUTTER ABLATION;  Surgeon: Waddell Danelle ORN, MD;  Location: [facility] INVASIVE CV LAB;  Service: Cardiovascular;  Laterality: N/A;   ANTERIOR CRUCIATE LIGAMENT REPAIR Right  x 2   LUMBAR FUSION     L3-L4   ROTATOR CUFF REPAIR Right    Social History: He has 2 sons.  Retired.  He quit smoking cigarettes 15 years ago.  No alcohol use.  No drug use.  Family History: family history includes Atrial fibrillation in his mother; Dementia in his maternal grandmother; Healthy in his sister;  Lung cancer in his father; Other in his maternal grandfather; Pneumonia in his paternal grandmother; Stroke in his paternal grandfather.  No known family history of esophageal, gastric or colon cancer.  No Known Allergies    Outpatient Encounter Medications as of 12/14/2023  Medication Sig   atorvastatin (LIPITOR) 20 MG tablet Take 20 mg by mouth daily.   Blood Glucose Monitoring Suppl (TRUE METRIX AIR GLUCOSE METER) DEVI Check CBG BID   colchicine 0.6 MG tablet Take 0.6 mg by mouth as needed (gout).   flecainide  (TAMBOCOR ) 100 MG tablet TAKE 1 TABLET BY MOUTH EVERY 12 HOURS   metFORMIN  (GLUCOPHAGE ) 500 MG tablet Take 1 mg by mouth 3 (three) times daily. Take 2 tablets in the am and 1 tablet in the pm   metoprolol  succinate (TOPROL -XL) 50 MG 24 hr tablet Take 1 tablet by mouth twice daily   sildenafil  (REVATIO ) 20 MG tablet Take 20 mg by mouth as needed.   [DISCONTINUED] amoxicillin -clavulanate (AUGMENTIN ) 875-125 MG tablet Take 1 tablet by mouth every 12 (twelve) hours.   [DISCONTINUED] diltiazem  (CARDIZEM ) 30 MG tablet Take 1 tablet (30 mg total) by mouth as needed.   No facility-administered encounter medications on file as of 12/14/2023.   REVIEW OF SYSTEMS:  Gen: Denies fever, sweats or chills. No weight loss.  CV: Denies chest pain, palpitations or edema. Resp: Denies cough, shortness of breath of hemoptysis.  GI See HPI.  GU: Denies urinary burning, blood in urine, increased urinary frequency or incontinence. MS: Denies joint pain, muscles aches or weakness. Derm: Denies rash, itchiness, skin lesions or unhealing ulcers. Psych: Denies depression, anxiety, memory loss or confusion. Heme: Denies bruising, easy bleeding. Neuro:  Denies headaches, dizziness or paresthesias. Endo:  + Diabetes.  PHYSICAL EXAM: BP 124/70 (BP Location: Left Arm, Patient Position: Sitting, Cuff Size: Large)   Pulse 84   Ht 5' 11.25 (1.81 m) Comment: height measured without shoes  Wt 262 lb 2 oz (118.9  kg)   BMI 36.30 kg/m  General: 63 year old male in no acute distress. Head: Normocephalic and atraumatic. Eyes:  Sclerae non-icteric, conjunctive pink. Ears: Normal auditory acuity. Mouth: Dentition intact. No ulcers or lesions.  Neck: Supple, no lymphadenopathy or thyromegaly.  Lungs: Clear bilaterally to auscultation without wheezes, crackles or rhonchi. Heart: Regular rate and rhythm. II/VI systolic murmur, rub or gallop appreciated.  Abdomen: Soft, nontender, nondistended. No masses. No hepatosplenomegaly. Normoactive bowel sounds x 4 quadrants.  Rectal: Deferred Musculoskeletal: Symmetrical with no gross deformities. Skin: Warm and dry. No rash or lesions on visible extremities. Extremities: No edema. Neurological: Alert oriented x 4, no focal deficits.  Psychological: Alert and cooperative. Normal mood and affect.  ASSESSMENT AND PLAN:  63 year old male with a history of tubular adenomatous polyp for colonoscopy 11/2000 with a positive Hemosure 09/2023.  Patient infrequently sees a small amount of bright red blood on the toilet tissue.  No known family history of colorectal cancer. -Colonoscopy benefits and risks discussed including risk with sedation, risk of bleeding, perforation and infection  Mild anemia per labs done by PCP 09/2023 - Request copy of CBC completed 09/2023 per PCP -  CBC, IBC + ferritin panel today  - If patient has iron deficiency anemia he will require and EGD and colonoscopy  - EGD and colonoscopy benefits and risks discussed including risk with sedation, risk of bleeding, perforation and infection   History of atrial flutter status post ablation x 2 no longer on AC and remains on flecainide   Heart murmur.  Echo 11/04/2022 showed LVEF 60 to 65%, severe thickening to the aortic valve with moderate aortic regurgitation and stenosis.  Patient is asymptomatic, no angina or DOE. - Patient instructed to schedule follow-up appointment with cardiology, last routine  follow-up was 12/2022  ADDENDUM:   Labs 12/14/2023 confirmed IDA. Iron 32. Ferritin 14.6. Saturation ratios 6.1.  Hg 12.1. Repeat Hg 12.6 on 12/29/2023.  Patient was seen by his cardiologist Dr. Danelle Birmingham 12/23/2023, cardiac status was stable at that time. No longer on Eliquis . Remains on Flecainide , Metoprolol  and Diltiazem .   Patient to proceed with EGD and colonoscopy with Dr. Abran 01/22/2024 as scheduled.    CC:  Barbra Odor, NP

## 2023-12-20 ENCOUNTER — Ambulatory Visit: Payer: Self-pay | Admitting: Nurse Practitioner

## 2023-12-22 ENCOUNTER — Other Ambulatory Visit: Payer: Self-pay

## 2023-12-22 DIAGNOSIS — D649 Anemia, unspecified: Secondary | ICD-10-CM

## 2023-12-22 DIAGNOSIS — R195 Other fecal abnormalities: Secondary | ICD-10-CM

## 2023-12-22 MED ORDER — FERROUS SULFATE 325 (65 FE) MG PO TABS: 325.0000 mg | ORAL_TABLET | Freq: Every day | ORAL | 1 refills | Status: DC

## 2024-01-01 ENCOUNTER — Other Ambulatory Visit: Payer: Self-pay | Admitting: Internal Medicine

## 2024-01-08 ENCOUNTER — Ambulatory Visit: Payer: Self-pay | Admitting: Nurse Practitioner

## 2024-01-08 ENCOUNTER — Other Ambulatory Visit (INDEPENDENT_AMBULATORY_CARE_PROVIDER_SITE_OTHER)

## 2024-01-08 DIAGNOSIS — R195 Other fecal abnormalities: Secondary | ICD-10-CM

## 2024-01-08 DIAGNOSIS — D649 Anemia, unspecified: Secondary | ICD-10-CM | POA: Diagnosis not present

## 2024-01-08 LAB — CBC WITH DIFFERENTIAL/PLATELET
Basophils Absolute: 0.1 10*3/uL (ref 0.0–0.1)
Basophils Relative: 1.5 % (ref 0.0–3.0)
Eosinophils Absolute: 0.1 10*3/uL (ref 0.0–0.7)
Eosinophils Relative: 2.3 % (ref 0.0–5.0)
HCT: 39.8 % (ref 39.0–52.0)
Hemoglobin: 12.6 g/dL — ABNORMAL LOW (ref 13.0–17.0)
Lymphocytes Relative: 31.4 % (ref 12.0–46.0)
Lymphs Abs: 1.2 10*3/uL (ref 0.7–4.0)
MCHC: 31.7 g/dL (ref 30.0–36.0)
MCV: 78.1 fl (ref 78.0–100.0)
Monocytes Absolute: 0.3 10*3/uL (ref 0.1–1.0)
Monocytes Relative: 9.3 % (ref 3.0–12.0)
Neutro Abs: 2 10*3/uL (ref 1.4–7.7)
Neutrophils Relative %: 55.5 % (ref 43.0–77.0)
Platelets: 208 10*3/uL (ref 150.0–400.0)
RBC: 5.09 Mil/uL (ref 4.22–5.81)
RDW: 20.2 % — ABNORMAL HIGH (ref 11.5–15.5)
WBC: 3.7 10*3/uL — ABNORMAL LOW (ref 4.0–10.5)

## 2024-01-22 ENCOUNTER — Encounter: Admitting: Internal Medicine

## 2024-01-22 ENCOUNTER — Encounter: Payer: Self-pay | Admitting: Internal Medicine

## 2024-01-22 ENCOUNTER — Ambulatory Visit (AMBULATORY_SURGERY_CENTER): Admitting: Internal Medicine

## 2024-01-22 VITALS — BP 131/83 | HR 67 | Temp 98.0°F | Resp 17 | Ht 71.0 in | Wt 262.0 lb

## 2024-01-22 DIAGNOSIS — D509 Iron deficiency anemia, unspecified: Secondary | ICD-10-CM | POA: Diagnosis not present

## 2024-01-22 DIAGNOSIS — D649 Anemia, unspecified: Secondary | ICD-10-CM

## 2024-01-22 DIAGNOSIS — K621 Rectal polyp: Secondary | ICD-10-CM | POA: Diagnosis not present

## 2024-01-22 DIAGNOSIS — K648 Other hemorrhoids: Secondary | ICD-10-CM | POA: Diagnosis not present

## 2024-01-22 DIAGNOSIS — Z1211 Encounter for screening for malignant neoplasm of colon: Secondary | ICD-10-CM

## 2024-01-22 DIAGNOSIS — D123 Benign neoplasm of transverse colon: Secondary | ICD-10-CM

## 2024-01-22 DIAGNOSIS — Z83719 Family history of colon polyps, unspecified: Secondary | ICD-10-CM

## 2024-01-22 DIAGNOSIS — K222 Esophageal obstruction: Secondary | ICD-10-CM

## 2024-01-22 DIAGNOSIS — D124 Benign neoplasm of descending colon: Secondary | ICD-10-CM

## 2024-01-22 DIAGNOSIS — Z860101 Personal history of adenomatous and serrated colon polyps: Secondary | ICD-10-CM

## 2024-01-22 DIAGNOSIS — D120 Benign neoplasm of cecum: Secondary | ICD-10-CM | POA: Diagnosis not present

## 2024-01-22 DIAGNOSIS — D128 Benign neoplasm of rectum: Secondary | ICD-10-CM

## 2024-01-22 DIAGNOSIS — R195 Other fecal abnormalities: Secondary | ICD-10-CM | POA: Diagnosis not present

## 2024-01-22 DIAGNOSIS — K5730 Diverticulosis of large intestine without perforation or abscess without bleeding: Secondary | ICD-10-CM

## 2024-01-22 DIAGNOSIS — K209 Esophagitis, unspecified without bleeding: Secondary | ICD-10-CM

## 2024-01-22 DIAGNOSIS — K2100 Gastro-esophageal reflux disease with esophagitis, without bleeding: Secondary | ICD-10-CM | POA: Diagnosis not present

## 2024-01-22 DIAGNOSIS — Z860100 Personal history of colon polyps, unspecified: Secondary | ICD-10-CM

## 2024-01-22 DIAGNOSIS — K6389 Other specified diseases of intestine: Secondary | ICD-10-CM

## 2024-01-22 DIAGNOSIS — D500 Iron deficiency anemia secondary to blood loss (chronic): Secondary | ICD-10-CM

## 2024-01-22 DIAGNOSIS — K635 Polyp of colon: Secondary | ICD-10-CM | POA: Diagnosis not present

## 2024-01-22 MED ORDER — SODIUM CHLORIDE 0.9 % IV SOLN: 500.0000 mL | INTRAVENOUS | Status: DC

## 2024-01-22 NOTE — Op Note (Signed)
 [facility] Patient Name: Caleb [name] Procedure Date: 01/22/2024 2:07 PM MRN: [id number] Endoscopist: Norleen SAILOR. Abran , MD, [id number] Age: 63 Referring MD:  Date of Birth: June 28, 1961 Gender: Male Account #: [id number] Procedure:                Upper GI endoscopy Indications:              Iron deficiency anemia, Heme positive stool Medicines:                Monitored Anesthesia Care Procedure:                Pre-Anesthesia Assessment:                           - Prior to the procedure, a History and Physical                            was performed, and patient medications and                            allergies were reviewed. The patient's tolerance of                            previous anesthesia was also reviewed. The risks                            and benefits of the procedure and the sedation                            options and risks were discussed with the patient.                            All questions were answered, and informed consent                            was obtained. Prior Anticoagulants: The patient has                            taken no anticoagulant or antiplatelet agents. ASA                            Grade Assessment: II - A patient with mild systemic                            disease. After reviewing the risks and benefits,                            the patient was deemed in satisfactory condition to                            undergo the procedure.                           After obtaining informed consent, the endoscope was  passed under direct vision. Throughout the                            procedure, the patient's blood pressure, pulse, and                            oxygen saturations were monitored continuously. The                            Olympus Scope [id number] was introduced through the                            mouth, and advanced to the second part of duodenum.                            The upper  GI endoscopy was accomplished without                            difficulty. The patient tolerated the procedure                            well. Scope In: Scope Out: Findings:                 The esophagus erosive esophagitis and a benign                            ringlike peptic stricture.                           The stomach revealed a hiatal hernia and mucosa                            suggesting portal hypertensive gastropathy.                           The examined duodenum was normal.                           The cardia and gastric fundus were normal on                            retroflexion, save hiatal hernia. Complications:            No immediate complications. Estimated Blood Loss:     Estimated blood loss: none. Impression:               1. GERD with esophagitis and stricture                           2. Hiatal hernia                           3. Question portal hypertensive gastropathy                           4. Otherwise normal exam. Recommendation:           -  Patient has a contact number available for                            emergencies. The signs and symptoms of potential                            delayed complications were discussed with the                            patient. Return to normal activities tomorrow.                            Written discharge instructions were provided to the                            patient.                           - Resume previous diet.                           - Continue present medications.                           - See colonoscopy report Norleen SAILOR. Abran, MD 01/22/2024 3:19:14 PM This report has been signed electronically.

## 2024-01-22 NOTE — Patient Instructions (Signed)
 Please read handouts provided. Continue present medications. Resume previous diet. Await pathology results. Refer to [facility] surgery. Schedule contrast CT scan.    YOU HAD AN ENDOSCOPIC PROCEDURE TODAY AT THE [facility]:   Refer to the procedure report that was given to you for any specific questions about what was found during the examination.  If the procedure report does not answer your questions, please call your gastroenterologist to clarify.  If you requested that your care partner not be given the details of your procedure findings, then the procedure report has been included in a sealed envelope for you to review at your convenience later.  YOU SHOULD EXPECT: Some feelings of bloating in the abdomen. Passage of more gas than usual.  Walking can help get rid of the air that was put into your GI tract during the procedure and reduce the bloating. If you had a lower endoscopy (such as a colonoscopy or flexible sigmoidoscopy) you may notice spotting of blood in your stool or on the toilet paper. If you underwent a bowel prep for your procedure, you may not have a normal bowel movement for a few days.  Please Note:  You might notice some irritation and congestion in your nose or some drainage.  This is from the oxygen used during your procedure.  There is no need for concern and it should clear up in a day or so.  SYMPTOMS TO REPORT IMMEDIATELY:  Following lower endoscopy (colonoscopy or flexible sigmoidoscopy):  Excessive amounts of blood in the stool  Significant tenderness or worsening of abdominal pains  Swelling of the abdomen that is new, acute  Fever of 100F or higher  Following upper endoscopy (EGD)  Vomiting of blood or coffee ground material  New chest pain or pain under the shoulder blades  Painful or persistently difficult swallowing  New shortness of breath  Fever of 100F or higher  Black, tarry-looking stools  For urgent or emergent issues, a  gastroenterologist can be reached at any hour by calling [phone]. Do not use MyChart messaging for urgent concerns.    DIET:  We do recommend a small meal at first, but then you may proceed to your regular diet.  Drink plenty of fluids but you should avoid alcoholic beverages for 24 hours.  ACTIVITY:  You should plan to take it easy for the rest of today and you should NOT DRIVE or use heavy machinery until tomorrow (because of the sedation medicines used during the test).    FOLLOW UP: Our staff will call the number listed on your records the next business day following your procedure.  We will call around 7:15- 8:00 am to check on you and address any questions or concerns that you may have regarding the information given to you following your procedure. If we do not reach you, we will leave a message.     If any biopsies were taken you will be contacted by phone or by letter within the next 1-3 weeks.  Please call us  at [phone] if you have not heard about the biopsies in 3 weeks.    SIGNATURES/CONFIDENTIALITY: You and/or your care partner have signed paperwork which will be entered into your electronic medical record.  These signatures attest to the fact that that the information above on your After Visit Summary has been reviewed and is understood.  Full responsibility of the confidentiality of this discharge information lies with you and/or your care-partner.

## 2024-01-22 NOTE — Progress Notes (Signed)
 Called to room to assist during endoscopic procedure.  Patient ID and intended procedure confirmed with present staff. Received instructions for my participation in the procedure from the performing physician.

## 2024-01-22 NOTE — Op Note (Signed)
 [facility] Patient Name: Caleb [name] Procedure Date: 01/22/2024 2:00 PM MRN: [id number] Endoscopist: Norleen SAILOR. Abran , MD, [id number] Age: 63 Referring MD:  Date of Birth: May 21, 1961 Gender: Male Account #: [id number] Procedure:                Colonoscopy with cold snare polypectomy x 4, hot x                            1, biopsies, sub-inject Indications:              Heme positive stool, Iron deficiency anemia Medicines:                Monitored Anesthesia Care Procedure:                Pre-Anesthesia Assessment:                           - Prior to the procedure, a History and Physical                            was performed, and patient medications and                            allergies were reviewed. The patient's tolerance of                            previous anesthesia was also reviewed. The risks                            and benefits of the procedure and the sedation                            options and risks were discussed with the patient.                            All questions were answered, and informed consent                            was obtained. Prior Anticoagulants: The patient has                            taken no anticoagulant or antiplatelet agents. ASA                            Grade Assessment: II - A patient with mild systemic                            disease. After reviewing the risks and benefits,                            the patient was deemed in satisfactory condition to                            undergo the procedure.  After obtaining informed consent, the colonoscope                            was passed under direct vision. Throughout the                            procedure, the patient's blood pressure, pulse, and                            oxygen saturations were monitored continuously. The                            CF [id number] #[id number] was introduced through the anus                             and advanced to the the cecum, identified by                            appendiceal orifice and ileocecal valve. The                            colonoscopy was performed without difficulty. The                            patient tolerated the procedure well. The quality                            of the bowel preparation was excellent. The                            ileocecal valve, appendiceal orifice, and rectum                            were photographed. Scope In: 2:19:00 PM Scope Out: 2:45:04 PM Scope Withdrawal Time: 0 hours 23 minutes 11 seconds  Total Procedure Duration: 0 hours 26 minutes 4 seconds  Findings:                 A 12 mm polyp was found in the rectum. The polyp                            was pedunculated. The polyp was removed with a hot                            snare. Resection and retrieval were complete.                           Four polyps were found in the descending colon and                            transverse colon. The polyps were 3 to 8 mm in                            size. These  polyps were removed with a cold snare.                            Resection and retrieval were complete.                           In the cecum was a 4 to 5 cm soft polypoid mass                            which was friable. This was biopsied with a cold                            forceps for histology. In addition there were 4                            pedunculated and semipedunculated polyps measuring                            between 10 and 25 mm. These were also in the cecum.                            Not biopsied or removed by intent. Just outside of                            the cecum (downstream to where the rectum) the                            colon was tattooed circumferentially with carbon                            black.                           Multiple diverticula were found in the entire                            colon. Internal hemorrhoids.                            The exam was otherwise without abnormality on                            direct and retroflexion views. Complications:            No immediate complications. Estimated Blood Loss:     Estimated blood loss: none. Impression:               - One 12 mm polyp in the rectum, removed with a hot                            snare. Resected and retrieved.                           - Four 3 to 8 mm polyps in the descending colon and  in the transverse colon, removed with a cold snare.                            Resected and retrieved.                           - Tumor in the cecum. Biopsied. Multiple additional                            large cecal polyps?"not removed. Tattoo placed                            downstream.                           - Diverticulosis in the entire examined colon.                           - The examination was otherwise normal on direct                            and retroflexion views. Recommendation:           1. Patient has a contact number available for                            emergencies. The signs and symptoms of potential                            delayed complications were discussed with the                            patient. Return to normal activities tomorrow.                            Written discharge instructions were provided to the                            patient.                           2. Resume previous diet                           3. Follow-up pathology                           4. Refer to [facility] surgery for cecal                            mass, right hemicolectomy                           5. Schedule contrast CT scan of the abdomen pelvis                            cecal mass. Norleen  GEANNIE Kiang, MD 01/22/2024 3:16:21 PM This report has been signed electronically.

## 2024-01-22 NOTE — Progress Notes (Signed)
 Expand All Collapse All        12/14/2023 Caleb [name] [id number] 01/21/1961     CHIEF COMPLAINT: Schedule colonoscopy, low hemoglobin level   HISTORY OF PRESENT ILLNESS: Caleb [name] is a 63 year old male with a past medical history of hypertension, atrial flutter s/p ablation x 2 not on AC, diabetes mellitus type II, gout and colon polyps.  He presents to our office today as referred by Alfonso Goltz, NP for further evaluation regarding anemia and to schedule colonoscopy.  He completed a Hemosure stool test which resulted + 09/2023.  He underwent laboratory studies 09/2023 which showed mild anemia, his most recent lab results are not available in epic or Care Everywhere for review. Prior labs 09/06/2022 showed a Hemoglobin level of 14.9.  He denies having any nausea or vomiting.  No heartburn or dysphagia.  He typically passes a normal formed brown bowel movement daily.  He infrequently sees a small amount of bright red blood on the toilet tissue.  He denies having any constipation or straining.  He underwent a colonoscopy 11/09/2000 by Dr. Cloretta which identified one 5 mm tubular adenomatous polyp removed from the sigmoid colon, erythema present to the descending colon and internal/external hemorrhoids.  Colon biopsies were negative for colitis/IBD.  He denies having any further colonoscopies since then.  No known family history of colon polyps or colorectal cancer.   He has a history of a flutter status post ablation x 2, no longer on anticoagulation and remains on Flecainide .  He last saw his cardiologist Dr. Waddell 12/2022 and his cardiac status was stable at that time.  He is due for routine annual cardiology follow-up at this time.  He denies having any chest pain, palpitations or shortness of breath.  He has a heart murmur on exam which he stated was not new.  ECHO 10/2022 showed LVEF 60 to 65% which showed severe thickening to the aortic valve with moderate aortic regurgitation and stenosis.   See full echo report below.   ECHO 11/04/2022:  Left ventricular ejection fraction, by estimation, is 60 to 65%. The left ventricle has normal function. The left ventricle has no regional wall motion abnormalities. Left ventricular diastolic parameters were normal. The average left ventricular global longitudinal strain is -16.2 %. The global longitudinal strain is normal. 1. 2. Right ventricular systolic function is normal. The right ventricular size is normal. 3. Left atrial size was mildly dilated. The mitral valve is abnormal. No evidence of mitral valve regurgitation. No evidence of mitral stenosis. 4. Cannot tell if valve is bicuspid or tri leaflet . The aortic valve is normal in structure. There is severe calcifcation of the aortic valve. There is severe thickening of the aortic valve. Aortic valve regurgitation is moderate. Moderate aortic valve stenosis. 5. The inferior vena cava is normal in size with greater than 50% respiratory variability, suggesting right atrial pressure of 3 mmHg.       Past Medical History:  Diagnosis Date   Diabetes mellitus without complication (HCC)      Type II   Dysrhythmia      aFlutter   Gout     HLD (hyperlipidemia)     Hypertension               Past Surgical History:  Procedure Laterality Date   A-FLUTTER ABLATION N/A 08/02/2021    Procedure: A-FLUTTER ABLATION;  Surgeon: Waddell Danelle ORN, MD;  Location: [facility] INVASIVE CV LAB;  Service: Cardiovascular;  Laterality: N/A;   A-FLUTTER  ABLATION N/A 09/29/2021    Procedure: A-FLUTTER ABLATION;  Surgeon: Waddell Danelle ORN, MD;  Location: [facility] INVASIVE CV LAB;  Service: Cardiovascular;  Laterality: N/A;   ANTERIOR CRUCIATE LIGAMENT REPAIR Right      x 2   LUMBAR FUSION        L3-L4   ROTATOR CUFF REPAIR Right          Social History: He has 2 sons.  Retired.  He quit smoking cigarettes 15 years ago.  No alcohol use.  No drug use.   Family History: family history includes Atrial fibrillation in his mother;  Dementia in his maternal grandmother; Healthy in his sister; Lung cancer in his father; Other in his maternal grandfather; Pneumonia in his paternal grandmother; Stroke in his paternal grandfather.  No known family history of esophageal, gastric or colon cancer.   Allergies  No Known Allergies           Outpatient Encounter Medications as of 12/14/2023  Medication Sig   atorvastatin (LIPITOR) 20 MG tablet Take 20 mg by mouth daily.   Blood Glucose Monitoring Suppl (TRUE METRIX AIR GLUCOSE METER) DEVI Check CBG BID   colchicine 0.6 MG tablet Take 0.6 mg by mouth as needed (gout).   flecainide  (TAMBOCOR ) 100 MG tablet TAKE 1 TABLET BY MOUTH EVERY 12 HOURS   metFORMIN  (GLUCOPHAGE ) 500 MG tablet Take 1 mg by mouth 3 (three) times daily. Take 2 tablets in the am and 1 tablet in the pm   metoprolol  succinate (TOPROL -XL) 50 MG 24 hr tablet Take 1 tablet by mouth twice daily   sildenafil  (REVATIO ) 20 MG tablet Take 20 mg by mouth as needed.   [DISCONTINUED] amoxicillin -clavulanate (AUGMENTIN ) 875-125 MG tablet Take 1 tablet by mouth every 12 (twelve) hours.   [DISCONTINUED] diltiazem  (CARDIZEM ) 30 MG tablet Take 1 tablet (30 mg total) by mouth as needed.      No facility-administered encounter medications on file as of 12/14/2023.      REVIEW OF SYSTEMS:  Gen: Denies fever, sweats or chills. No weight loss.  CV: Denies chest pain, palpitations or edema. Resp: Denies cough, shortness of breath of hemoptysis.  GI See HPI.  GU: Denies urinary burning, blood in urine, increased urinary frequency or incontinence. MS: Denies joint pain, muscles aches or weakness. Derm: Denies rash, itchiness, skin lesions or unhealing ulcers. Psych: Denies depression, anxiety, memory loss or confusion. Heme: Denies bruising, easy bleeding. Neuro:  Denies headaches, dizziness or paresthesias. Endo:  + Diabetes.   PHYSICAL EXAM: BP 124/70 (BP Location: Left Arm, Patient Position: Sitting, Cuff Size: Large)   Pulse  84   Ht 5' 11.25 (1.81 m) Comment: height measured without shoes  Wt 262 lb 2 oz (118.9 kg)   BMI 36.30 kg/m  General: 63 year old male in no acute distress. Head: Normocephalic and atraumatic. Eyes:  Sclerae non-icteric, conjunctive pink. Ears: Normal auditory acuity. Mouth: Dentition intact. No ulcers or lesions.  Neck: Supple, no lymphadenopathy or thyromegaly.  Lungs: Clear bilaterally to auscultation without wheezes, crackles or rhonchi. Heart: Regular rate and rhythm. II/VI systolic murmur, rub or gallop appreciated.  Abdomen: Soft, nontender, nondistended. No masses. No hepatosplenomegaly. Normoactive bowel sounds x 4 quadrants.  Rectal: Deferred Musculoskeletal: Symmetrical with no gross deformities. Skin: Warm and dry. No rash or lesions on visible extremities. Extremities: No edema. Neurological: Alert oriented x 4, no focal deficits.  Psychological: Alert and cooperative. Normal mood and affect.   ASSESSMENT AND PLAN:   63 year old male with a history  of tubular adenomatous polyp for colonoscopy 11/2000 with a positive Hemosure 09/2023.  Patient infrequently sees a small amount of bright red blood on the toilet tissue.  No known family history of colorectal cancer. -Colonoscopy benefits and risks discussed including risk with sedation, risk of bleeding, perforation and infection   Mild anemia per labs done by PCP 09/2023 - Request copy of CBC completed 09/2023 per PCP - CBC, IBC + ferritin panel today  - If patient has iron deficiency anemia he will require and EGD and colonoscopy  - EGD and colonoscopy benefits and risks discussed including risk with sedation, risk of bleeding, perforation and infection   History of atrial flutter status post ablation x 2 no longer on AC and remains on flecainide    Heart murmur.  Echo 11/04/2022 showed LVEF 60 to 65%, severe thickening to the aortic valve with moderate aortic regurgitation and stenosis.  Patient is asymptomatic, no angina or  DOE. - Patient instructed to schedule follow-up appointment with cardiology, last routine follow-up was 12/2022   ADDENDUM:    Labs 12/14/2023 confirmed IDA. Iron 32. Ferritin 14.6. Saturation ratios 6.1.  Hg 12.1. Repeat Hg 12.6 on 12/29/2023.   Patient was seen by his cardiologist Dr. Danelle Birmingham 12/23/2023, cardiac status was stable at that time. No longer on Eliquis . Remains on Flecainide , Metoprolol  and Diltiazem .    Patient to proceed with EGD and colonoscopy with Dr. Abran 01/22/2024 as scheduled.      CC:  Barbra Odor, NP      Recent GI evaluation as above.  No interval change in history or physical.  Now for colonoscopy and upper endoscopy to evaluate iron deficiency anemia and Hemoccult positive stool.

## 2024-01-22 NOTE — Progress Notes (Signed)
 To pacu, VSS. Report to [email or web address].tb

## 2024-01-23 ENCOUNTER — Telehealth: Payer: Self-pay | Admitting: *Deleted

## 2024-01-23 ENCOUNTER — Other Ambulatory Visit: Payer: Self-pay

## 2024-01-23 DIAGNOSIS — K6389 Other specified diseases of intestine: Secondary | ICD-10-CM

## 2024-01-23 LAB — SURGICAL PATHOLOGY

## 2024-01-23 NOTE — Telephone Encounter (Signed)
 No answer on  follow up call. Left message.

## 2024-01-24 ENCOUNTER — Ambulatory Visit: Payer: Self-pay | Admitting: Internal Medicine

## 2024-01-24 ENCOUNTER — Other Ambulatory Visit: Payer: Self-pay

## 2024-01-24 DIAGNOSIS — K6389 Other specified diseases of intestine: Secondary | ICD-10-CM

## 2024-01-24 DIAGNOSIS — D500 Iron deficiency anemia secondary to blood loss (chronic): Secondary | ICD-10-CM

## 2024-01-24 DIAGNOSIS — D649 Anemia, unspecified: Secondary | ICD-10-CM

## 2024-01-25 ENCOUNTER — Telehealth: Payer: Self-pay

## 2024-01-25 NOTE — Telephone Encounter (Signed)
 Faxed referral to [facility] for cecal mass

## 2024-01-25 NOTE — Telephone Encounter (Signed)
 Referral was faxed to [facility] by Sonny Seltzer CMA

## 2024-01-27 ENCOUNTER — Encounter: Payer: Self-pay | Admitting: Internal Medicine

## 2024-01-29 ENCOUNTER — Other Ambulatory Visit: Payer: Self-pay | Admitting: Internal Medicine

## 2024-01-29 ENCOUNTER — Ambulatory Visit (HOSPITAL_COMMUNITY)
Admission: RE | Admit: 2024-01-29 | Discharge: 2024-01-29 | Disposition: A | Discharge status: Home / Self Care | Source: Ambulatory Visit | Attending: Internal Medicine | Admitting: Internal Medicine

## 2024-01-29 DIAGNOSIS — K6389 Other specified diseases of intestine: Secondary | ICD-10-CM | POA: Diagnosis present

## 2024-01-29 LAB — POCT I-STAT CREATININE: Creatinine, Ser: 1 mg/dL (ref 0.61–1.24)

## 2024-01-29 MED ORDER — IOHEXOL 300 MG/ML  SOLN
100.0000 mL | Freq: Once | INTRAMUSCULAR | Status: AC | PRN
  Administered 2024-01-29: 100 mL via INTRAVENOUS

## 2024-01-29 MED ORDER — IOHEXOL 9 MG/ML PO SOLN
500.0000 mL | ORAL | Status: AC
  Administered 2024-01-29 (×2): 500 mL via ORAL

## 2024-01-31 ENCOUNTER — Ambulatory Visit: Payer: Self-pay | Admitting: Internal Medicine

## 2024-02-02 ENCOUNTER — Other Ambulatory Visit (INDEPENDENT_AMBULATORY_CARE_PROVIDER_SITE_OTHER)

## 2024-02-02 DIAGNOSIS — K6389 Other specified diseases of intestine: Secondary | ICD-10-CM

## 2024-02-02 DIAGNOSIS — D500 Iron deficiency anemia secondary to blood loss (chronic): Secondary | ICD-10-CM

## 2024-02-02 DIAGNOSIS — D649 Anemia, unspecified: Secondary | ICD-10-CM

## 2024-02-02 LAB — COMPREHENSIVE METABOLIC PANEL WITH GFR
ALT: 21 U/L (ref 0–53)
AST: 24 U/L (ref 0–37)
Albumin: 4.1 g/dL (ref 3.5–5.2)
Alkaline Phosphatase: 81 U/L (ref 39–117)
BUN: 6 mg/dL (ref 6–23)
CO2: 26 meq/L (ref 19–32)
Calcium: 8.8 mg/dL (ref 8.4–10.5)
Chloride: 106 meq/L (ref 96–112)
Creatinine, Ser: 0.8 mg/dL (ref 0.40–1.50)
GFR: 94.2 mL/min (ref >60.00)
Glucose, Bld: 117 mg/dL — ABNORMAL HIGH (ref 70–99)
Potassium: 4.1 meq/L (ref 3.5–5.1)
Sodium: 142 meq/L (ref 135–145)
Total Bilirubin: 0.5 mg/dL (ref 0.2–1.2)
Total Protein: 7.4 g/dL (ref 6.0–8.3)

## 2024-02-02 LAB — PROTIME-INR
INR: 1.1 ratio — ABNORMAL HIGH (ref 0.8–1.0)
Prothrombin Time: 11.6 s (ref 9.6–13.1)

## 2024-02-02 LAB — CBC WITH DIFFERENTIAL/PLATELET
Basophils Absolute: 0.1 K/uL (ref 0.0–0.1)
Basophils Relative: 1.1 % (ref 0.0–3.0)
Eosinophils Absolute: 0.1 K/uL (ref 0.0–0.7)
Eosinophils Relative: 1.7 % (ref 0.0–5.0)
HCT: 42.2 % (ref 39.0–52.0)
Hemoglobin: 13.5 g/dL (ref 13.0–17.0)
Lymphocytes Relative: 24.6 % (ref 12.0–46.0)
Lymphs Abs: 1.1 K/uL (ref 0.7–4.0)
MCHC: 32.1 g/dL (ref 30.0–36.0)
MCV: 82.4 fl (ref 78.0–100.0)
Monocytes Absolute: 0.4 K/uL (ref 0.1–1.0)
Monocytes Relative: 9.9 % (ref 3.0–12.0)
Neutro Abs: 2.8 K/uL (ref 1.4–7.7)
Neutrophils Relative %: 62.7 % (ref 43.0–77.0)
Platelets: 199 K/uL (ref 150.0–400.0)
RBC: 5.12 Mil/uL (ref 4.22–5.81)
RDW: 22.4 % — ABNORMAL HIGH (ref 11.5–15.5)
WBC: 4.5 K/uL (ref 4.0–10.5)

## 2024-02-05 ENCOUNTER — Ambulatory Visit: Payer: Self-pay | Admitting: General Surgery

## 2024-02-05 ENCOUNTER — Ambulatory Visit: Payer: Self-pay | Admitting: Internal Medicine

## 2024-02-05 ENCOUNTER — Telehealth: Payer: Self-pay | Admitting: *Deleted

## 2024-02-05 LAB — CEA: CEA: 2.2 ng/mL

## 2024-02-05 LAB — AFP TUMOR MARKER: AFP-Tumor Marker: 2.6 ng/mL (ref <6.1)

## 2024-02-05 NOTE — H&P (Signed)
 REFERRING PHYSICIAN:  Abran Norleen LOISE Mickey., MD  PROVIDER:  BERNARDA WANDA NED, MD  MRN: [id number] DOB: 1961/05/20 DATE OF ENCOUNTER: 02/05/2024  Subjective  Chief Complaint: New Consultation     History of Present Illness: Caleb [name] is a 63 y.o. male who is seen today as an office consultation at the request of Dr. Abran for evaluation of New Consultation .  Patient underwent a colonoscopy due to heme positive stool and anemia on 01/22/2024.  Several polyps were removed from the rectum descending colon and transverse colon.  In the cecum there was a 4 to 5 cm polypoid mass which was biopsied.  There were also 4 pedunculated and semipedunculated polyps in the cecum.  Area was tattooed distally.  Biopsy show tubulovillous adenoma without high-grade dysplasia or carcinoma.  Remaining polyps also showed tubular adenoma without high-grade dysplasia or malignancy.  He has a history of cardiac ablation for atrial flutter and past surgical history significant for umbilical hernia repair.   Review of Systems: A complete review of systems was obtained from the patient.  I have reviewed this information and discussed as appropriate with the patient.  See HPI as well for other ROS.   Medical History: Past Medical History: Diagnosis Date  Anemia   Hyperlipidemia   Hypertension    There is no problem list on file for this patient.   Past Surgical History: Procedure Laterality Date  knee surgery Right 1982    No Known Allergies  Current Outpatient Medications on File Prior to Visit Medication Sig Dispense Refill  atorvastatin (LIPITOR) 20 MG tablet Take 20 mg by mouth once daily    ferrous sulfate  325 (65 FE) MG tablet Take 325 mg by mouth daily with breakfast    flecainide  (TAMBOCOR ) 100 MG tablet Take 100 mg by mouth every 12 (twelve) hours    metFORMIN  (GLUCOPHAGE ) 500 MG tablet Take 1 mg by mouth    metoprolol  SUCCinate (TOPROL -XL) 50 MG XL tablet Take 50 mg by mouth 2  (two) times daily    No current facility-administered medications on file prior to visit.   Family History Problem Relation Age of Onset  High blood pressure (Hypertension) Father     Social History  Tobacco Use Smoking Status Former  Current packs/day: 0.00  Types: Cigarettes  Quit date: 2011  Years since quitting: 14.5 Smokeless Tobacco Never    Social History  Socioeconomic History  Marital status: Legally Separated Tobacco Use  Smoking status: Former   Current packs/day: 0.00   Types: Cigarettes   Quit date: 2011   Years since quitting: 14.5  Smokeless tobacco: Never Substance and Sexual Activity  Drug use: Never  Social Drivers of Health  Housing Stability: Unknown (02/05/2024)  Housing Stability Vital Sign   Homeless in the Last Year: No   Objective:   Vitals:  02/05/24 1101 02/05/24 1112 BP:  136/80 Pulse:  78 Temp:  36.9 C (98.5 F) SpO2:  97% Weight:  (!) 118.5 kg (261 lb 3.2 oz) Height:  182.9 cm (6') PainSc: 0-No pain     Exam Gen: NAD Abd: soft, umbilical scar   Assessment and Plan: Diagnoses and all orders for this visit:  Mass of colon -     polyethylene glycol (MIRALAX ) powder; Take 233.75 g by mouth once for 1 dose Take according to your procedure prep instructions. -     bisacodyL (DULCOLAX) 5 mg EC tablet; Take 4 tablets (20 mg total) by mouth once daily as needed for Constipation  for up to 1 dose -     metroNIDAZOLE  (FLAGYL ) 500 MG tablet; Take 2 tablets (1,000 mg total) by mouth 3 (three) times daily for 3 doses Take according to your procedure colon prep instructions -     neomycin 500 mg tablet; Take 2 tablets (1,000 mg total) by mouth 3 (three) times daily for 3 doses Take according to your procedure colon prep instructions  63 year old male with recent colonoscopy due to anemia.  He presents with a mass in the ascending colon as well is several other polyps.  Biopsy did not show any malignancy, but given the size of the  mass, I recommended proceeding with a robotic assisted right colectomy.  We discussed that masses of the size can harbor  malignancies approximately 20% of the time.  We also discussed alternative treatments.  He has elected to proceed with surgical resection. The surgery and anatomy were described to the patient as well as the risks of surgery and the possible complications.  These include: Bleeding, deep abdominal infections and possible wound complications such as hernia and infection, damage to adjacent structures, leak of surgical connections, which can lead to other surgeries and possibly an ostomy, possible need for other procedures, such as abscess drains in radiology, possible prolonged hospital stay, possible diarrhea from removal of part of the colon, possible constipation from narcotics, possible bowel, bladder or sexual dysfunction if having rectal surgery, prolonged fatigue/weakness or appetite loss, possible early recurrence of of disease, possible complications of their medical problems such as heart disease or arrhythmias or lung problems, death (less than 1%). I believe the patient understands and wishes to proceed with the surgery.   Bernarda JAYSON Ned, MD Colon and Rectal Surgery [facility]

## 2024-02-05 NOTE — Telephone Encounter (Signed)
   Pre-operative Risk Assessment    Patient Name: Caleb [name]  DOB: 08-04-1960 MRN: [id number]   Date of last office visit: 12/23/22 DR. WADDELL Date of next office visit: NONE   Request for Surgical Clearance    Procedure:  COLON SURGERY (PER FORM)  I HAVE REVIEWED NOTES IN EPIC AND FOUND COMPLETE PROCEDURE: COLECTOMY, SIGMOID, ROBOT-ASSISTED   Date of Surgery:  Clearance TBD (PER FORM); FOUND DATE OF SURGERY IN EPIC AS 02/16/24                               Surgeon:  DR. BERNARDA NED Surgeon's Group or Practice Name:  [facility] Phone number:  [phone] Fax number:  [phone] KELLY DOCKERY, LPN   Type of Clearance Requested:   - Medical ; NONE INDICATED PER FORM   Type of Anesthesia:  General    Additional requests/questions:    Bonney Niels Jest   02/05/2024, 1:51 PM

## 2024-02-07 NOTE — Telephone Encounter (Signed)
   Name: Caleb [name]  DOB: 28-Oct-1960  MRN: [id number]  Primary Cardiologist: Annabella Scarce, MD  Chart reviewed as part of pre-operative protocol coverage. Because of Caleb [name]'s past medical history and time since last visit, he will require a follow-up in-office visit in order to better assess preoperative cardiovascular risk. Last seen by Dr. Waddell on 12/23/2022. Surgery is scheduled for 03/01/2024. Does not have appt scheduled for followup.   Pre-op covering staff: - Please schedule appointment and call patient to inform them. If patient already had an upcoming appointment within acceptable timeframe, please add pre-op clearance to the appointment notes so provider is aware. - Please contact requesting surgeon's office via preferred method (i.e, phone, fax) to inform them of need for appointment prior to surgery.    Caleb Satterfield, NP  02/07/2024, 8:53 AM

## 2024-02-07 NOTE — Telephone Encounter (Signed)
 Patient not on anticoagulation ?

## 2024-02-12 ENCOUNTER — Encounter (HOSPITAL_COMMUNITY)

## 2024-02-17 NOTE — Progress Notes (Signed)
 COVID Vaccine received:  [x]  No []  Yes Date of any COVID positive Test in last 90 days:  none  PCP - Alfonso Goltz, NP  at [facility]  [phone] Cardiologist - Annabella Scarce, MD  EP- Cathlyn Birmingham, MD (LOV 12-23-2022)  Chest x-ray - 08-16-2021  1v  Epic EKG -  12-23-2022  Epic Stress Test -  ECHO - 11-04-2022  Epic Cardiac Cath -  CT Coronary Calcium score:   Bowel Prep - []  No  [x]   Yes _[facility] Prep  Pacemaker / ICD device [x]  No []  Yes   Spinal Cord Stimulator:[x]  No []  Yes       History of Sleep Apnea? [x]  No []  Yes   CPAP used?- [x]  No []  Yes    Patient has: []  NO Hx DM   []  Pre-DM   []  DM1  [x]   DM2 Does the patient monitor blood sugar?   []  N/A   []  No [x]  Yes  Last A1c was: 5.8 on  08-28-2020    Does patient have a [employer] or Dexcom? [x]  No []  Yes   Fasting Blood Sugar Ranges-  Checks Blood Sugar _1 times a week  Blood Thinner / Instructions:  none since last ablation Aspirin Instructions:  none  ERAS Protocol Ordered: []  No  [x]  Yes PRE-SURGERY []  ENSURE  [x]  G2 x3   Patient is to be NPO after: 0430  Dental hx: [x]  Dentures: Upper set only but doesn't wear them  []  N/A      []  Bridge or Partial:                   []  Loose or Damaged teeth:   Comments: No ostomy markings were requested on this patient (verified with Fronie Mulch RN) No request in either Special needs, booking or email.    Activity level: Able to walk up 2 flights of stairs without becoming significantly short of breath or having chest pain?  []  No   [x]    Yes  Patient can perform ADLs without assistance. []  No   [x]   Yes  Anesthesia review: A.flutter- last ablation 09-29-2021 HTN, Anemia, DM2, liver cirrhosis  Patient denies any S&S of respiratory illness or Covid - no shortness of breath, fever, cough or chest pain at PAT appointment.  Patient verbalized understanding and agreement to the Pre-Surgical Instructions that were given to them at this PAT appointment. Patient was also  educated of the need to review these PAT instructions again prior to his surgery.I reviewed the appropriate phone numbers to call if they have any and questions or concerns.

## 2024-02-17 NOTE — Patient Instructions (Addendum)
 SURGICAL WAITING ROOM VISITATION Patients having surgery or a procedure may have no more than 2 support people in the waiting area - these visitors may rotate in the visitor waiting room.   If the patient needs to stay at the hospital during part of their recovery, the visitor guidelines for inpatient rooms apply.  PRE-OP VISITATION  Pre-op nurse will coordinate an appropriate time for 1 support person to accompany the patient in pre-op.  This support person may not rotate.  This visitor will be contacted when the time is appropriate for the visitor to come back in the pre-op area.  Please refer to the [facility] website for the visitor guidelines for Inpatients (after your surgery is over and you are in a regular room).  You are not required to quarantine at this time prior to your surgery. However, you must do this: Hand Hygiene often Do NOT share personal items Notify your provider if you are in close contact with someone who has COVID or you develop fever 100.4 or greater, new onset of sneezing, cough, sore throat, shortness of breath or body aches.  If you test positive for Covid or have been in contact with anyone that has tested positive in the last 10 days please notify you surgeon.    Your procedure is scheduled on:  Friday  March 01, 2024  Report to [facility] Main Entrance: [place] entrance where the [employer] is available.   Report to admitting at: 05:15 AM  Call this number if you have any questions or problems the morning of surgery [phone]  FOLLOW ANY ADDITIONAL PRE OP INSTRUCTIONS YOU RECEIVED FROM YOUR SURGEON'S OFFICE!!!  Dulcolax 20 mg (total) - Take 4 (four) of the 5 mg Dulcolax tablets with water at 07:00 am the day prior to surgery.  Miralax  255 g - Mix with 64 oz Gatorade/Powerade.  Starting at 10:00 am ,Drink this gradually over the next few hours (8 oz glass every 15-30 minutes) until gone the day prior to surgery You should finish in 4  hours-6 hours.    Neomycin 1000 mg - At 2 pm, 3 pm and 10 pm after Miralax   bowel prep the day prior to surgery.  Metronidazole  1000 mg - At 2 pm, 3 pm and 10 pm after Miralax  bowel prep the day prior to surgery.   Drink plenty of clear liquids all evening to avoid getting dehydrated.   DRINK two (2) bottles of Pre-Surgery G2 drink starting at 6:00 pm the evening prior to your surgery to help prevent dehydration. Increase drinking clear fluids (see list below)          Do not eat food after Midnight the night prior to your surgery/procedure.  After Midnight you may have the following liquids until  04:30  AM DAY OF SURGERY  Clear Liquid Diet Water Black Coffee (sugar ok, NO MILK/CREAM OR CREAMERS)  Tea (sugar ok, NO MILK/CREAM OR CREAMERS) regular and decaf                             Plain Jell-O  with no fruit (NO RED)                                           Fruit ices (not with fruit pulp, NO RED)  Popsicles (NO RED)                                                                  Juice: NO CITRUS JUICES: only apple, WHITE grape, WHITE cranberry Sports drinks like Gatorade or Powerade (NO RED)                   The day of surgery:  Drink ONE (1) Pre-Surgery G2 at   04:30 AM the morning of surgery. Drink in one sitting. Do not sip.  This drink was given to you during your hospital pre-op appointment visit. Nothing else to drink after completing the Pre-Surgery G2 : No candy, chewing gum or throat lozenges.     Oral Hygiene is also important to reduce your risk of infection.        Remember - BRUSH YOUR TEETH THE MORNING OF SURGERY WITH YOUR REGULAR TOOTHPASTE  Do NOT smoke after Midnight the night before surgery.  STOP TAKING all Vitamins, Herbs and supplements 1 week before your surgery.   METFORMIN - Day BEFORE surgery, take as usual.         DAY OF SURGERY- DO NOT TAKE METFORMIN .  Take ONLY these medicines the morning of surgery with  A SIP OF WATER: Metoprolol , flecainide .                     You may not have any metal on your body including  jewelry, and body piercing  Do not wear  lotions, powders, cologne, or deodorant  Men may shave face and neck.  Contacts, Hearing Aids, dentures or bridgework may not be worn into surgery. DENTURES WILL BE REMOVED PRIOR TO SURGERY PLEASE DO NOT APPLY Poly grip OR ADHESIVES!!!  You may bring a small overnight bag with you on the day of surgery, only pack items that are not valuable. [place] IS NOT RESPONSIBLE   FOR VALUABLES THAT ARE LOST OR STOLEN.   Do not bring your home medications to the hospital. The Pharmacy will dispense medications listed on your medication list to you during your admission in the Hospital.  Special Instructions: Bring a copy of your healthcare power of attorney and living will documents the day of surgery, if you wish to have them scanned into your [facility] Records- EPIC  Please read over the following fact sheets you were given: IF YOU HAVE QUESTIONS ABOUT YOUR PRE-OP INSTRUCTIONS, PLEASE CALL [phone]   [facility] - Preparing for Surgery Before surgery, you can play an important role.  Because skin is not sterile, your skin needs to be as free of germs as possible.  You can reduce the number of germs on your skin by washing with CHG (chlorahexidine gluconate) soap before surgery.  CHG is an antiseptic cleaner which kills germs and bonds with the skin to continue killing germs even after washing. Please DO NOT use if you have an allergy to CHG or antibacterial soaps.  If your skin becomes reddened/irritated stop using the CHG and inform your nurse when you arrive at Short Stay. Do not shave (including legs and underarms) for at least 48 hours prior to the first CHG shower.  You may shave your face/neck.  Please follow these instructions carefully:  1.  Shower with CHG Soap the night before surgery and the  morning of surgery.  2.  If  you choose to wash your hair, wash your hair first as usual with your normal  shampoo.  3.  After you shampoo, rinse your hair and body thoroughly to remove the shampoo.                             4.  Use CHG as you would any other liquid soap.  You can apply chg directly to the skin and wash.  Gently with a scrungie or clean washcloth.  5.  Apply the CHG Soap to your body ONLY FROM THE NECK DOWN.   Do not use on face/ open                           Wound or open sores. Avoid contact with eyes, ears mouth and genitals (private parts).                       Wash face,  Genitals (private parts) with your normal soap.             6.  Wash thoroughly, paying special attention to the area where your  surgery  will be performed.  7.  Thoroughly rinse your body with warm water from the neck down.  8.  DO NOT shower/wash with your normal soap after using and rinsing off the CHG Soap.            9.  Pat yourself dry with a clean towel.            10.  Wear clean pajamas.            11.  Place clean sheets on your bed the night of your first shower and do not  sleep with pets.  ON THE DAY OF SURGERY : Do not apply any lotions/deodorants the morning of surgery.  Please wear clean clothes to the hospital/surgery center.     FAILURE TO FOLLOW THESE INSTRUCTIONS MAY RESULT IN THE CANCELLATION OF YOUR SURGERY  PATIENT SIGNATURE_________________________________  NURSE SIGNATURE__________________________________  ________________________________________________________________________        Nasario Exon    An incentive spirometer is a tool that can help keep your lungs clear and active. This tool measures how well you are filling your lungs with each breath. Taking long deep breaths may help reverse or decrease the chance of developing breathing (pulmonary) problems (especially infection) following: A long period of time when you are unable to move or be active. BEFORE THE PROCEDURE   If the spirometer includes an indicator to show your best effort, your nurse or respiratory therapist will set it to a desired goal. If possible, sit up straight or lean slightly forward. Try not to slouch. Hold the incentive spirometer in an upright position. INSTRUCTIONS FOR USE  Sit on the edge of your bed if possible, or sit up as far as you can in bed or on a chair. Hold the incentive spirometer in an upright position. Breathe out normally. Place the mouthpiece in your mouth and seal your lips tightly around it. Breathe in slowly and as deeply as possible, raising the piston or the ball toward the top of the column. Hold your breath for 3-5 seconds or for as long as possible. Allow the piston or ball to fall to the bottom  of the column. Remove the mouthpiece from your mouth and breathe out normally. Rest for a few seconds and repeat Steps 1 through 7 at least 10 times every 1-2 hours when you are awake. Take your time and take a few normal breaths between deep breaths. The spirometer may include an indicator to show your best effort. Use the indicator as a goal to work toward during each repetition. After each set of 10 deep breaths, practice coughing to be sure your lungs are clear. If you have an incision (the cut made at the time of surgery), support your incision when coughing by placing a pillow or rolled up towels firmly against it. Once you are able to get out of bed, walk around indoors and cough well. You may stop using the incentive spirometer when instructed by your caregiver.  RISKS AND COMPLICATIONS Take your time so you do not get dizzy or light-headed. If you are in pain, you may need to take or ask for pain medication before doing incentive spirometry. It is harder to take a deep breath if you are having pain. AFTER USE Rest and breathe slowly and easily. It can be helpful to keep track of a log of your progress. Your caregiver can provide you with a simple table to help  with this. If you are using the spirometer at home, follow these instructions: SEEK MEDICAL CARE IF:  You are having difficultly using the spirometer. You have trouble using the spirometer as often as instructed. Your pain medication is not giving enough relief while using the spirometer. You develop fever of 100.5 F (38.1 C) or higher.                                                                                                    SEEK IMMEDIATE MEDICAL CARE IF:  You cough up bloody sputum that had not been present before. You develop fever of 102 F (38.9 C) or greater. You develop worsening pain at or near the incision site. MAKE SURE YOU:  Understand these instructions. Will watch your condition. Will get help right away if you are not doing well or get worse. Document Released: 11/07/2006 Document Revised: 09/19/2011 Document Reviewed: 01/08/2007 [facility] Patient Information 2014 [place], [place].

## 2024-02-18 ENCOUNTER — Other Ambulatory Visit: Payer: Self-pay | Admitting: Nurse Practitioner

## 2024-02-19 ENCOUNTER — Encounter (HOSPITAL_COMMUNITY): Payer: Self-pay

## 2024-02-19 ENCOUNTER — Encounter (HOSPITAL_COMMUNITY): Payer: Self-pay | Admitting: Medical

## 2024-02-19 ENCOUNTER — Other Ambulatory Visit: Payer: Self-pay

## 2024-02-19 ENCOUNTER — Encounter (HOSPITAL_COMMUNITY)
Admission: RE | Admit: 2024-02-19 | Discharge: 2024-02-19 | Disposition: A | Discharge status: Home / Self Care | Source: Ambulatory Visit | Attending: General Surgery | Admitting: General Surgery

## 2024-02-19 VITALS — BP 154/78 | HR 72 | Temp 98.4°F | Resp 20 | Ht 72.0 in | Wt 259.0 lb

## 2024-02-19 DIAGNOSIS — I483 Typical atrial flutter: Secondary | ICD-10-CM | POA: Diagnosis not present

## 2024-02-19 DIAGNOSIS — E119 Type 2 diabetes mellitus without complications: Secondary | ICD-10-CM | POA: Diagnosis not present

## 2024-02-19 DIAGNOSIS — Z01818 Encounter for other preprocedural examination: Secondary | ICD-10-CM | POA: Insufficient documentation

## 2024-02-19 DIAGNOSIS — K769 Liver disease, unspecified: Secondary | ICD-10-CM | POA: Diagnosis not present

## 2024-02-19 DIAGNOSIS — I10 Essential (primary) hypertension: Secondary | ICD-10-CM | POA: Insufficient documentation

## 2024-02-19 HISTORY — DX: Unspecified cirrhosis of liver: K74.60

## 2024-02-19 HISTORY — DX: Gastro-esophageal reflux disease without esophagitis: K21.9

## 2024-02-19 HISTORY — DX: Unspecified osteoarthritis, unspecified site: M19.90

## 2024-02-19 LAB — CBC
HCT: 47.5 % (ref 39.0–52.0)
Hemoglobin: 14.7 g/dL (ref 13.0–17.0)
MCH: 27.1 pg (ref 26.0–34.0)
MCHC: 30.9 g/dL (ref 30.0–36.0)
MCV: 87.6 fL (ref 80.0–100.0)
Platelets: 169 K/uL (ref 150–400)
RBC: 5.42 MIL/uL (ref 4.22–5.81)
RDW: 20.7 % — ABNORMAL HIGH (ref 11.5–15.5)
WBC: 4.4 K/uL (ref 4.0–10.5)
nRBC: 0 % (ref 0.0–0.2)

## 2024-02-19 LAB — COMPREHENSIVE METABOLIC PANEL WITH GFR
ALT: 34 U/L (ref 0–44)
AST: 46 U/L — ABNORMAL HIGH (ref 15–41)
Albumin: 3.9 g/dL (ref 3.5–5.0)
Alkaline Phosphatase: 73 U/L (ref 38–126)
Anion gap: 11 (ref 5–15)
BUN: 7 mg/dL — ABNORMAL LOW (ref 8–23)
CO2: 25 mmol/L (ref 22–32)
Calcium: 8.9 mg/dL (ref 8.9–10.3)
Chloride: 103 mmol/L (ref 98–111)
Creatinine, Ser: 0.87 mg/dL (ref 0.61–1.24)
GFR, Estimated: >60 mL/min (ref >60)
Glucose, Bld: 131 mg/dL — ABNORMAL HIGH (ref 70–99)
Potassium: 4.8 mmol/L (ref 3.5–5.1)
Sodium: 139 mmol/L (ref 135–145)
Total Bilirubin: 0.9 mg/dL (ref 0.0–1.2)
Total Protein: 7.7 g/dL (ref 6.5–8.1)

## 2024-02-19 LAB — GLUCOSE, CAPILLARY: Glucose-Capillary: 144 mg/dL — ABNORMAL HIGH (ref 70–99)

## 2024-02-19 LAB — TYPE AND SCREEN
ABO/RH(D): O POS
Antibody Screen: NEGATIVE

## 2024-02-20 LAB — HEMOGLOBIN A1C
Hgb A1c MFr Bld: 6.3 % — ABNORMAL HIGH (ref 4.8–5.6)
Mean Plasma Glucose: 134 mg/dL

## 2024-02-26 ENCOUNTER — Telehealth: Payer: Self-pay | Admitting: Internal Medicine

## 2024-02-26 ENCOUNTER — Encounter: Payer: Self-pay | Admitting: *Deleted

## 2024-02-26 NOTE — Telephone Encounter (Signed)
 Spoke to patient he stated he already has a appointment with Glendia Ferrier PA tomorrow 8/19 at 1:55 pm for a surgical clearance.

## 2024-02-26 NOTE — Telephone Encounter (Signed)
 Pt walked in today and stated that someone was supposed to call him to schedule an appt for preop clearance. I apologized to the pt that the message was never sent to the preop call back team to call him. Pt accepted the apology and accepted the appt for tomorrow 02/27/24 with Glendia Ferrier, PAC.

## 2024-02-27 ENCOUNTER — Encounter: Payer: Self-pay | Admitting: *Deleted

## 2024-02-27 ENCOUNTER — Ambulatory Visit: Attending: Physician Assistant | Admitting: Home Health

## 2024-02-27 ENCOUNTER — Encounter: Payer: Self-pay | Admitting: Physician Assistant

## 2024-02-27 VITALS — BP 150/80 | HR 74 | Ht 72.0 in | Wt 262.4 lb

## 2024-02-27 DIAGNOSIS — Z01810 Encounter for preprocedural cardiovascular examination: Secondary | ICD-10-CM | POA: Diagnosis not present

## 2024-02-27 DIAGNOSIS — Z01818 Encounter for other preprocedural examination: Secondary | ICD-10-CM

## 2024-02-27 DIAGNOSIS — I350 Nonrheumatic aortic (valve) stenosis: Secondary | ICD-10-CM | POA: Diagnosis not present

## 2024-02-27 NOTE — Progress Notes (Incomplete)
 Anesthesia Chart Review   Case: [id number] Date/Time: 03/01/24 0715   Procedure: COLECTOMY, SIGMOID, ROBOT-ASSISTED   Anesthesia type: General   Diagnosis: Mass of colon [K63.89]   Pre-op diagnosis: COLON MASS CECYN   Location: [facility] ROOM 02 / [facility]   Surgeons: Debby Hila, MD       DISCUSSION:63 y.o. former smoker with h/o HTN, a-flutter s/p ablation 09/29/2021, DM II (A1C 6.3), liver cirrhosis, colon mass scheduled for above procedure 03/01/24 with Dr. Hila Debby.    VS: BP (!) 154/78 Comment: right arm sitting  Pulse 72   Temp 36.9 C (Oral)   Resp 20   Ht 6' (1.829 m)   Wt 117.5 kg   SpO2 97%   BMI 35.13 kg/m   PROVIDERS: Barbra Odor, NP is PCP  Cardiologist:  Annabella Scarce, MD Electrophysiologist:  Danelle Birmingham, MD   LABS: Labs reviewed: Acceptable for surgery. (all labs ordered are listed, but only abnormal results are displayed)  Labs Reviewed  HEMOGLOBIN A1C - Abnormal; Notable for the following components:      Result Value   Hgb A1c MFr Bld 6.3 (*)    All other components within normal limits  COMPREHENSIVE METABOLIC PANEL WITH GFR - Abnormal; Notable for the following components:   Glucose, Bld 131 (*)    BUN 7 (*)    AST 46 (*)    All other components within normal limits  CBC - Abnormal; Notable for the following components:   RDW 20.7 (*)    All other components within normal limits  GLUCOSE, CAPILLARY - Abnormal; Notable for the following components:   Glucose-Capillary 144 (*)    All other components within normal limits  TYPE AND SCREEN     IMAGES:   EKG:   CV: Echo 11/04/2022 1. Left ventricular ejection fraction, by estimation, is 60 to 65%. The  left ventricle has normal function. The left ventricle has no regional  wall motion abnormalities. Left ventricular diastolic parameters were  normal. The average left ventricular  global longitudinal strain is -16.2 %. The global longitudinal strain is  normal.   2. Right  ventricular systolic function is normal. The right ventricular  size is normal.   3. Left atrial size was mildly dilated.   4. The mitral valve is abnormal. No evidence of mitral valve  regurgitation. No evidence of mitral stenosis.   5. Cannot tell if valve is bicuspid or tri leaflet . The aortic valve is  normal in structure. There is severe calcifcation of the aortic valve.  There is severe thickening of the aortic valve. Aortic valve regurgitation  is moderate. Moderate aortic valve   stenosis.   6. The inferior vena cava is normal in size with greater than 50%  respiratory variability, suggesting right atrial pressure of 3 mmHg.   Past Medical History:  Diagnosis Date   Anemia    Arthritis    Diabetes mellitus without complication (HCC)    Type II   Dysrhythmia    aFlutter   had ablation 09-29-2021   GERD (gastroesophageal reflux disease)    Gout    HLD (hyperlipidemia)    Hypertension    Liver cirrhosis (HCC)     Past Surgical History:  Procedure Laterality Date   A-FLUTTER ABLATION N/A 08/02/2021   Procedure: A-FLUTTER ABLATION;  Surgeon: Birmingham Danelle ORN, MD;  Location: [facility] INVASIVE CV LAB;  Service: Cardiovascular;  Laterality: N/A;   A-FLUTTER ABLATION N/A 09/29/2021   Procedure: A-FLUTTER ABLATION;  Surgeon: Birmingham,  Danelle ORN, MD;  Location: [facility] INVASIVE CV LAB;  Service: Cardiovascular;  Laterality: N/A;   ANTERIOR CRUCIATE LIGAMENT REPAIR Right    x 2   HERNIA REPAIR  2010   umbilical hernia repaired with mesh   LUMBAR FUSION     L3-L4   ROTATOR CUFF REPAIR Right     MEDICATIONS:  atorvastatin (LIPITOR) 20 MG tablet   bisacodyl 5 MG EC tablet   Blood Glucose Monitoring Suppl (TRUE METRIX AIR GLUCOSE METER) DEVI   colchicine 0.6 MG tablet   FEROSUL 325 (65 Fe) MG tablet   flecainide  (TAMBOCOR ) 100 MG tablet   metFORMIN  (GLUCOPHAGE ) 500 MG tablet   metoprolol  succinate (TOPROL -XL) 50 MG 24 hr tablet   metroNIDAZOLE  (FLAGYL ) 500 MG tablet   neomycin  (MYCIFRADIN) 500 MG tablet   polyethylene glycol powder (GLYCOLAX /MIRALAX ) 17 GM/SCOOP powder   tadalafil (CIALIS) 20 MG tablet   No current facility-administered medications for this encounter.

## 2024-02-27 NOTE — Addendum Note (Signed)
 Addended by: MEMORY DELON POUR on: 02/27/2024 02:53 PM   Modules accepted: Orders

## 2024-02-27 NOTE — Progress Notes (Addendum)
 Cardiology Office Note   Date:  02/27/2024  ID:  DOVID BARTKO, DOB 1961-01-29, MRN [id number] PCP: Barbra Odor, NP  [facility] Providers Cardiologist:  Annabella Scarce, MD Electrophysiologist:  Danelle Birmingham, MD  History of Present Illness Caleb [name] is a 63 y.o. male with PMH of HTN, HLD,  typical atrial flutter s/p ablation 2023, aortic stenosis,  type 2 DM, obesity, colon mass, who presented for a pre-op risk evaluation for elective elective robotic assisted right colectomy.   He follows Dr. Birmingham historically, suffered typical atrial flutter in the past.  He underwent initial EP study and ablation, failed to achieve isthmus block and the procedure had to stop due to high fluoroscopy time.  He then underwent recurrent EP study and atrial flutter ablation 09/29/21 with CARTO.  He has maintained in sinus rhythm over past 2 years.  He was last seen by Dr. Birmingham 12/23/2022, feeling well, Eliquis  was stopped and that he was continued on flecainide .    Most recent echocardiogram from 11/04/2022 showed LVEF 60 to 65%, no regional motion abnormality, diastolic parameter were normal, normal RV, mild LAE, severe calcification of aortic valve were noted, moderate AI, moderate aortic stenosis with mean gradient 28 mmHg, peak gradient 47.3 mmHg, VTI 1.31 cm2.   Due to heme positive stool and anemia, he underwent recent colonoscopy and upper EGD on 01/22/2024.  Scope revealed 12 mm polyp in the rectum and this was removed; 4 polyps were found in the descending colon and transverse colon, these were removed; 4 to 5 cm soft polypoid masses noted of cecum, which was biopsied; 4 pedunculated and semipedunculated  polyps measuring 10 and 25 mm were noted in cecum/not removed; multiple diverticula were found in the entire colon; Internal hemorrhoids.  He was referred to general surgery Dr. Debby and was seen on 02/05/2024, despite negative biopsy for malignancy, due to the size of ascending colon  mass, he was recommended robotic assisted right colectomy.   He has been doing well, denied any resting or exertional chest pain, SOB, syncope, orthopnea, PND, leg edema. He is a retired from working for [employer]. He reports no limitation on daily function, able to run a short distance, working in the yard, do household work if he wishes. He denied hx of MI, CAD, CHF, CVA, CKD. He is on metformin  for DM.    ROS:  Constitutional: Denied fever, chills, malaise, night sweats Eyes: Denied vision change or loss Ears/Nose/Mouth/Throat: Denied ear ache, sore throat, coughing, sinus pain Cardiovascular: see HPI  Respiratory: see HPI  Gastrointestinal: Denied nausea, vomiting, abdominal pain, diarrhea Genital/Urinary: Denied dysuria, hematuria, urinary frequency/urgency Musculoskeletal: Denied muscle ache, joint pain, weakness Skin: Denied rash, wound Neuro: Denied headache, dizziness, syncope Psych: Denied history of depression/anxiety  Endocrine: history of diabetes   Studies Reviewed  CBC from 02/19/2024 reviewed, gorssly unremarkable   CMP from 02/19/2024 reviewed, grossly unremarkable  Hemoglobin A1c from 02/19/2024 reviewed, 6.3%  EKG from 02/19/2024 reviewed: Sinus rhythm 71 bpm, no acute changes comparing to 09/29/2021 EKG     Echocardiogram 11/04/22:   1. Left ventricular ejection fraction, by estimation, is 60 to 65%. The  left ventricle has normal function. The left ventricle has no regional  wall motion abnormalities. Left ventricular diastolic parameters were  normal. The average left ventricular  global longitudinal strain is -16.2 %. The global longitudinal strain is  normal.   2. Right ventricular systolic function is normal. The right ventricular  size is normal.  3. Left atrial size was mildly dilated.   4. The mitral valve is abnormal. No evidence of mitral valve  regurgitation. No evidence of mitral stenosis.   5. Cannot tell if valve  is bicuspid or tri leaflet . The aortic valve is  normal in structure. There is severe calcifcation of the aortic valve.  There is severe thickening of the aortic valve. Aortic valve regurgitation  is moderate. Moderate aortic valve   stenosis.   6. The inferior vena cava is normal in size with greater than 50%  respiratory variability, suggesting right atrial pressure of 3 mmHg.       ECHOCARDIOGRAM REPORT       Patient Name:   Caleb [name] Date of Exam: 11/04/2022 Medical Rec #:  [id number]       Height:       72.0 in Accession #:    [id number]      Weight:       260.0 lb Date of Birth:  1960-10-19       BSA:          2.382 m Patient Age:    62 years        BP:           140/75 mmHg Patient Gender: M               HR:           73 bpm. Exam Location:  Outpatient  Procedure: 2D Echo, 3D Echo, Cardiac Doppler, Color Doppler and Strain Analysis  Indications:    I48.92* Unspecified atrial flutter   History:        Patient has prior history of Echocardiogram examinations, most                 recent 04/05/2020. Ablation 09/29/2021, Aortic Valve Disease,                 Arrythmias:Atrial Flutter; Risk Factors:Hypertension, Diabetes,                 Former Smoker and Dyslipidemia. Patient denies chest pain, SOB                 and leg edema.   Sonographer:    Annabella Cater RVT, RDCS (AE), RDMS Referring Phys: [id number] DANELLE LELON [place]  IMPRESSIONS    1. Left ventricular ejection fraction, by estimation, is 60 to 65%. The left ventricle has normal function. The left ventricle has no regional wall motion abnormalities. Left ventricular diastolic parameters were normal. The average left ventricular global longitudinal strain is -16.2 %. The global longitudinal strain is normal.  2. Right ventricular systolic function is normal. The right ventricular size is normal.  3. Left atrial size was mildly dilated.  4. The mitral valve is abnormal. No evidence of mitral  valve regurgitation. No evidence of mitral stenosis.  5. Cannot tell if valve is bicuspid or tri leaflet . The aortic valve is normal in structure. There is severe calcifcation of the aortic valve. There is severe thickening of the aortic valve. Aortic valve regurgitation is moderate. Moderate aortic valve  stenosis.  6. The inferior vena cava is normal in size with greater than 50% respiratory variability, suggesting right atrial pressure of 3 mmHg.  Comparison(s): EF 50%, severe thickening of AOV, mild to moderate AOV sclerosis, no AS or AI seen. Questionable bicuspid AOV.  FINDINGS  Left Ventricle: Left ventricular ejection fraction, by estimation, is 60 to 65%. The left ventricle has  normal function. The left ventricle has no regional wall motion abnormalities. The average left ventricular global longitudinal strain is -16.2 %. The global longitudinal strain is normal. The left ventricular internal cavity size was normal in size. There is no left ventricular hypertrophy. Left ventricular diastolic parameters were normal.  Right Ventricle: The right ventricular size is normal. No increase in right ventricular wall thickness. Right ventricular systolic function is normal.  Left Atrium: Left atrial size was mildly dilated.  Right Atrium: Right atrial size was normal in size.  Pericardium: There is no evidence of pericardial effusion.  Mitral Valve: The mitral valve is abnormal. There is mild thickening of the mitral valve leaflet(s). There is mild calcification of the mitral valve leaflet(s). Mild mitral annular calcification. No evidence of mitral valve regurgitation. No evidence of mitral valve stenosis.  Tricuspid Valve: The tricuspid valve is normal in structure. Tricuspid valve regurgitation is mild . No evidence of tricuspid stenosis.  Aortic Valve: Cannot tell if valve is bicuspid or tri leaflet. The aortic valve is normal in structure. There is severe calcifcation  of the aortic valve. There is severe thickening of the aortic valve. Aortic valve regurgitation is moderate. Aortic regurgitation PHT measures 365 msec. Moderate aortic stenosis is present. Aortic valve mean gradient measures 28.0 mmHg. Aortic valve peak gradient measures 47.3 mmHg. Aortic valve area, by VTI measures 1.31 cm.     Risk Assessment/Calculations  CHA2DS2-VASc Score = 2  This indicates a 2.2% annual risk of stroke. The patient's score is based upon: CHF History: 0 HTN History: 1 Diabetes History: 1 Stroke History: 0 Vascular Disease History: 0 Age Score: 0 Gender Score: 0    HYPERTENSION CONTROL Vitals:   02/27/24 1332 02/27/24 1402  BP: (!) 150/82 (!) 150/80    The patient's blood pressure is elevated above target today.  In order to address the patient's elevated BP: The blood pressure is usually elevated in clinic.  Blood pressures monitored at home have been optimal.; Blood pressure will be monitored at home to determine if medication changes need to be made.          Physical Exam VS:  BP (!) 150/80 (Cuff Size: Normal)   Pulse 74   Ht 6' (1.829 m)   Wt 262 lb 6.4 oz (119 kg)   SpO2 96%   BMI 35.59 kg/m        Wt Readings from Last 3 Encounters:  02/27/24 262 lb 6.4 oz (119 kg)  02/19/24 259 lb (117.5 kg)  01/22/24 262 lb (118.8 kg)    GEN: Well nourished, well developed in no acute distress NECK: No JVD CARDIAC: RRR, grade III systolic murmur loudest at RUSB RESPIRATORY:  Clear to auscultation without rales, wheezing or rhonchi  ABDOMEN: Soft, non-tender, non-distended EXTREMITIES:  No leg edema; No deformity   ASSESSMENT AND PLAN  Preop risk evaluation -Revised cardiac risk index 1 (intraperitoneal) -DASI score suggests 8.97 METS -Given known moderate aortic stenosis noted on echocardiogram 11/04/22, recommend using cardiac anesthesia, avoid tachycardia and hypotension peri-operatively, obtain updated Echo before surgery   History of  typical atrial flutter -Status post successful atrial flutter ablation 09/2021, has remained in sinus rhythm -He is on longer taking Eliquis  for anticoagulation per Dr. Waddell recommendation  -He is continued on flecainide  100mg  BID, follows Dr. Waddell, wants to establish with Dr Wonda after Dr Waddell lucky, Dr Wonda has agreed on this   Moderate aortic stenosis  - This was noted on echocardiogram from 11/04/2022, mean gradient  28 mmHg, peak gradient 47.3 mmHg, VTI 1.31 cm - He has no significant chest pain, shortness of breath, syncope at rest or with exertion; review symptoms to watch for  -Will repeat annual Echo before OR, scheduled this Friday AM  - Continue current metoprolol  XL 50 mg twice daily for hypertension and Lipitor 20 mg daily for HLD  Hypertension  - Blood pressure is elevated today, he states he is always higher at doctor's office due to anxiety, normally SBP 130s at home, will continue current metoprolol  XL 50 mg twice daily  Hyperlipidemia - Continue current Lipitor 20 mg daily, lipid profile was checked by PCP recently, will try get records  Type 2 diabetes -Last hemoglobin A1c 6.3% on 02/19/2024, fair control, on metformin , follow-up with PCP       Dispo: follow up in 6-8 months with new MD or APP, call if develop new symptoms suggestive of CHF    Signed, Savvy Peeters, NP      Addendum 03/04/24:  Echo from  03/01/24:  1. Left ventricular ejection fraction, by estimation, is 60 to 65%. Left ventricular ejection fraction by 3D volume is 60 %. The left ventricle has normal function. The left ventricle has no regional wall motion abnormalities. Left ventricular diastolic  parameters are indeterminate. The average left ventricular global longitudinal strain is -20.6 %. The global longitudinal strain is normal.  2. Right ventricular systolic function is normal. The right ventricular size is normal. Tricuspid regurgitation signal is inadequate for assessing PA  pressure.  3. The mitral valve is degenerative. Trivial mitral valve regurgitation. No evidence of mitral stenosis. Moderate mitral annular calcification.  4. The aortic valve is calcified. There is moderate calcification of the aortic valve. There is moderate thickening of the aortic valve. Aortic valve regurgitation is mild to moderate. Moderate aortic valve stenosis. Aortic valve area, by VTI measures  1.06 cm. Aortic valve mean gradient measures 33.0 mmHg. Aortic valve Vmax measures 3.74 m/s.  5. Aortic dilatation noted. There is mild dilatation of the aortic root, measuring 40 mm.  6. The inferior vena cava is normal in size with greater than 50% respiratory variability, suggesting right atrial pressure of 3 mmHg.   Comparison(s): No significant change from prior study.   Moderate aortic stenosis remains stable, slight increase of mean gradient from 28 to 33 mmHg, VTI was 1.31 cm2 and now 1.06 cm2.  He remains asymptomatic.  He is at acceptable risk for planned robotic assisted right colectomy.  Again, consider using cardiac anesthesia, avoid tachycardia and hypotension.   There is mild dilatation of aortic root measuring 40 mm echocardiogram, recommended none urgent CTA chest for more accurate evaluation.  I have messaged office staff to arrange this for him.   Given asymptomatic moderate aortic stenosis, he is qualified for Dr Parry experiential trial of medical therapy for slowing progression of AS.   Case was referred to Dr Wendel, after discussing with the patient, he expressed that he is not interested at this time.

## 2024-02-27 NOTE — Patient Instructions (Signed)
 Medication Instructions:  Your physician recommends that you continue on your current medications as directed. Please refer to the Current Medication list given to you today.  *If you need a refill on your cardiac medications before your next appointment, please call your pharmacy*  Lab Work: None ordered  If you have labs (blood work) drawn today and your tests are completely normal, you will receive your results only by: MyChart Message (if you have MyChart) OR A paper copy in the mail If you have any lab test that is abnormal or we need to change your treatment, we will call you to review the results.  Testing/Procedures: Your physician has requested that you have an echocardiogram. Echocardiography is a painless test that uses sound waves to create images of your heart. It provides your doctor with information about the size and shape of your heart and how well your heart's chambers and valves are working. This procedure takes approximately one hour. There are no restrictions for this procedure. Please do NOT wear cologne, perfume, aftershave, or lotions (deodorant is allowed). Please arrive 15 minutes prior to your appointment time.  Please note: We ask at that you not bring children with you during ultrasound (echo/ vascular) testing. Due to room size and safety concerns, children are not allowed in the ultrasound rooms during exams. Our front office staff cannot provide observation of children in our lobby area while testing is being conducted. An adult accompanying a patient to their appointment will only be allowed in the ultrasound room at the discretion of the ultrasound technician under special circumstances. We apologize for any inconvenience.   Follow-Up: At [facility], you and your health needs are our priority.  As part of our continuing mission to provide you with exceptional heart care, our providers are all part of one team.  This team includes your primary  Cardiologist (physician) and Advanced Practice Providers or APPs (Physician Assistants and Nurse Practitioners) who all work together to provide you with the care you need, when you need it.  Your next appointment:   8 month(s)  Provider:   Glendia Ferrier, PA-C          We recommend signing up for the patient portal called MyChart.  Sign up information is provided on this After Visit Summary.  MyChart is used to connect with patients for Virtual Visits (Telemedicine).  Patients are able to view lab/test results, encounter notes, upcoming appointments, etc.  Non-urgent messages can be sent to your provider as well.   To learn more about what you can do with MyChart, go to [email or web address].   Other Instructions

## 2024-03-01 ENCOUNTER — Ambulatory Visit (HOSPITAL_COMMUNITY)
Admission: RE | Admit: 2024-03-01 | Discharge: 2024-03-01 | Disposition: A | Discharge status: Home / Self Care | Source: Ambulatory Visit | Attending: Cardiology | Admitting: Cardiology

## 2024-03-01 ENCOUNTER — Inpatient Hospital Stay (HOSPITAL_COMMUNITY): Admission: RE | Admit: 2024-03-01 | Source: Ambulatory Visit | Admitting: General Surgery

## 2024-03-01 ENCOUNTER — Encounter (HOSPITAL_COMMUNITY): Admission: RE | Payer: Self-pay | Source: Ambulatory Visit

## 2024-03-01 DIAGNOSIS — Z01818 Encounter for other preprocedural examination: Secondary | ICD-10-CM | POA: Diagnosis not present

## 2024-03-01 DIAGNOSIS — I350 Nonrheumatic aortic (valve) stenosis: Secondary | ICD-10-CM

## 2024-03-01 LAB — ECHOCARDIOGRAM COMPLETE
AR max vel: 1.13 cm2
AV Area VTI: 1.06 cm2
AV Area mean vel: 1.07 cm2
AV Mean grad: 33 mmHg
AV Peak grad: 56 mmHg
Ao pk vel: 3.74 m/s
Area-P 1/2: 3.24 cm2
S' Lateral: 2.92 cm

## 2024-03-01 SURGERY — COLECTOMY, SIGMOID, ROBOT-ASSISTED
Anesthesia: General

## 2024-03-04 ENCOUNTER — Ambulatory Visit: Payer: Self-pay | Admitting: Home Health

## 2024-03-05 NOTE — Telephone Encounter (Signed)
 Notes faxed to surgeon. Glendia Ferrier, PA-C    03/05/2024 4:16 PM

## 2024-03-17 ENCOUNTER — Other Ambulatory Visit: Payer: Self-pay | Admitting: Nurse Practitioner

## 2024-04-01 NOTE — Patient Instructions (Signed)
 SURGICAL WAITING ROOM VISITATION  Patients having surgery or a procedure may have no more than 2 support people in the waiting area - these visitors may rotate.    Children under the age of 18 must have an adult with them who is not the patient.  Visitors with respiratory illnesses are discouraged from visiting and should remain at home.  If the patient needs to stay at the hospital during part of their recovery, the visitor guidelines for inpatient rooms apply. Pre-op nurse will coordinate an appropriate time for 1 support person to accompany patient in pre-op.  This support person may not rotate.    Please refer to the [facility] website for the visitor guidelines for Inpatients (after your surgery is over and you are in a regular room).       Your procedure is scheduled on: 04/12/24   Report to [facility] Main Entrance    Report to admitting at 10:15 AM   Call this number if you have problems the morning of surgery [phone]   Do not eat food :After Midnight.   After Midnight you may have the following liquids until 9:30 AM DAY OF SURGERY  Water Non-Citrus Juices (without pulp, NO RED-Apple, White grape, White cranberry) Black Coffee (NO MILK/CREAM OR CREAMERS, sugar ok)  Clear Tea (NO MILK/CREAM OR CREAMERS, sugar ok) regular and decaf                             Plain Jell-O (NO RED)                                           Fruit ices (not with fruit pulp, NO RED)                                     Popsicles (NO RED)                                                               Sports drinks like Gatorade (NO RED)                The day of surgery:  Drink ONE (1) Pre-Surgery G2 at  9:30 AM the morning of surgery. Drink in one sitting. Do not sip.  This drink was given to you during your hospital  pre-op appointment visit. Nothing else to drink after completing the  Pre-Surgery G2.   Drink 2 Gatorade the evening prior to surgery by 10 PM.  FOLLOW BOWEL  PREP AND ANY ADDITIONAL PRE OP INSTRUCTIONS YOU RECEIVED FROM YOUR SURGEON'S OFFICE!!!     Oral Hygiene is also important to reduce your risk of infection.                                    Remember - BRUSH YOUR TEETH THE MORNING OF SURGERY WITH YOUR REGULAR TOOTHPASTE  DENTURES WILL BE REMOVED PRIOR TO SURGERY PLEASE DO NOT APPLY Poly grip OR ADHESIVES!!!   Stop all  vitamins and herbal supplements 7 days before surgery.   Take these medicines the morning of surgery with A SIP OF WATER: Atorvastatin, Colchicine, flecainide (tambocor ), Metoprolol .  DO NOT TAKE ANY ORAL DIABETIC MEDICATIONS DAY OF YOUR SURGERY Hold Metformin  the morning of surgery.             You may not have any metal on your body including hair pins, jewelry, and body piercing             Do not wear make-up, lotions, powders, perfumes/cologne, or deodorant              Men may shave face and neck.   Do not bring valuables to the hospital. [facility] IS NOT             RESPONSIBLE   FOR VALUABLES.   Contacts, glasses, dentures or bridgework may not be worn into surgery.   Bring small overnight bag day of surgery.   DO NOT BRING YOUR HOME MEDICATIONS TO THE HOSPITAL. PHARMACY WILL DISPENSE MEDICATIONS LISTED ON YOUR MEDICATION LIST TO YOU DURING YOUR ADMISSION IN THE HOSPITAL!    Patients discharged on the day of surgery will not be allowed to drive home.  Someone NEEDS to stay with you for the first 24 hours after anesthesia.   Special Instructions: Bring a copy of your healthcare power of attorney and living will documents the day of surgery if you haven't scanned them before.              Please read over the following fact sheets you were given: IF YOU HAVE QUESTIONS ABOUT YOUR PRE-OP INSTRUCTIONS PLEASE CALL [phone] [place]   If you received a COVID test during your pre-op visit  it is requested that you wear a mask when out in public, stay away from anyone that may not be feeling well and notify your  surgeon if you develop symptoms. If you test positive for Covid or have been in contact with anyone that has tested positive in the last 10 days please notify you surgeon.    [facility] - Preparing for Surgery Before surgery, you can play an important role.  Because skin is not sterile, your skin needs to be as free of germs as possible.  You can reduce the number of germs on your skin by washing with CHG (chlorahexidine gluconate) soap before surgery.  CHG is an antiseptic cleaner which kills germs and bonds with the skin to continue killing germs even after washing. Please DO NOT use if you have an allergy to CHG or antibacterial soaps.  If your skin becomes reddened/irritated stop using the CHG and inform your nurse when you arrive at Short Stay. Do not shave (including legs and underarms) for at least 48 hours prior to the first CHG shower.  You may shave your face/neck.  Please follow these instructions carefully:  1.  Shower with CHG Soap the night before surgery and the  morning of surgery.  2.  If you choose to wash your hair, wash your hair first as usual with your normal  shampoo.  3.  After you shampoo, rinse your hair and body thoroughly to remove the shampoo.                             4.  Use CHG as you would any other liquid soap.  You can apply chg directly to the skin and wash.  Gently  with a scrungie or clean washcloth.  5.  Apply the CHG Soap to your body ONLY FROM THE NECK DOWN.   Do   not use on face/ open                           Wound or open sores. Avoid contact with eyes, ears mouth and   genitals (private parts).                       Wash face,  Genitals (private parts) with your normal soap.             6.  Wash thoroughly, paying special attention to the area where your    surgery  will be performed.  7.  Thoroughly rinse your body with warm water from the neck down.  8.  DO NOT shower/wash with your normal soap after using and rinsing off the CHG Soap.                 9.  Pat yourself dry with a clean towel.            10.  Wear clean pajamas.            11.  Place clean sheets on your bed the night of your first shower and do not  sleep with pets. Day of Surgery : Do not apply any lotions/deodorants the morning of surgery.  Please wear clean clothes to the hospital/surgery center.  FAILURE TO FOLLOW THESE INSTRUCTIONS MAY RESULT IN THE CANCELLATION OF YOUR SURGERY  _Incentive Spirometer   An incentive spirometer is a tool that can help keep your lungs clear and active. This tool measures how well you are filling your lungs with each breath. Taking long deep breaths may help reverse or decrease the chance of developing breathing (pulmonary) problems (especially infection) following: A long period of time when you are unable to move or be active. BEFORE THE PROCEDURE  If the spirometer includes an indicator to show your best effort, your nurse or respiratory therapist will set it to a desired goal. If possible, sit up straight or lean slightly forward. Try not to slouch. Hold the incentive spirometer in an upright position. INSTRUCTIONS FOR USE  Sit on the edge of your bed if possible, or sit up as far as you can in bed or on a chair. Hold the incentive spirometer in an upright position. Breathe out normally. Place the mouthpiece in your mouth and seal your lips tightly around it. Breathe in slowly and as deeply as possible, raising the piston or the ball toward the top of the column. Hold your breath for 3-5 seconds or for as long as possible. Allow the piston or ball to fall to the bottom of the column. Remove the mouthpiece from your mouth and breathe out normally. Rest for a few seconds and repeat Steps 1 through 7 at least 10 times every 1-2 hours when you are awake. Take your time and take a few normal breaths between deep breaths. The spirometer may include an indicator to show your best effort. Use the indicator as a goal to work toward during  each repetition. After each set of 10 deep breaths, practice coughing to be sure your lungs are clear. If you have an incision (the cut made at the time of surgery), support your incision when coughing by placing a pillow or rolled up towels firmly against  it. Once you are able to get out of bed, walk around indoors and cough well. You may stop using the incentive spirometer when instructed by your caregiver.  RISKS AND COMPLICATIONS Take your time so you do not get dizzy or light-headed. If you are in pain, you may need to take or ask for pain medication before doing incentive spirometry. It is harder to take a deep breath if you are having pain. AFTER USE Rest and breathe slowly and easily. It can be helpful to keep track of a log of your progress. Your caregiver can provide you with a simple table to help with this. If you are using the spirometer at home, follow these instructions: SEEK MEDICAL CARE IF:  You are having difficultly using the spirometer. You have trouble using the spirometer as often as instructed. Your pain medication is not giving enough relief while using the spirometer. You develop fever of 100.5 F (38.1 C) or higher. SEEK IMMEDIATE MEDICAL CARE IF:  You cough up bloody sputum that had not been present before. You develop fever of 102 F (38.9 C) or greater. You develop worsening pain at or near the incision site. MAKE SURE YOU:  Understand these instructions. Will watch your condition. Will get help right away if you are not doing well or get worse.  SABRAHow to Manage Your Diabetes Before and After Surgery  Why is it important to control my blood sugar before and after surgery? Improving blood sugar levels before and after surgery helps healing and can limit problems. A way of improving blood sugar control is eating a healthy diet by:  Eating less sugar and carbohydrates  Increasing activity/exercise  Talking with your doctor about reaching your blood sugar  goals High blood sugars (greater than 180 mg/dL) can raise your risk of infections and slow your recovery, so you will need to focus on controlling your diabetes during the weeks before surgery. Make sure that the doctor who takes care of your diabetes knows about your planned surgery including the date and location.  How do I manage my blood sugar before surgery? Check your blood sugar at least 4 times a day, starting 2 days before surgery, to make sure that the level is not too high or low. Check your blood sugar the morning of your surgery when you wake up and every 2 hours until you get to the Short Stay unit. If your blood sugar is less than 70 mg/dL, you will need to treat for low blood sugar: Do not take insulin . Treat a low blood sugar (less than 70 mg/dL) with  cup of clear juice (cranberry or apple), 4 glucose tablets, OR glucose gel. Recheck blood sugar in 15 minutes after treatment (to make sure it is greater than 70 mg/dL). If your blood sugar is not greater than 70 mg/dL on recheck, call [phone] for further instructions. Report your blood sugar to the short stay nurse when you get to Short Stay.  If you are admitted to the hospital after surgery: Your blood sugar will be checked by the staff and you will probably be given insulin  after surgery (instead of oral diabetes medicines) to make sure you have good blood sugar levels. The goal for blood sugar control after surgery is 80-180 mg/dL.   WHAT DO I DO ABOUT MY DIABETES MEDICATION?  Do not take oral diabetes medicines (pills) the morning of surgery.  Patient Signature:  Date:   Nurse Signature:  Date:   Reviewed and Endorsed by  [facility] Patient Education Committee, August 2015

## 2024-04-01 NOTE — Progress Notes (Signed)
 COVID Vaccine received:  [x]  No []  Yes Date of any COVID positive Test in last 90 days: no PCP - Alfonso Goltz NP @ [facility] Cardiologist - Annabella Scarce MD Electrophys.- Dr. Danelle Birmingham  Chest x-ray -  EKG -  02/19/24 Epic Stress Test -  ECHO - 03/01/24 Epic Cardiac Cath -   Cardiac clearance 02/27/24 -Shirl Fruits NP  Bowel Prep - [x]  No  []   Yes ______  Pacemaker / ICD device [x]  No []  Yes   Spinal Cord Stimulator:[x]  No []  Yes       History of Sleep Apnea? [x]  No []  Yes   CPAP used?- [x]  No []  Yes    Does the patient monitor blood sugar?          []  No [x]  Yes  []  N/A  Patient has: []  NO Hx DM   []  Pre-DM                 []  DM1  [x]   DM2 Does patient have a [employer] or Dexacom? [x]  No []  Yes   Fasting Blood Sugar Ranges-  Checks Blood Sugar ___1__ times a month  GLP1 agonist / usual dose - no GLP1 instructions:  SGLT-2 inhibitors / usual dose - no  Blood Thinner / Instructions:no Aspirin Instructions:no  Comments:   Activity level: Patient is able to climb a flight of stairs without difficulty; [x]  No CP  [x]  No SOB,   Patient can perform ADLs without assistance.   Anesthesia review: DM, HTN, A-flutter w/ ablation x2.  Patient denies shortness of breath, fever, cough and chest pain at PAT appointment.  Patient verbalized understanding and agreement to the Pre-Surgical Instructions that were given to them at this PAT appointment. Patient was also educated of the need to review these PAT instructions again prior to his/her surgery.I reviewed the appropriate phone numbers to call if they have any and questions or concerns.

## 2024-04-02 ENCOUNTER — Other Ambulatory Visit: Payer: Self-pay

## 2024-04-02 ENCOUNTER — Encounter (HOSPITAL_COMMUNITY)
Admission: RE | Admit: 2024-04-02 | Discharge: 2024-04-02 | Disposition: A | Discharge status: Home / Self Care | Source: Ambulatory Visit | Attending: General Surgery | Admitting: General Surgery

## 2024-04-02 VITALS — BP 139/78 | HR 65 | Temp 98.3°F | Resp 16 | Ht 72.0 in | Wt 260.0 lb

## 2024-04-02 DIAGNOSIS — E119 Type 2 diabetes mellitus without complications: Secondary | ICD-10-CM | POA: Diagnosis not present

## 2024-04-02 DIAGNOSIS — Z01818 Encounter for other preprocedural examination: Secondary | ICD-10-CM

## 2024-04-02 DIAGNOSIS — K7460 Unspecified cirrhosis of liver: Secondary | ICD-10-CM | POA: Insufficient documentation

## 2024-04-02 DIAGNOSIS — Z7984 Long term (current) use of oral hypoglycemic drugs: Secondary | ICD-10-CM | POA: Diagnosis not present

## 2024-04-02 DIAGNOSIS — I350 Nonrheumatic aortic (valve) stenosis: Secondary | ICD-10-CM | POA: Diagnosis not present

## 2024-04-02 DIAGNOSIS — Z981 Arthrodesis status: Secondary | ICD-10-CM | POA: Diagnosis not present

## 2024-04-02 DIAGNOSIS — Z01812 Encounter for preprocedural laboratory examination: Secondary | ICD-10-CM | POA: Insufficient documentation

## 2024-04-02 DIAGNOSIS — Z87891 Personal history of nicotine dependence: Secondary | ICD-10-CM | POA: Insufficient documentation

## 2024-04-02 DIAGNOSIS — K6389 Other specified diseases of intestine: Secondary | ICD-10-CM | POA: Diagnosis not present

## 2024-04-02 DIAGNOSIS — I10 Essential (primary) hypertension: Secondary | ICD-10-CM | POA: Diagnosis not present

## 2024-04-02 DIAGNOSIS — I77810 Thoracic aortic ectasia: Secondary | ICD-10-CM | POA: Insufficient documentation

## 2024-04-02 DIAGNOSIS — I3481 Nonrheumatic mitral (valve) annulus calcification: Secondary | ICD-10-CM | POA: Insufficient documentation

## 2024-04-02 LAB — BASIC METABOLIC PANEL WITH GFR
Anion gap: 13 (ref 5–15)
BUN: 8 mg/dL (ref 8–23)
CO2: 23 mmol/L (ref 22–32)
Calcium: 8.9 mg/dL (ref 8.9–10.3)
Chloride: 104 mmol/L (ref 98–111)
Creatinine, Ser: 0.91 mg/dL (ref 0.61–1.24)
GFR, Estimated: >60 mL/min (ref >60)
Glucose, Bld: 116 mg/dL — ABNORMAL HIGH (ref 70–99)
Potassium: 4.7 mmol/L (ref 3.5–5.1)
Sodium: 140 mmol/L (ref 135–145)

## 2024-04-02 LAB — CBC
HCT: 49.1 % (ref 39.0–52.0)
Hemoglobin: 15.6 g/dL (ref 13.0–17.0)
MCH: 29.4 pg (ref 26.0–34.0)
MCHC: 31.8 g/dL (ref 30.0–36.0)
MCV: 92.5 fL (ref 80.0–100.0)
Platelets: 178 K/uL (ref 150–400)
RBC: 5.31 MIL/uL (ref 4.22–5.81)
RDW: 16.2 % — ABNORMAL HIGH (ref 11.5–15.5)
WBC: 5 K/uL (ref 4.0–10.5)
nRBC: 0 % (ref 0.0–0.2)

## 2024-04-02 LAB — GLUCOSE, CAPILLARY: Glucose-Capillary: 119 mg/dL — ABNORMAL HIGH (ref 70–99)

## 2024-04-03 NOTE — Progress Notes (Signed)
 Anesthesia Chart Review   Case: [id number] Date/Time: 04/12/24 1215   Procedure: COLECTOMY, PARTIAL, ROBOT-ASSISTED, LAPAROSCOPIC - ROBOTIC PARTIAL COLECTOMY   Anesthesia type: General   Diagnosis: Mass of colon [K63.89]   Pre-op diagnosis: COLON MASS, CECUM   Location: [facility] ROOM 02 / [facility]   Surgeons: Debby Hila, MD       DISCUSSION:63 y.o. former smoker with h/o HTN, a-flutter s/p ablation 2023, moderate AV stenosis (mean gradient 28 mmHg, peak gradient 47.3 mmHg, VTI 1.31 cm2), liver cirrhosis, DM II, colon mass scheduled for above procedure 04/12/24 with Dr. Hila Debby.   S/p L3-L4 lumbar fusion.   Pt seen by cardiology 02/27/24 for preoperative evaluation.  Per OV note, -Revised cardiac risk index 1 (intraperitoneal) -DASI score suggests 8.97 METS -Given known moderate aortic stenosis noted on echocardiogram 11/04/22, recommend using cardiac anesthesia, avoid tachycardia and hypotension peri-operatively, obtain updated Echo before surgery  VS: BP 139/78   Pulse 65   Temp 36.8 C (Oral)   Resp 16   Ht 6' (1.829 m)   Wt 117.9 kg   SpO2 97%   BMI 35.26 kg/m   PROVIDERS: Barbra Odor, NP is PCP   Cardiologist - Annabella Scarce MD  Electrophys.- Dr. Danelle Birmingham LABS: Labs reviewed: Acceptable for surgery. (all labs ordered are listed, but only abnormal results are displayed)  Labs Reviewed  BASIC METABOLIC PANEL WITH GFR - Abnormal; Notable for the following components:      Result Value   Glucose, Bld 116 (*)    All other components within normal limits  CBC - Abnormal; Notable for the following components:   RDW 16.2 (*)    All other components within normal limits  GLUCOSE, CAPILLARY - Abnormal; Notable for the following components:   Glucose-Capillary 119 (*)    All other components within normal limits     IMAGES:   EKG:   CV: Echo 03/01/24 1. Left ventricular ejection fraction, by estimation, is 60 to 65%. Left  ventricular ejection  fraction by 3D volume is 60 %. The left ventricle has  normal function. The left ventricle has no regional wall motion  abnormalities. Left ventricular diastolic   parameters are indeterminate. The average left ventricular global  longitudinal strain is -20.6 %. The global longitudinal strain is normal.   2. Right ventricular systolic function is normal. The right ventricular  size is normal. Tricuspid regurgitation signal is inadequate for assessing  PA pressure.   3. The mitral valve is degenerative. Trivial mitral valve regurgitation.  No evidence of mitral stenosis. Moderate mitral annular calcification.   4. The aortic valve is calcified. There is moderate calcification of the  aortic valve. There is moderate thickening of the aortic valve. Aortic  valve regurgitation is mild to moderate. Moderate aortic valve stenosis.  Aortic valve area, by VTI measures  1.06 cm. Aortic valve mean gradient measures 33.0 mmHg. Aortic valve Vmax  measures 3.74 m/s.   5. Aortic dilatation noted. There is mild dilatation of the aortic root,  measuring 40 mm.   6. The inferior vena cava is normal in size with greater than 50%  respiratory variability, suggesting right atrial pressure of 3 mmHg.  Past Medical History:  Diagnosis Date   Anemia    Arthritis    Diabetes mellitus without complication (HCC)    Type II   Dysrhythmia    aFlutter   had ablation 09-29-2021   GERD (gastroesophageal reflux disease)    Gout    HLD (hyperlipidemia)  Hypertension    Liver cirrhosis [facility])     Past Surgical History:  Procedure Laterality Date   A-FLUTTER ABLATION N/A 08/02/2021   Procedure: A-FLUTTER ABLATION;  Surgeon: Waddell Danelle ORN, MD;  Location: [facility] INVASIVE CV LAB;  Service: Cardiovascular;  Laterality: N/A;   A-FLUTTER ABLATION N/A 09/29/2021   Procedure: A-FLUTTER ABLATION;  Surgeon: Waddell Danelle ORN, MD;  Location: [facility] INVASIVE CV LAB;  Service: Cardiovascular;  Laterality: N/A;   ANTERIOR CRUCIATE  LIGAMENT REPAIR Right    x 2   HERNIA REPAIR  2010   umbilical hernia repaired with mesh   LUMBAR FUSION     L3-L4   ROTATOR CUFF REPAIR Right     MEDICATIONS:  atorvastatin (LIPITOR) 20 MG tablet   Blood Glucose Monitoring Suppl (TRUE METRIX AIR GLUCOSE METER) DEVI   colchicine 0.6 MG tablet   FEROSUL 325 (65 Fe) MG tablet   flecainide  (TAMBOCOR ) 100 MG tablet   metFORMIN  (GLUCOPHAGE ) 500 MG tablet   metoprolol  succinate (TOPROL -XL) 50 MG 24 hr tablet   metroNIDAZOLE  (FLAGYL ) 500 MG tablet   neomycin (MYCIFRADIN) 500 MG tablet   polyethylene glycol powder (GLYCOLAX /MIRALAX ) 17 GM/SCOOP powder   tadalafil (CIALIS) 20 MG tablet   No current facility-administered medications for this encounter.    Harlene Hoots Ward, PA-C [facility] Pre-Surgical Testing [phone]

## 2024-04-03 NOTE — Anesthesia Preprocedure Evaluation (Addendum)
 Anesthesia Evaluation  Patient identified by MRN, date of birth, ID band Patient awake    Reviewed: Allergy & Precautions, NPO status , Patient's Chart, lab work & pertinent test results, reviewed documented beta blocker date and time   Airway Mallampati: I  TM Distance: >3 FB Neck ROM: Full    Dental  (+) Edentulous Upper, Dental Advisory Given, Missing, Poor Dentition   Pulmonary former smoker   Pulmonary exam normal breath sounds clear to auscultation       Cardiovascular hypertension, Pt. on home beta blockers and Pt. on medications Normal cardiovascular exam+ dysrhythmias Atrial Fibrillation + Valvular Problems/Murmurs (mod AS, mild/mod AI) AS and AI  Rhythm:Regular Rate:Normal  Echo 03/01/24 1. Left ventricular ejection fraction, by estimation, is 60 to 65%. Left  ventricular ejection fraction by 3D volume is 60 %. The left ventricle has  normal function. The left ventricle has no regional wall motion  abnormalities. Left ventricular diastolic   parameters are indeterminate. The average left ventricular global  longitudinal strain is -20.6 %. The global longitudinal strain is normal.   2. Right ventricular systolic function is normal. The right ventricular  size is normal. Tricuspid regurgitation signal is inadequate for assessing  PA pressure.   3. The mitral valve is degenerative. Trivial mitral valve regurgitation.  No evidence of mitral stenosis. Moderate mitral annular calcification.   4. The aortic valve is calcified. There is moderate calcification of the  aortic valve. There is moderate thickening of the aortic valve. Aortic  valve regurgitation is mild to moderate. Moderate aortic valve stenosis.  Aortic valve area, by VTI measures  1.06 cm. Aortic valve mean gradient measures 33.0 mmHg. Aortic valve Vmax  measures 3.74 m/s.   5. Aortic dilatation noted. There is mild dilatation of the aortic root,  measuring 40  mm.   6. The inferior vena cava is normal in size with greater than 50%  respiratory variability, suggesting right atrial pressure of 3 mmHg.     Neuro/Psych negative neurological ROS  negative psych ROS   GI/Hepatic ,GERD  ,,(+) Cirrhosis         Endo/Other  diabetes, Type 2    Renal/GU negative Renal ROS  negative genitourinary   Musculoskeletal  (+) Arthritis ,    Abdominal   Peds  Hematology negative hematology ROS (+)   Anesthesia Other Findings   Reproductive/Obstetrics                              Anesthesia Physical Anesthesia Plan  ASA: 3  Anesthesia Plan: General   Post-op Pain Management: Tylenol  PO (pre-op)* and Dilaudid  IV   Induction: Intravenous  PONV Risk Score and Plan: 2 and Midazolam , Dexamethasone  and Ondansetron   Airway Management Planned: Oral ETT  Additional Equipment:   Intra-op Plan:   Post-operative Plan: Extubation in OR  Informed Consent: I have reviewed the patients History and Physical, chart, labs and discussed the procedure including the risks, benefits and alternatives for the proposed anesthesia with the patient or authorized representative who has indicated his/her understanding and acceptance.     Dental advisory given  Plan Discussed with: CRNA  Anesthesia Plan Comments: (See PAT note 04/02/24  2 IVs)         Anesthesia Quick Evaluation

## 2024-04-12 ENCOUNTER — Inpatient Hospital Stay (HOSPITAL_COMMUNITY): Payer: Self-pay | Admitting: Medical

## 2024-04-12 ENCOUNTER — Other Ambulatory Visit: Payer: Self-pay

## 2024-04-12 ENCOUNTER — Encounter (HOSPITAL_COMMUNITY)
Admission: RE | Disposition: A | Discharge status: Home / Self Care | Payer: Self-pay | Source: Ambulatory Visit | Attending: General Surgery

## 2024-04-12 ENCOUNTER — Inpatient Hospital Stay (HOSPITAL_COMMUNITY)
Admission: RE | Admit: 2024-04-12 | Discharge: 2024-04-13 | DRG: 330 | Disposition: A | Discharge status: Home / Self Care | Source: Ambulatory Visit | Attending: General Surgery | Admitting: General Surgery

## 2024-04-12 ENCOUNTER — Inpatient Hospital Stay (HOSPITAL_COMMUNITY): Payer: Self-pay | Admitting: Certified Registered Nurse Anesthetist

## 2024-04-12 ENCOUNTER — Encounter (HOSPITAL_COMMUNITY): Payer: Self-pay | Admitting: General Surgery

## 2024-04-12 DIAGNOSIS — K7460 Unspecified cirrhosis of liver: Secondary | ICD-10-CM | POA: Diagnosis present

## 2024-04-12 DIAGNOSIS — I10 Essential (primary) hypertension: Secondary | ICD-10-CM | POA: Diagnosis present

## 2024-04-12 DIAGNOSIS — K635 Polyp of colon: Secondary | ICD-10-CM | POA: Diagnosis present

## 2024-04-12 DIAGNOSIS — Z87891 Personal history of nicotine dependence: Secondary | ICD-10-CM | POA: Diagnosis not present

## 2024-04-12 DIAGNOSIS — Z8249 Family history of ischemic heart disease and other diseases of the circulatory system: Secondary | ICD-10-CM

## 2024-04-12 DIAGNOSIS — K6289 Other specified diseases of anus and rectum: Secondary | ICD-10-CM | POA: Diagnosis not present

## 2024-04-12 DIAGNOSIS — Z79899 Other long term (current) drug therapy: Secondary | ICD-10-CM

## 2024-04-12 DIAGNOSIS — E119 Type 2 diabetes mellitus without complications: Secondary | ICD-10-CM | POA: Diagnosis present

## 2024-04-12 DIAGNOSIS — C772 Secondary and unspecified malignant neoplasm of intra-abdominal lymph nodes: Secondary | ICD-10-CM | POA: Diagnosis present

## 2024-04-12 DIAGNOSIS — C182 Malignant neoplasm of ascending colon: Secondary | ICD-10-CM | POA: Diagnosis present

## 2024-04-12 DIAGNOSIS — M109 Gout, unspecified: Secondary | ICD-10-CM | POA: Diagnosis present

## 2024-04-12 DIAGNOSIS — I4891 Unspecified atrial fibrillation: Secondary | ICD-10-CM | POA: Diagnosis not present

## 2024-04-12 DIAGNOSIS — Z7984 Long term (current) use of oral hypoglycemic drugs: Secondary | ICD-10-CM | POA: Diagnosis not present

## 2024-04-12 DIAGNOSIS — I4892 Unspecified atrial flutter: Secondary | ICD-10-CM | POA: Diagnosis present

## 2024-04-12 DIAGNOSIS — E785 Hyperlipidemia, unspecified: Secondary | ICD-10-CM | POA: Diagnosis present

## 2024-04-12 DIAGNOSIS — K6389 Other specified diseases of intestine: Secondary | ICD-10-CM | POA: Diagnosis present

## 2024-04-12 DIAGNOSIS — Z01818 Encounter for other preprocedural examination: Secondary | ICD-10-CM

## 2024-04-12 LAB — TYPE AND SCREEN
ABO/RH(D): O POS
Antibody Screen: NEGATIVE

## 2024-04-12 LAB — GLUCOSE, CAPILLARY
Glucose-Capillary: 132 mg/dL — ABNORMAL HIGH (ref 70–99)
Glucose-Capillary: 184 mg/dL — ABNORMAL HIGH (ref 70–99)
Glucose-Capillary: 253 mg/dL — ABNORMAL HIGH (ref 70–99)
Glucose-Capillary: 277 mg/dL — ABNORMAL HIGH (ref 70–99)

## 2024-04-12 SURGERY — COLECTOMY, PARTIAL, ROBOT-ASSISTED, LAPAROSCOPIC
Anesthesia: General

## 2024-04-12 MED ORDER — LACTATED RINGERS IV SOLN: INTRAVENOUS | Status: DC

## 2024-04-12 MED ORDER — ATORVASTATIN CALCIUM 20 MG PO TABS
20.0000 mg | ORAL_TABLET | Freq: Every day | ORAL | Status: DC
  Administered 2024-04-13: 20 mg via ORAL
  Filled 2024-04-12: qty 1

## 2024-04-12 MED ORDER — FENTANYL CITRATE PF 50 MCG/ML IJ SOSY
PREFILLED_SYRINGE | INTRAMUSCULAR | Status: AC
  Filled 2024-04-12: qty 2

## 2024-04-12 MED ORDER — ENSURE PRE-SURGERY PO LIQD
592.0000 mL | Freq: Once | ORAL | Status: DC
  Filled 2024-04-12: qty 592

## 2024-04-12 MED ORDER — ORAL CARE MOUTH RINSE: 15.0000 mL | Freq: Once | OROMUCOSAL | Status: AC

## 2024-04-12 MED ORDER — SODIUM CHLORIDE 0.9 % IV SOLN
2.0000 g | Freq: Two times a day (BID) | INTRAVENOUS | Status: AC
Start: 2024-04-13 — End: 2024-04-12
  Administered 2024-04-12: 2 g via INTRAVENOUS
  Filled 2024-04-12: qty 2

## 2024-04-12 MED ORDER — KCL IN DEXTROSE-NACL 20-5-0.45 MEQ/L-%-% IV SOLN
INTRAVENOUS | Status: AC
  Filled 2024-04-12 (×2): qty 1000

## 2024-04-12 MED ORDER — AMISULPRIDE (ANTIEMETIC) 5 MG/2ML IV SOLN: 10.0000 mg | Freq: Once | INTRAVENOUS | Status: DC | PRN

## 2024-04-12 MED ORDER — 0.9 % SODIUM CHLORIDE (POUR BTL) OPTIME
TOPICAL | Status: DC | PRN
  Administered 2024-04-12: 1000 mL

## 2024-04-12 MED ORDER — OXYCODONE HCL 5 MG/5ML PO SOLN: 5.0000 mg | Freq: Once | ORAL | Status: DC | PRN

## 2024-04-12 MED ORDER — ONDANSETRON HCL 4 MG/2ML IJ SOLN
INTRAMUSCULAR | Status: DC | PRN
  Administered 2024-04-12: 4 mg via INTRAVENOUS

## 2024-04-12 MED ORDER — LACTATED RINGERS IV SOLN: INTRAVENOUS | Status: DC | PRN

## 2024-04-12 MED ORDER — ONDANSETRON HCL 4 MG/2ML IJ SOLN: 4.0000 mg | Freq: Four times a day (QID) | INTRAMUSCULAR | Status: DC | PRN

## 2024-04-12 MED ORDER — POLYETHYLENE GLYCOL 3350 17 GM/SCOOP PO POWD
238.0000 g | Freq: Once | ORAL | Status: DC
  Filled 2024-04-12: qty 238

## 2024-04-12 MED ORDER — LACTATED RINGERS IR SOLN
Status: DC | PRN
  Administered 2024-04-12: 1000 mL

## 2024-04-12 MED ORDER — PROPOFOL 10 MG/ML IV BOLUS
INTRAVENOUS | Status: DC | PRN
  Administered 2024-04-12: 120 mg via INTRAVENOUS

## 2024-04-12 MED ORDER — ROCURONIUM BROMIDE 10 MG/ML (PF) SYRINGE
PREFILLED_SYRINGE | INTRAVENOUS | Status: DC | PRN
  Administered 2024-04-12: 30 mg via INTRAVENOUS
  Administered 2024-04-12: 20 mg via INTRAVENOUS
  Administered 2024-04-12: 50 mg via INTRAVENOUS
  Administered 2024-04-12: 20 mg via INTRAVENOUS

## 2024-04-12 MED ORDER — METFORMIN HCL 500 MG PO TABS
500.0000 mg | ORAL_TABLET | Freq: Every day | ORAL | Status: DC
Start: 2024-04-12 — End: 2024-04-13
  Administered 2024-04-12: 500 mg via ORAL
  Filled 2024-04-12: qty 1

## 2024-04-12 MED ORDER — COLCHICINE 0.6 MG PO TABS: 0.6000 mg | ORAL_TABLET | ORAL | Status: DC | PRN

## 2024-04-12 MED ORDER — DIPHENHYDRAMINE HCL 50 MG/ML IJ SOLN
12.5000 mg | Freq: Four times a day (QID) | INTRAMUSCULAR | Status: DC | PRN
  Administered 2024-04-12: 12.5 mg via INTRAVENOUS
  Filled 2024-04-12: qty 1

## 2024-04-12 MED ORDER — GABAPENTIN 300 MG PO CAPS
300.0000 mg | ORAL_CAPSULE | ORAL | Status: AC
Start: 2024-04-12 — End: 2024-04-12
  Administered 2024-04-12: 300 mg via ORAL
  Filled 2024-04-12: qty 1

## 2024-04-12 MED ORDER — CHLORHEXIDINE GLUCONATE 0.12 % MT SOLN
15.0000 mL | Freq: Once | OROMUCOSAL | Status: AC
  Administered 2024-04-12: 15 mL via OROMUCOSAL

## 2024-04-12 MED ORDER — SUGAMMADEX SODIUM 200 MG/2ML IV SOLN
INTRAVENOUS | Status: DC | PRN
  Administered 2024-04-12: 450 mg via INTRAVENOUS

## 2024-04-12 MED ORDER — FENTANYL CITRATE PF 50 MCG/ML IJ SOSY
PREFILLED_SYRINGE | INTRAMUSCULAR | Status: AC
  Filled 2024-04-12: qty 1

## 2024-04-12 MED ORDER — ALUM & MAG HYDROXIDE-SIMETH 200-200-20 MG/5ML PO SUSP: 30.0000 mL | Freq: Four times a day (QID) | ORAL | Status: DC | PRN

## 2024-04-12 MED ORDER — ENSURE SURGERY PO LIQD
237.0000 mL | Freq: Two times a day (BID) | ORAL | Status: DC
  Administered 2024-04-13: 237 mL via ORAL

## 2024-04-12 MED ORDER — ALBUTEROL SULFATE HFA 108 (90 BASE) MCG/ACT IN AERS
INHALATION_SPRAY | RESPIRATORY_TRACT | Status: DC | PRN
Start: 2024-04-12 — End: 2024-04-12
  Administered 2024-04-12: 2 via RESPIRATORY_TRACT

## 2024-04-12 MED ORDER — FENTANYL CITRATE (PF) 100 MCG/2ML IJ SOLN
INTRAMUSCULAR | Status: DC | PRN
  Administered 2024-04-12: 25 ug via INTRAVENOUS
  Administered 2024-04-12 (×2): 50 ug via INTRAVENOUS
  Administered 2024-04-12: 25 ug via INTRAVENOUS
  Administered 2024-04-12: 50 ug via INTRAVENOUS

## 2024-04-12 MED ORDER — ALVIMOPAN 12 MG PO CAPS
12.0000 mg | ORAL_CAPSULE | Freq: Two times a day (BID) | ORAL | Status: DC
  Filled 2024-04-12: qty 1

## 2024-04-12 MED ORDER — ENOXAPARIN SODIUM 40 MG/0.4ML IJ SOSY
40.0000 mg | PREFILLED_SYRINGE | INTRAMUSCULAR | Status: DC
  Administered 2024-04-13: 40 mg via SUBCUTANEOUS
  Filled 2024-04-12: qty 0.4

## 2024-04-12 MED ORDER — SIMETHICONE 80 MG PO CHEW: 40.0000 mg | CHEWABLE_TABLET | Freq: Four times a day (QID) | ORAL | Status: DC | PRN

## 2024-04-12 MED ORDER — LIDOCAINE HCL (PF) 2 % IJ SOLN
INTRAMUSCULAR | Status: AC
  Filled 2024-04-12: qty 5

## 2024-04-12 MED ORDER — BUPIVACAINE-EPINEPHRINE 0.25% -1:200000 IJ SOLN
INTRAMUSCULAR | Status: DC | PRN
  Administered 2024-04-12: 60 mL

## 2024-04-12 MED ORDER — TRAMADOL HCL 50 MG PO TABS
50.0000 mg | ORAL_TABLET | Freq: Four times a day (QID) | ORAL | Status: DC | PRN
  Administered 2024-04-12 – 2024-04-13 (×3): 50 mg via ORAL
  Filled 2024-04-12 (×3): qty 1

## 2024-04-12 MED ORDER — SODIUM CHLORIDE 0.9 % IV SOLN
2.0000 g | INTRAVENOUS | Status: AC
  Administered 2024-04-12: 2 g via INTRAVENOUS
  Filled 2024-04-12: qty 2

## 2024-04-12 MED ORDER — ROCURONIUM BROMIDE 10 MG/ML (PF) SYRINGE
PREFILLED_SYRINGE | INTRAVENOUS | Status: AC
Start: 2024-04-12 — End: 2024-04-12
  Filled 2024-04-12: qty 10

## 2024-04-12 MED ORDER — ACETAMINOPHEN 500 MG PO TABS
1000.0000 mg | ORAL_TABLET | ORAL | Status: AC
  Filled 2024-04-12: qty 2

## 2024-04-12 MED ORDER — INSULIN ASPART 100 UNIT/ML IJ SOLN
0.0000 [IU] | Freq: Every day | INTRAMUSCULAR | Status: DC
  Administered 2024-04-12: 3 [IU] via SUBCUTANEOUS

## 2024-04-12 MED ORDER — PHENYLEPHRINE HCL-NACL 20-0.9 MG/250ML-% IV SOLN
INTRAVENOUS | Status: DC | PRN
  Administered 2024-04-12: 25 ug/min via INTRAVENOUS

## 2024-04-12 MED ORDER — METOPROLOL SUCCINATE ER 50 MG PO TB24
50.0000 mg | ORAL_TABLET | Freq: Two times a day (BID) | ORAL | Status: DC
  Administered 2024-04-12 – 2024-04-13 (×2): 50 mg via ORAL
  Filled 2024-04-12 (×2): qty 1

## 2024-04-12 MED ORDER — OXYCODONE HCL 5 MG PO TABS: 5.0000 mg | ORAL_TABLET | Freq: Once | ORAL | Status: DC | PRN

## 2024-04-12 MED ORDER — INSULIN ASPART 100 UNIT/ML IJ SOLN
0.0000 [IU] | Freq: Three times a day (TID) | INTRAMUSCULAR | Status: DC
  Administered 2024-04-12: 11 [IU] via SUBCUTANEOUS
  Administered 2024-04-13: 4 [IU] via SUBCUTANEOUS
  Administered 2024-04-13: 3 [IU] via SUBCUTANEOUS

## 2024-04-12 MED ORDER — PROPOFOL 10 MG/ML IV BOLUS
INTRAVENOUS | Status: AC
  Filled 2024-04-12: qty 20

## 2024-04-12 MED ORDER — SACCHAROMYCES BOULARDII 250 MG PO CAPS
250.0000 mg | ORAL_CAPSULE | Freq: Two times a day (BID) | ORAL | Status: DC
  Administered 2024-04-12 – 2024-04-13 (×2): 250 mg via ORAL
  Filled 2024-04-12 (×2): qty 1

## 2024-04-12 MED ORDER — DIPHENHYDRAMINE HCL 12.5 MG/5ML PO ELIX: 12.5000 mg | ORAL_SOLUTION | Freq: Four times a day (QID) | ORAL | Status: DC | PRN

## 2024-04-12 MED ORDER — MIDAZOLAM HCL 5 MG/5ML IJ SOLN
INTRAMUSCULAR | Status: DC | PRN
  Administered 2024-04-12: 2 mg via INTRAVENOUS

## 2024-04-12 MED ORDER — STERILE WATER FOR INJECTION IJ SOLN
INTRAMUSCULAR | Status: DC | PRN
  Administered 2024-04-12: 1 mL via INTRAVENOUS

## 2024-04-12 MED ORDER — FENTANYL CITRATE PF 50 MCG/ML IJ SOSY
25.0000 ug | PREFILLED_SYRINGE | INTRAMUSCULAR | Status: DC | PRN
  Administered 2024-04-12 (×2): 50 ug via INTRAVENOUS

## 2024-04-12 MED ORDER — LIDOCAINE HCL (CARDIAC) PF 100 MG/5ML IV SOSY
PREFILLED_SYRINGE | INTRAVENOUS | Status: DC | PRN
  Administered 2024-04-12: 100 mg via INTRATRACHEAL

## 2024-04-12 MED ORDER — HYDROMORPHONE HCL 1 MG/ML IJ SOLN
0.5000 mg | INTRAMUSCULAR | Status: DC | PRN
  Administered 2024-04-12 (×2): 0.5 mg via INTRAVENOUS
  Filled 2024-04-12 (×2): qty 0.5

## 2024-04-12 MED ORDER — GABAPENTIN 100 MG PO CAPS
300.0000 mg | ORAL_CAPSULE | Freq: Two times a day (BID) | ORAL | Status: DC
  Administered 2024-04-12 – 2024-04-13 (×2): 300 mg via ORAL
  Filled 2024-04-12 (×2): qty 3

## 2024-04-12 MED ORDER — MIDAZOLAM HCL 2 MG/2ML IJ SOLN
INTRAMUSCULAR | Status: AC
  Filled 2024-04-12: qty 2

## 2024-04-12 MED ORDER — BISACODYL 5 MG PO TBEC: 20.0000 mg | DELAYED_RELEASE_TABLET | Freq: Once | ORAL | Status: DC

## 2024-04-12 MED ORDER — ALVIMOPAN 12 MG PO CAPS
12.0000 mg | ORAL_CAPSULE | ORAL | Status: AC
  Administered 2024-04-12: 12 mg via ORAL
  Filled 2024-04-12: qty 1

## 2024-04-12 MED ORDER — FENTANYL CITRATE (PF) 100 MCG/2ML IJ SOLN
INTRAMUSCULAR | Status: AC
  Filled 2024-04-12: qty 2

## 2024-04-12 MED ORDER — ENSURE PRE-SURGERY PO LIQD
296.0000 mL | Freq: Once | ORAL | Status: DC
Start: 2024-04-12 — End: 2024-04-12
  Filled 2024-04-12: qty 296

## 2024-04-12 MED ORDER — BUPIVACAINE-EPINEPHRINE (PF) 0.25% -1:200000 IJ SOLN
INTRAMUSCULAR | Status: AC
  Filled 2024-04-12: qty 30

## 2024-04-12 MED ORDER — FLECAINIDE ACETATE 50 MG PO TABS
100.0000 mg | ORAL_TABLET | Freq: Two times a day (BID) | ORAL | Status: DC
  Administered 2024-04-12 – 2024-04-13 (×2): 100 mg via ORAL
  Filled 2024-04-12 (×2): qty 2

## 2024-04-12 MED ORDER — PHENYLEPHRINE 80 MCG/ML (10ML) SYRINGE FOR IV PUSH (FOR BLOOD PRESSURE SUPPORT)
PREFILLED_SYRINGE | INTRAVENOUS | Status: DC | PRN
  Administered 2024-04-12: 80 ug via INTRAVENOUS
  Administered 2024-04-12: 160 ug via INTRAVENOUS
  Administered 2024-04-12 (×4): 80 ug via INTRAVENOUS

## 2024-04-12 MED ORDER — ONDANSETRON HCL 4 MG PO TABS: 4.0000 mg | ORAL_TABLET | Freq: Four times a day (QID) | ORAL | Status: DC | PRN

## 2024-04-12 MED ORDER — DEXAMETHASONE SODIUM PHOSPHATE 4 MG/ML IJ SOLN
INTRAMUSCULAR | Status: DC | PRN
  Administered 2024-04-12: 10 mg via INTRAVENOUS

## 2024-04-12 MED ORDER — ACETAMINOPHEN 500 MG PO TABS
1000.0000 mg | ORAL_TABLET | Freq: Four times a day (QID) | ORAL | Status: DC
  Administered 2024-04-12 – 2024-04-13 (×4): 1000 mg via ORAL
  Filled 2024-04-12 (×4): qty 2

## 2024-04-12 MED ORDER — HEPARIN SODIUM (PORCINE) 5000 UNIT/ML IJ SOLN
5000.0000 [IU] | Freq: Once | INTRAMUSCULAR | Status: AC
  Administered 2024-04-12: 5000 [IU] via SUBCUTANEOUS
  Filled 2024-04-12: qty 1

## 2024-04-12 MED ORDER — ACETAMINOPHEN 500 MG PO TABS
1000.0000 mg | ORAL_TABLET | Freq: Once | ORAL | Status: AC
  Administered 2024-04-12: 1000 mg via ORAL

## 2024-04-12 MED ORDER — METFORMIN HCL 500 MG PO TABS
1000.0000 mg | ORAL_TABLET | Freq: Every day | ORAL | Status: DC
Start: 2024-04-13 — End: 2024-04-13
  Administered 2024-04-13: 1000 mg via ORAL
  Filled 2024-04-12: qty 2

## 2024-04-12 MED ORDER — INSULIN ASPART 100 UNIT/ML IJ SOLN: 0.0000 [IU] | INTRAMUSCULAR | Status: DC | PRN

## 2024-04-12 SURGICAL SUPPLY — 69 items
BAG COUNTER SPONGE SURGICOUNT (BAG) ×1 IMPLANT
BLADE EXTENDED COATED 6.5IN (ELECTRODE) IMPLANT
CANNULA REDUCER 12-8 DVNC XI (CANNULA) IMPLANT
CELLS DAT CNTRL 66122 CELL SVR (MISCELLANEOUS) IMPLANT
COVER SURGICAL LIGHT HANDLE (MISCELLANEOUS) ×2 IMPLANT
COVER TIP SHEARS 8 DVNC (MISCELLANEOUS) ×1 IMPLANT
DEFOGGER SCOPE WARM SEASHARP (MISCELLANEOUS) ×1 IMPLANT
DERMABOND ADVANCED .7 DNX12 (GAUZE/BANDAGES/DRESSINGS) IMPLANT
DRAIN CHANNEL 19F RND (DRAIN) IMPLANT
DRAPE ARM DVNC X/XI (DISPOSABLE) ×4 IMPLANT
DRAPE COLUMN DVNC XI (DISPOSABLE) ×1 IMPLANT
DRAPE CV SPLIT W-CLR ANES SCRN (DRAPES) ×1 IMPLANT
DRAPE PERI GROIN 82X75IN TIB (DRAPES) ×1 IMPLANT
DRAPE SURG IRRIG POUCH 19X23 (DRAPES) ×1 IMPLANT
DRIVER NDL LRG 8 DVNC XI (INSTRUMENTS) ×1 IMPLANT
DRIVER NDLE LRG 8 DVNC XI (INSTRUMENTS) ×1 IMPLANT
DRSG OPSITE POSTOP 4X6 (GAUZE/BANDAGES/DRESSINGS) IMPLANT
DRSG OPSITE POSTOP 4X8 (GAUZE/BANDAGES/DRESSINGS) IMPLANT
ELECT PENCIL ROCKER SW 15FT (MISCELLANEOUS) ×1 IMPLANT
ELECT REM PT RETURN 15FT ADLT (MISCELLANEOUS) ×1 IMPLANT
EVACUATOR SILICONE 100CC (DRAIN) IMPLANT
FORCEPS BPLR FENES DVNC XI (FORCEP) IMPLANT
GLOVE BIO SURGEON STRL SZ 6.5 (GLOVE) ×3 IMPLANT
GLOVE INDICATOR 6.5 STRL GRN (GLOVE) ×3 IMPLANT
GOWN SRG XL LVL 4 BRTHBL STRL (GOWNS) ×1 IMPLANT
GOWN STRL REUS W/ TWL XL LVL3 (GOWN DISPOSABLE) ×3 IMPLANT
GRASPER SUT TROCAR 14GX15 (MISCELLANEOUS) IMPLANT
GRASPER TIP-UP FEN DVNC XI (INSTRUMENTS) ×1 IMPLANT
HOLDER FOLEY CATH W/STRAP (MISCELLANEOUS) ×1 IMPLANT
IRRIGATION SUCT STRKRFLW 2 WTP (MISCELLANEOUS) ×1 IMPLANT
KIT PROCEDURE DVNC SI (MISCELLANEOUS) IMPLANT
KIT TURNOVER KIT A (KITS) ×1 IMPLANT
NDL INSUFFLATION 14GA 120MM (NEEDLE) ×1 IMPLANT
NEEDLE INSUFFLATION 14GA 120MM (NEEDLE) ×1 IMPLANT
PACK COLON (CUSTOM PROCEDURE TRAY) ×1 IMPLANT
PAD POSITIONING PINK XL (MISCELLANEOUS) ×1 IMPLANT
RELOAD STAPLE 60 2.5 WHT DVNC (STAPLE) IMPLANT
RELOAD STAPLE 60 3.5 BLU DVNC (STAPLE) IMPLANT
RELOAD STAPLE 60 4.3 GRN DVNC (STAPLE) IMPLANT
RETRACTOR WND ALEXIS 18 MED (MISCELLANEOUS) IMPLANT
SCISSORS LAP 5X35 DISP (ENDOMECHANICALS) IMPLANT
SCISSORS MNPLR CVD DVNC XI (INSTRUMENTS) ×1 IMPLANT
SEAL UNIV 5-12 XI (MISCELLANEOUS) ×3 IMPLANT
SEALER VESSEL EXT DVNC XI (MISCELLANEOUS) ×1 IMPLANT
SOLUTION ELECTROSURG ANTI STCK (MISCELLANEOUS) ×1 IMPLANT
SPIKE FLUID TRANSFER (MISCELLANEOUS) IMPLANT
STAPLER 60 SUREFORM DVNC (STAPLE) IMPLANT
STAPLER ECHELON POWER CIR 29 (STAPLE) IMPLANT
STAPLER ECHELON POWER CIR 31 (STAPLE) IMPLANT
SUT ETHILON 2 0 PS N (SUTURE) IMPLANT
SUT NOVA NAB GS-21 1 T12 (SUTURE) ×2 IMPLANT
SUT PROLENE 2 0 KS (SUTURE) IMPLANT
SUT SILK 2 0 SH CR/8 (SUTURE) IMPLANT
SUT SILK 2-0 18XBRD TIE 12 (SUTURE) ×1 IMPLANT
SUT SILK 3 0 SH CR/8 (SUTURE) ×1 IMPLANT
SUT SILK 3-0 18XBRD TIE 12 (SUTURE) IMPLANT
SUT VIC AB 2-0 SH 18 (SUTURE) IMPLANT
SUT VIC AB 2-0 SH 27X BRD (SUTURE) IMPLANT
SUT VIC AB 3-0 SH 18 (SUTURE) IMPLANT
SUT VIC AB 4-0 PS2 27 (SUTURE) ×2 IMPLANT
SUT VICRYL 0 UR6 27IN ABS (SUTURE) ×1 IMPLANT
SUTURE V-LC BRB 180 2/0GR6GS22 (SUTURE) IMPLANT
SYR 20ML ECCENTRIC (SYRINGE) ×1 IMPLANT
SYSTEM WOUND ALEXIS 18CM MED (MISCELLANEOUS) IMPLANT
TOWEL OR 17X26 10 PK STRL BLUE (TOWEL DISPOSABLE) IMPLANT
TRAY FOLEY MTR SLVR 16FR STAT (SET/KITS/TRAYS/PACK) ×1 IMPLANT
TROCAR ADV FIXATION 5X100MM (TROCAR) ×1 IMPLANT
TUBING CONNECTING 10 (TUBING) ×2 IMPLANT
TUBING INSUFFLATION 10FT LAP (TUBING) ×1 IMPLANT

## 2024-04-12 NOTE — Transfer of Care (Signed)
 Immediate Anesthesia Transfer of Care Note  Patient: Caleb [name]  Procedure(s) Performed: ROBOTIC RIGHT COLECTOMY  Patient Location: PACU  Anesthesia Type:General  Level of Consciousness: awake and patient cooperative  Airway & Oxygen Therapy: Patient Spontanous Breathing and Patient connected to face mask oxygen  Post-op Assessment: Report given to RN and Post -op Vital signs reviewed and stable  Post vital signs: Reviewed and stable  Last Vitals:  Vitals Value Taken Time  BP    Temp    Pulse    Resp    SpO2      Last Pain:  Vitals:   04/12/24 1053  TempSrc:   PainSc: 0-No pain      Patients Stated Pain Goal: 5 (04/12/24 1041)  Complications: No notable events documented.

## 2024-04-12 NOTE — Discharge Instructions (Signed)

## 2024-04-12 NOTE — Plan of Care (Signed)
  Problem: Education: Goal: Understanding of discharge needs will improve Outcome: Progressing Goal: Verbalization of understanding of the causes of altered bowel function will improve Outcome: Progressing   Problem: Activity: Goal: Ability to tolerate increased activity will improve Outcome: Progressing

## 2024-04-12 NOTE — Anesthesia Procedure Notes (Signed)
 Procedure Name: Intubation Date/Time: 04/12/2024 12:04 PM  Performed by: Cena Epps, CRNAPre-anesthesia Checklist: Patient identified, Emergency Drugs available, Suction available and Patient being monitored Patient Re-evaluated:Patient Re-evaluated prior to induction Oxygen Delivery Method: Circle System Utilized Preoxygenation: Pre-oxygenation with 100% oxygen Induction Type: IV induction Ventilation: Mask ventilation without difficulty Laryngoscope Size: Mac and 4 Grade View: Grade I Tube type: Oral Tube size: 7.5 mm Number of attempts: 1 Airway Equipment and Method: Stylet and Oral airway Placement Confirmation: ETT inserted through vocal cords under direct vision, positive ETCO2 and breath sounds checked- equal and bilateral Secured at: 23 cm Tube secured with: Tape Dental Injury: Teeth and Oropharynx as per pre-operative assessment

## 2024-04-12 NOTE — Op Note (Signed)
 04/12/2024  2:04 PM  PATIENT:  Caleb [name]  64 y.o. male  Patient Care Team: Barbra Odor, NP as PCP - General (Nurse Practitioner) Waddell Danelle ORN, MD as PCP - Electrophysiology (Cardiology) Raford Riggs, MD as PCP - Cardiology (Cardiology) Milissa Clack, MD as Referring Physician (Physical Medicine and Rehabilitation)  PRE-OPERATIVE DIAGNOSIS:  COLON MASS, CECUM  POST-OPERATIVE DIAGNOSIS:  COLON MASS, CECUM  PROCEDURE:  ROBOTIC RIGHT COLECTOMY    Surgeon(s): Debby Hila, MD Teresa Lonni HERO, MD  ASSISTANT: Dr Teresa   ANESTHESIA:   local and general  EBL: 25ml Total I/O In: -  Out: 125 [Urine:100; Blood:25]  Delay start of Pharmacological VTE agent (>24hrs) due to surgical blood loss or risk of bleeding:  no  DRAINS: none   SPECIMEN:  Source of Specimen:  R colon  DISPOSITION OF SPECIMEN:  PATHOLOGY  COUNTS:  YES  PLAN OF CARE: Admit to inpatient   PATIENT DISPOSITION:  PACU - hemodynamically stable.  INDICATION:    63 y.o. M with cecal polyp too large to be endoscopically resected.  I recommended segmental resection:  The anatomy & physiology of the digestive tract was discussed.  The pathophysiology was discussed.  Natural history risks without surgery was discussed.   I worked to give an overview of the disease and the frequent need to have multispecialty involvement.  I feel the risks of no intervention will lead to serious problems that outweigh the operative risks; therefore, I recommended a partial colectomy to remove the pathology.  Laparoscopic & open techniques were discussed.   Risks such as bleeding, infection, abscess, leak, reoperation, possible ostomy, hernia, heart attack, death, and other risks were discussed.  I noted a good likelihood this will help address the problem.   Goals of post-operative recovery were discussed as well.    The patient expressed understanding & wished to proceed with surgery.  OR FINDINGS:    Patient had tattoo noted in his proximal transverse colon  No obvious metastatic disease on visceral parietal peritoneum or liver.  DESCRIPTION:   Informed consent was confirmed.  The patient underwent general anaesthesia without difficulty.  The patient was positioned appropriately.  VTE prevention in place.  The patient's abdomen was clipped, prepped, & draped in a sterile fashion.  Surgical timeout confirmed our plan.  The patient was positioned in reverse Trendelenburg.  Abdominal entry was gained using a Varies needle in the LUQ.  Entry was clean.  I induced carbon dioxide insufflation.  An 8mm robotic port was placed in the LUQ.  Camera inspection revealed no injury.  Extra ports were carefully placed under direct laparoscopic visualization.  I laparoscopically reflected the greater omentum and the upper abdomen the small bowel in the peilvis. The patient was appropriately positioned and the robot was docked to the patient's right side.  Instruments were placed under direct visualization.    I began by identifying the ileocolic artery and vein within the mesentery. Dissection was bluntly carried around these structures. The duodenum was identified and free from the structures. I then separated the structures bluntly and used the robotic vessel sealer device to transect these.  I developed the retroperitoneal plane bluntly.  I then freed the appendix off its attachments to the pelvic wall. I mobilized the terminal ileum.  I took care to avoid injuring any retroperitoneal structures.  After this I began to mobilize laterally down the white line of Toldt and then took down the hepatic flexure using the Enseal device. I mobilized the  omentum off of the right transverse colon. The entire colon was then flipped medially and mobilized off of the retroperitoneal structures until I could visualize the lateral edge of the duodenum underneath.  I gently freed the duodenal attachments.   I identified a  portion of mesentery of the transverse colon just proximal to the right branch of the middle colic.  I divided up to the colon from the previous dissection of the mesentery using the robotic vessel sealer.  I then divided the terminal ileal mesentery in similar fashion.  At that point, the terminal ileum was divided with a blue load robotic 60 mm stapler.  The transverse colon was divided with a green load robotic stapler.  The specimen was then completely free and placed in the right upper quadrant.  Hemostasis was good. I then oriented the remaining terminal ileum and transverse colon and a isoperistaltic fashion.  I placed an enterotomy in the small bowel and colon using the robotic scissors.  I then introduced a white load 60 mm robotic stapler into both enterotomies and created an anastomosis between the small bowel and transverse colon.  Hemostasis within the staple line was good.  The common enterotomy channel was closed using 2 running 2-0 V-Loc sutures.  The abdomen was then irrigated with normal saline. The omentum was then brought down over the anastomosis.  At this point the robot was undocked.  The 12 mm suprapubic port was enlarged to a Pfannenstiel incision and an Alexis wound protector was placed.  The specimen was removed from the abdomen and evaluated.  Once the abdomen was inspected for hemostasis, the Alexis wound protector was removed.    The peritoneum of the Pfannenstiel incision was closed using a running 0 Vicryl suture.  The fascia was then closed using 2 running #1 Novafil sutures.  The subcutaneous tissue of the extraction incision was closed using a running 2-0 Vicryl suture. The skin was then closed using running subcuticular 4-0 Vicryl sutures.  A sterile dressing was applied.  The remaining port sites were closed using interrupted 4-0 Vicryl sutures and Dermabond. All counts were correct per operating room staff. The patient was then awakened from anesthesia and sent to the post  anesthesia care unit in stable condition.   Bernarda JAYSON Ned, MD  Colorectal and General Surgery [facility] Surgery

## 2024-04-12 NOTE — H&P (Signed)
 REFERRING PHYSICIAN:  Abran Norleen LOISE Mickey., MD   PROVIDER:  BERNARDA WANDA NED, MD   MRN: [id number] DOB: 1961-04-07    Subjective   Chief Complaint: New Consultation       History of Present Illness: Caleb [name] is a 63 y.o. male who is seen today as an office consultation at the request of Dr. Abran for evaluation of New Consultation .  Patient underwent a colonoscopy due to heme positive stool and anemia on 01/22/2024.  Several polyps were removed from the rectum descending colon and transverse colon.  In the cecum there was a 4 to 5 cm polypoid mass which was biopsied.  There were also 4 pedunculated and semipedunculated polyps in the cecum.  Area was tattooed distally.  Biopsy show tubulovillous adenoma without high-grade dysplasia or carcinoma.  Remaining polyps also showed tubular adenoma without high-grade dysplasia or malignancy.  He has a history of cardiac ablation for atrial flutter and past surgical history significant for umbilical hernia repair.     Review of Systems: A complete review of systems was obtained from the patient.  I have reviewed this information and discussed as appropriate with the patient.  See HPI as well for other ROS.     Medical History: Past Medical History: DiagnosisDate            Anemia                        Hyperlipidemia                        Hypertension        There is no problem list on file for this patient.     Past Surgical History: ProcedureLateralityDate knee surgeryRight1982     No Known Allergies   Current Outpatient Medications on File Prior to Visit MedicationSigDispenseRefill            atorvastatin (LIPITOR) 20 MG tablet  Take 20 mg by mouth once daily                               ferrous sulfate  325 (65 FE) MG tablet            Take 325 mg by mouth daily with breakfast                                flecainide  (TAMBOCOR ) 100 MG tablet        Take 100 mg by mouth every 12 (twelve) hours                                     metFORMIN  (GLUCOPHAGE ) 500 MG tablet          Take 1 mg by mouth                           metoprolol  SUCCinate (TOPROL -XL) 50 MG XL tablet        Take 50 mg by mouth 2 (two) times daily                     No current facility-administered medications on file prior to visit.     Family History ProblemRelationAge of Onset  High blood pressure (Hypertension)   Father       Social History   Tobacco Use Smoking StatusFormer Current packs/day:0.00 Types:Cigarettes Quit date:2011 Years since quitting:14.5 Smokeless TobaccoNever     Social History   Socioeconomic History Marital status:Legally Separated Tobacco Use Smoking status:Former                         Current packs/day:      0.00                         Types: Cigarettes                         Quit date:        2011                         Years since quitting:   14.5 Smokeless tobacco:Never Substance and Sexual Activity Drug ldz:Wzczm   Social Drivers of Health   Housing Stability: Unknown (02/05/2024)             Housing Stability Vital Sign                        Homeless in the Last Year: No     Objective:     Vitals:   04/12/24 1041  BP: (!) 164/81  Pulse: 65  Resp: 20  Temp: 98 F (36.7 C)  SpO2: 96%    Exam Gen: NAD CV: RRR Pulm: CTA Abd: soft, umbilical scar     Assessment and Plan: Diagnoses and all orders for this visit:   Mass of colon   63 year old male with recent colonoscopy due to anemia.  He presents with a mass in the ascending colon as well is several other polyps.  Biopsy did not show any malignancy, but given the size of the mass, I recommended proceeding with a robotic assisted right colectomy.  We discussed that masses of the size can harbor  malignancies approximately 20% of the time.  We also discussed alternative treatments.  He has elected to proceed with surgical resection. The surgery and anatomy were described to the patient as well as the risks of  surgery and the possible complications.  These include: Bleeding, deep abdominal infections and possible wound complications such as hernia and infection, damage to adjacent structures, leak of surgical connections, which can lead to other surgeries and possibly an ostomy, possible need for other procedures, such as abscess drains in radiology, possible prolonged hospital stay, possible diarrhea from removal of part of the colon, possible constipation from narcotics, possible bowel, bladder or sexual dysfunction if having rectal surgery, prolonged fatigue/weakness or appetite loss, possible early recurrence of of disease, possible complications of their medical problems such as heart disease or arrhythmias or lung problems, death (less than 1%). I believe the patient understands and wishes to proceed with the surgery.     Bernarda JAYSON Ned, MD Colon and Rectal Surgery [facility] Surgery

## 2024-04-13 LAB — CBC
HCT: 39.8 % (ref 39.0–52.0)
Hemoglobin: 12.9 g/dL — ABNORMAL LOW (ref 13.0–17.0)
MCH: 30.1 pg (ref 26.0–34.0)
MCHC: 32.4 g/dL (ref 30.0–36.0)
MCV: 92.8 fL (ref 80.0–100.0)
Platelets: 127 K/uL — ABNORMAL LOW (ref 150–400)
RBC: 4.29 MIL/uL (ref 4.22–5.81)
RDW: 14.7 % (ref 11.5–15.5)
WBC: 5.6 K/uL (ref 4.0–10.5)
nRBC: 0 % (ref 0.0–0.2)

## 2024-04-13 LAB — BASIC METABOLIC PANEL WITH GFR
Anion gap: 10 (ref 5–15)
BUN: 6 mg/dL — ABNORMAL LOW (ref 8–23)
CO2: 21 mmol/L — ABNORMAL LOW (ref 22–32)
Calcium: 8.9 mg/dL (ref 8.9–10.3)
Chloride: 103 mmol/L (ref 98–111)
Creatinine, Ser: 0.82 mg/dL (ref 0.61–1.24)
GFR, Estimated: >60 mL/min (ref >60)
Glucose, Bld: 179 mg/dL — ABNORMAL HIGH (ref 70–99)
Potassium: 4.8 mmol/L (ref 3.5–5.1)
Sodium: 135 mmol/L (ref 135–145)

## 2024-04-13 LAB — GLUCOSE, CAPILLARY
Glucose-Capillary: 143 mg/dL — ABNORMAL HIGH (ref 70–99)
Glucose-Capillary: 194 mg/dL — ABNORMAL HIGH (ref 70–99)

## 2024-04-13 MED ORDER — TRAMADOL HCL 50 MG PO TABS: 50.0000 mg | ORAL_TABLET | Freq: Four times a day (QID) | ORAL | 0 refills | Status: DC | PRN

## 2024-04-13 NOTE — Progress Notes (Signed)
 Pharmacy Brief Note - Alvimopan (Entereg)  The standing order set for alvimopan (Entereg) now includes an automatic order to discontinue the drug after the patient has had a bowel movement. The change was approved by the Pharmacy & Therapeutics Committee and the Medical Executive Committee.   This patient has had bowel movements documented by nursing. Therefore, alvimopan has been discontinued. If there are questions, please contact the pharmacy at [phone].   Thank you-   Iantha Batch, PharmD, BCPS 04/13/2024 10:44 AM

## 2024-04-13 NOTE — Progress Notes (Signed)
 Patient ID: Caleb [name], male   DOB: 10/25/1960, 63 y.o.   MRN: [id number]   Pt is tolerating fulls, having bm's and pain is controlled Wants to go home  Looks good on exam Abdomen is soft and incisions are clean  Plan D/c home

## 2024-04-13 NOTE — Plan of Care (Signed)
  Problem: Education: Goal: Understanding of discharge needs will improve Outcome: Progressing   Problem: Activity: Goal: Ability to tolerate increased activity will improve Outcome: Progressing   Problem: Bowel/Gastric: Goal: Gastrointestinal status for postoperative course will improve Outcome: Progressing

## 2024-04-13 NOTE — Discharge Summary (Signed)
 Physician Discharge Summary  Patient ID: Caleb [name] MRN: [id number] DOB/AGE: 10/12/1960 63 y.o.  Admit date: 04/12/2024 Discharge date: 04/13/2024  Admission Diagnoses:  Discharge Diagnoses:  Principal Problem:   Colonic mass   Discharged Condition: good  Hospital Course: uneventful post op recovery.  Discharged home pod#1 tolerating a diet and having bm's with pain well controlled  Consults: None  Significant Diagnostic Studies:   Treatments: surgery: ROBOTIC RIGHT COLECTOMY   Discharge Exam: Blood pressure (!) 146/66, pulse (!) 56, temperature (!) 97.5 F (36.4 C), temperature source Oral, resp. rate 16, height 6' (1.829 m), weight 117.9 kg, SpO2 99%. General appearance: alert, cooperative, and no distress Resp: clear to auscultation bilaterally Cardio: regular rate and rhythm, S1, S2 normal, no murmur, click, rub or gallop Incision/Wound:abdomen soft, incisions clean  Disposition: Discharge disposition: 01-Home or Self Care        Allergies as of 04/13/2024   No Known Allergies      Medication List     TAKE these medications    atorvastatin 20 MG tablet Commonly known as: LIPITOR Take 20 mg by mouth daily.   colchicine 0.6 MG tablet Take 0.6 mg by mouth as needed (gout).   FeroSul 325 (65 Fe) MG tablet Generic drug: ferrous sulfate  Take 1 tablet by mouth once daily with breakfast   flecainide  100 MG tablet Commonly known as: TAMBOCOR  TAKE 1 TABLET BY MOUTH EVERY 12 HOURS   metFORMIN  500 MG tablet Commonly known as: GLUCOPHAGE  Take 500-1,000 mg by mouth See admin instructions. Take 1000 mg in the morning and 500 mg in the evening   metoprolol  succinate 50 MG 24 hr tablet Commonly known as: TOPROL -XL Take 1 tablet (50 mg total) by mouth 2 (two) times daily. Patient must schedule annual office visit for further refills second attempt   metroNIDAZOLE  500 MG tablet Commonly known as: FLAGYL  Take 1,000 mg by mouth once.   neomycin 500 MG  tablet Commonly known as: MYCIFRADIN Take 1,000 mg by mouth 3 (three) times daily.   polyethylene glycol powder 17 GM/SCOOP powder Commonly known as: GLYCOLAX /MIRALAX  Take 119 g by mouth once.   tadalafil 20 MG tablet Commonly known as: CIALIS Take 20 mg by mouth daily as needed for erectile dysfunction.   traMADol  50 MG tablet Commonly known as: Ultram  Take 1 tablet (50 mg total) by mouth every 6 (six) hours as needed for moderate pain (pain score 4-6) or severe pain (pain score 7-10).   True Metrix Air Glucose Meter Devi Check CBG BID        Follow-up Information     Debby Hila, MD. Schedule an appointment as soon as possible for a visit in 2 week(s).   Specialties: General Surgery, Colon and Rectal Surgery Contact information: [address] [place] [address] [phone]                 Signed: Vicenta Poli 04/13/2024, 10:49 AM

## 2024-04-15 ENCOUNTER — Other Ambulatory Visit: Payer: Self-pay | Admitting: Internal Medicine

## 2024-04-15 NOTE — Anesthesia Postprocedure Evaluation (Signed)
 Anesthesia Post Note  Patient: Caleb [name]  Procedure(s) Performed: ROBOTIC RIGHT COLECTOMY     Patient location during evaluation: PACU Anesthesia Type: General Level of consciousness: awake and alert Pain management: pain level controlled Vital Signs Assessment: post-procedure vital signs reviewed and stable Respiratory status: spontaneous breathing, nonlabored ventilation, respiratory function stable and patient connected to nasal cannula oxygen Cardiovascular status: blood pressure returned to baseline and stable Postop Assessment: no apparent nausea or vomiting Anesthetic complications: no   No notable events documented.  Last Vitals:  Vitals:   04/13/24 0513 04/13/24 0757  BP: (!) 110/59 (!) 146/66  Pulse: (!) 57 (!) 56  Resp: 18 16  Temp: 36.5 C (!) 36.4 C  SpO2: 97% 99%    Last Pain:  Vitals:   04/13/24 0840  TempSrc:   PainSc: 8                  Ruchama Kubicek L Veronia Laprise

## 2024-04-24 ENCOUNTER — Other Ambulatory Visit: Payer: Self-pay | Admitting: *Deleted

## 2024-04-24 ENCOUNTER — Other Ambulatory Visit: Payer: Self-pay | Admitting: General Surgery

## 2024-04-24 DIAGNOSIS — C182 Malignant neoplasm of ascending colon: Secondary | ICD-10-CM

## 2024-04-24 NOTE — Progress Notes (Signed)
 The proposed treatment discussed in conference is for discussion purpose only and is not a binding recommendation.  The patients have not been physically examined, or presented with their treatment options.  Therefore, final treatment plans cannot be decided.

## 2024-04-25 LAB — SURGICAL PATHOLOGY

## 2024-05-02 ENCOUNTER — Ambulatory Visit
Admission: RE | Admit: 2024-05-02 | Discharge: 2024-05-02 | Disposition: A | Discharge status: Home / Self Care | Source: Ambulatory Visit | Attending: General Surgery | Admitting: General Surgery

## 2024-05-02 DIAGNOSIS — C182 Malignant neoplasm of ascending colon: Secondary | ICD-10-CM

## 2024-05-02 MED ORDER — IOPAMIDOL (ISOVUE-300) INJECTION 61%
80.0000 mL | Freq: Once | INTRAVENOUS | Status: AC | PRN
  Administered 2024-05-02: 80 mL via INTRAVENOUS

## 2024-05-02 NOTE — Progress Notes (Addendum)
 PATIENT NAVIGATOR PROGRESS NOTE  Name: Caleb [name] Date: 05/02/2024 MRN: [id number]  DOB: 09-09-1960   Reason for visit:  Introductory Phone Call  Comments:  Called patient and explained my role in his care.  Patient is scheduled for his initial Med/Onc visit on 10/24 with Dr. Chinita Pasam.  Informed patient I would follow-up with him next week.  Patient was agreeable to have Social Work reach out.  No urgent needs were noted during assessment.   Patient was given my direct contact information and was instructed to contact office with any questions or concerns.    Time spent counseling/coordinating care: 15-30 minutes

## 2024-05-03 ENCOUNTER — Inpatient Hospital Stay (HOSPITAL_BASED_OUTPATIENT_CLINIC_OR_DEPARTMENT_OTHER): Admitting: Oncology

## 2024-05-03 ENCOUNTER — Inpatient Hospital Stay: Attending: Oncology

## 2024-05-03 ENCOUNTER — Encounter: Payer: Self-pay | Admitting: Oncology

## 2024-05-03 ENCOUNTER — Other Ambulatory Visit: Payer: Self-pay

## 2024-05-03 VITALS — BP 120/72 | HR 79 | Temp 98.3°F | Resp 17 | Ht 72.0 in | Wt 255.4 lb

## 2024-05-03 DIAGNOSIS — C180 Malignant neoplasm of cecum: Secondary | ICD-10-CM

## 2024-05-03 DIAGNOSIS — I10 Essential (primary) hypertension: Secondary | ICD-10-CM | POA: Insufficient documentation

## 2024-05-03 DIAGNOSIS — Z87891 Personal history of nicotine dependence: Secondary | ICD-10-CM | POA: Diagnosis not present

## 2024-05-03 DIAGNOSIS — Z801 Family history of malignant neoplasm of trachea, bronchus and lung: Secondary | ICD-10-CM | POA: Diagnosis not present

## 2024-05-03 DIAGNOSIS — Z79899 Other long term (current) drug therapy: Secondary | ICD-10-CM | POA: Insufficient documentation

## 2024-05-03 DIAGNOSIS — I4892 Unspecified atrial flutter: Secondary | ICD-10-CM | POA: Diagnosis not present

## 2024-05-03 DIAGNOSIS — E1159 Type 2 diabetes mellitus with other circulatory complications: Secondary | ICD-10-CM

## 2024-05-03 DIAGNOSIS — C189 Malignant neoplasm of colon, unspecified: Secondary | ICD-10-CM

## 2024-05-03 DIAGNOSIS — L853 Xerosis cutis: Secondary | ICD-10-CM | POA: Insufficient documentation

## 2024-05-03 DIAGNOSIS — C779 Secondary and unspecified malignant neoplasm of lymph node, unspecified: Secondary | ICD-10-CM | POA: Diagnosis not present

## 2024-05-03 LAB — CBC WITH DIFFERENTIAL (CANCER CENTER ONLY)
Abs Immature Granulocytes: 0.01 K/uL (ref 0.00–0.07)
Basophils Absolute: 0.1 K/uL (ref 0.0–0.1)
Basophils Relative: 1 %
Eosinophils Absolute: 0.1 K/uL (ref 0.0–0.5)
Eosinophils Relative: 1 %
HCT: 43 % (ref 39.0–52.0)
Hemoglobin: 14.4 g/dL (ref 13.0–17.0)
Immature Granulocytes: 0 %
Lymphocytes Relative: 20 %
Lymphs Abs: 1.1 K/uL (ref 0.7–4.0)
MCH: 29 pg (ref 26.0–34.0)
MCHC: 33.5 g/dL (ref 30.0–36.0)
MCV: 86.7 fL (ref 80.0–100.0)
Monocytes Absolute: 0.5 K/uL (ref 0.1–1.0)
Monocytes Relative: 10 %
Neutro Abs: 3.7 K/uL (ref 1.7–7.7)
Neutrophils Relative %: 68 %
Platelet Count: 265 K/uL (ref 150–400)
RBC: 4.96 MIL/uL (ref 4.22–5.81)
RDW: 13.8 % (ref 11.5–15.5)
WBC Count: 5.4 K/uL (ref 4.0–10.5)
nRBC: 0 % (ref 0.0–0.2)

## 2024-05-03 LAB — CMP (CANCER CENTER ONLY)
ALT: 39 U/L (ref 0–44)
AST: 38 U/L (ref 15–41)
Albumin: 4.1 g/dL (ref 3.5–5.0)
Alkaline Phosphatase: 100 U/L (ref 38–126)
Anion gap: 6 (ref 5–15)
BUN: 9 mg/dL (ref 8–23)
CO2: 29 mmol/L (ref 22–32)
Calcium: 9.3 mg/dL (ref 8.9–10.3)
Chloride: 107 mmol/L (ref 98–111)
Creatinine: 0.88 mg/dL (ref 0.61–1.24)
GFR, Estimated: >60 mL/min (ref >60)
Glucose, Bld: 88 mg/dL (ref 70–99)
Potassium: 4.7 mmol/L (ref 3.5–5.1)
Sodium: 142 mmol/L (ref 135–145)
Total Bilirubin: 0.4 mg/dL (ref 0.0–1.2)
Total Protein: 7.6 g/dL (ref 6.5–8.1)

## 2024-05-03 LAB — CEA (ACCESS): CEA (CHCC): 5.32 ng/mL — ABNORMAL HIGH (ref 0.00–5.00)

## 2024-05-03 MED ORDER — LIDOCAINE-PRILOCAINE 2.5-2.5 % EX CREA: TOPICAL_CREAM | CUTANEOUS | 3 refills | Status: AC

## 2024-05-03 MED ORDER — TRAMADOL HCL 50 MG PO TABS: 50.0000 mg | ORAL_TABLET | Freq: Four times a day (QID) | ORAL | 0 refills | Status: AC | PRN

## 2024-05-03 MED ORDER — DEXAMETHASONE 4 MG PO TABS: 8.0000 mg | ORAL_TABLET | Freq: Every day | ORAL | 1 refills | Status: AC

## 2024-05-03 MED ORDER — PROCHLORPERAZINE MALEATE 10 MG PO TABS: 10.0000 mg | ORAL_TABLET | Freq: Four times a day (QID) | ORAL | 1 refills | Status: AC | PRN

## 2024-05-03 MED ORDER — ONDANSETRON HCL 8 MG PO TABS: 8.0000 mg | ORAL_TABLET | Freq: Three times a day (TID) | ORAL | 1 refills | Status: AC | PRN

## 2024-05-03 NOTE — Progress Notes (Signed)
 START ON PATHWAY REGIMEN - Colorectal     A cycle is every 14 days:     Oxaliplatin      Leucovorin      Fluorouracil      Fluorouracil   **Always confirm dose/schedule in your pharmacy ordering system**  Patient Characteristics: Postoperative without Neoadjuvant Therapy, M0 (Pathologic Staging), Colon, Stage III, MSS/pMMR, Low Risk (pT1-3, pN1) Tumor Location: Colon Therapeutic Status: Postoperative without Neoadjuvant Therapy, M0 (Pathologic Staging) AJCC M Category: cM0 AJCC T Category: pT1 AJCC N Category: pN1b AJCC 8 Stage Grouping: IIIA Microsatellite/Mismatch Repair Status: MSS/pMMR Intent of Therapy: Curative Intent, Discussed with Patient

## 2024-05-03 NOTE — Assessment & Plan Note (Signed)
 Please review oncology history for additional details and timeline of events.  Though the biopsy from colonoscopy in July 2025 did not show evidence of malignancy, given the size of the mass and clinical picture, Dr. Debby recommended proceeding with robotic assisted right colectomy.  After obtaining cardiac clearance, Dr. Debby performed robotic right colectomy on 04/12/2024.  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  Submucosal invasion was not identified.  Metastatic adenocarcinoma was noted in 2 out of 36 lymph nodes.  Margins were negative for carcinoma or dysplasia.  No LVI, PNI. pTis,pN1b,cM0, Stage III A disease.  MMR proficient.  Staging CT chest on 05/02/2024 showed no evidence of intrathoracic metastatic disease.  Previously CT abdomen and pelvis on 01/29/2024 showed no evidence of metastatic disease.  Today I discussed staging, prognosis, plan of care, treatment options.  Reviewed NCCN guidelines.  Stage III colon cancer located in the cecum, status post resection with negative margins.   Chemotherapy is recommended to prevent recurrence due to the risk of microscopic disease. Two chemotherapy options were discussed: FOLFOX regimen with a pump every two weeks or CAPEOX regimen with oral pills every three weeks. The FOLFOX regimen is preferred initially, with the option to switch to CAPEOX if needed due to cardiac history. Common side effects of chemotherapy include nausea, vomiting, fatigue, low blood counts, tingling, numbness, cold sensitivity, and diarrhea. The goal of chemotherapy is to prevent recurrence and aim for a complete cure.  - Initiate FOLFOX chemotherapy regimen with a pump every two weeks for 3-6 months. - Arrange for port-a-cath placement for chemotherapy administration. - Conduct blood work and scans every three months for the first two years, then space out.  - Perform circulating cancer DNA test every three months.  We did  proceed with guardant reveal test today along with Guardant360 to look for any actionable mutations.  In case of PIK3CA mutation, we will start him on aspirin 100 mg daily.  - Schedule colonoscopy one year post-surgery.  - Coordinate chemotherapy education session for side effect management.  - Monitor for cardiac arrhythmias due to chemotherapy.  - Advise on dietary modifications to avoid greasy and spicy foods.  - Limit alcohol intake to two beers, not more than 1 day a week, and avoid around chemotherapy days.  Tentative start date around 05/15/2024.  We will coordinate appointments accordingly

## 2024-05-03 NOTE — Progress Notes (Signed)
 As per Dr. Autumn, order was successfully placed in portal for Guardant Reveal and Guardant 360. Paper work was taken to lab, order placed in epic and paperwork sent with portal order. Confirmation received of receipt.

## 2024-05-03 NOTE — Progress Notes (Signed)
 [facility]  ONCOLOGY CONSULT NOTE   PATIENT NAME: Caleb [name]   MR#: [id number] DOB: [birth year]  DATE OF SERVICE: 05/03/2024   REFERRING PROVIDER  [name] Ned, MD  Patient Care Team: Caleb Odor, NP as PCP - General (Nurse Practitioner) [name] ORN, MD as PCP - Electrophysiology (Cardiology) Caleb Riggs, MD as PCP - Cardiology (Cardiology) Caleb Clack, MD as Referring Physician (Physical Medicine and Rehabilitation) Caleb, Evalene LITTIE, [name] as Oncology Nurse Navigator Caleb Bernarda, MD as Consulting Physician (General Surgery)    CHIEF COMPLAINT/ PURPOSE OF CONSULTATION:   Newly diagnosed adenocarcinoma of the cecum, stage III (2 out of 36 lymph nodes positive), MMR proficient.  ASSESSMENT & PLAN:   Caleb [name] is a 63 y.o. gentleman with a past medical history of hypertension, diabetes mellitus type 2, dyslipidemia, atrial flutter status post ablation, was referred to our clinic for newly diagnosed adenocarcinoma of the cecum, stage III, MMR proficient, status post hemicolectomy on 04/12/2024.  Adenocarcinoma of cecum [facility]) Please review oncology history for additional details and timeline of events.  Though the biopsy from colonoscopy in July 2025 did not show evidence of malignancy, given the size of the mass and clinical picture, Dr. Ned recommended proceeding with robotic assisted right colectomy.  After obtaining cardiac clearance, Dr. Ned performed robotic right colectomy on 04/12/2024.  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  Submucosal invasion was not identified.  Metastatic adenocarcinoma was noted in 2 out of 36 lymph nodes.  Margins were negative for carcinoma or dysplasia.  No LVI, PNI. pTis,pN1b,cM0, Stage III A disease.  MMR proficient.  Staging CT chest on 05/02/2024 showed no evidence of intrathoracic metastatic disease.  Previously CT abdomen and pelvis on 01/29/2024  showed no evidence of metastatic disease.  Today I discussed staging, prognosis, plan of care, treatment options.  Reviewed NCCN guidelines.  Stage III colon cancer located in the cecum, status post resection with negative margins.   Chemotherapy is recommended to prevent recurrence due to the risk of microscopic disease. Two chemotherapy options were discussed: FOLFOX regimen with a pump every two weeks or CAPEOX regimen with oral pills every three weeks. The FOLFOX regimen is preferred initially, with the option to switch to CAPEOX if needed due to cardiac history. Common side effects of chemotherapy include nausea, vomiting, fatigue, low blood counts, tingling, numbness, cold sensitivity, and diarrhea. The goal of chemotherapy is to prevent recurrence and aim for a complete cure.  - Initiate FOLFOX chemotherapy regimen with a pump every two weeks for 3-6 months. - Arrange for port-a-cath placement for chemotherapy administration. - Conduct blood work and scans every three months for the first two years, then space out.  - Perform circulating cancer DNA test every three months.  We did proceed with guardant reveal test today along with Guardant360 to look for any actionable mutations.  In case of PIK3CA mutation, we will start him on aspirin 100 mg daily.  - Schedule colonoscopy one year post-surgery.  - Coordinate chemotherapy education session for side effect management.  - Monitor for cardiac arrhythmias due to chemotherapy.  - Advise on dietary modifications to avoid greasy and spicy foods.  - Limit alcohol intake to two beers, not more than 1 day a week, and avoid around chemotherapy days.  Tentative start date around 05/15/2024.  We will coordinate appointments accordingly   Atrial flutter, status post ablation, on flecainide  Atrial flutter, status post two ablations with the second being successful.  Currently on flecainide . Chemotherapy may increase the risk of cardiac  arrhythmias, particularly with the FOLFOX regimen. - Monitor cardiac status closely during chemotherapy. - Consider switching to CAPOX regimen if cardiac issues arise.  Dry skin (xerosis cutis) Dry skin, which may be exacerbated by chemotherapy. No current use of moisturizers. - Advise use of moisturizers regularly, especially during chemotherapy. - Consider Benadryl cream for itching if needed.  I reviewed lab results and outside records for this visit and discussed relevant results with the patient. Diagnosis, plan of care and treatment options were also discussed in detail with the patient. Opportunity provided to ask questions and answers provided to his apparent satisfaction. Provided instructions to call our clinic with any problems, questions or concerns prior to return visit. I recommended to continue follow-up with PCP and sub-specialists. He verbalized understanding and agreed with the plan. No barriers to learning was detected.  NCCN guidelines have been consulted in the planning of this patient's care.  Caleb Patten, MD  05/03/2024 6:38 PM   [facility] - A DEPT OF [name] [address] [place] [address] Dept: [phone] Dept Fax: [phone]   HISTORY OF PRESENTING ILLNESS:   I have reviewed his chart and materials related to his cancer extensively and collaborated history with the patient. Summary of oncologic history is as follows:  ONCOLOGY HISTORY:  Patient had a screening colonoscopy when he was 63 years old which showed some benign polyps.  He did not have a follow-up colonoscopy after that.  He had mild anemia in June 2025 with hemoglobin of 12.1, MCV 76.  His PCP obtained additional workup including Cologuard test.  Cologuard came back positive.  He had a colonoscopy on 01/22/2024, performed by Dr. Abran.  It showed 12 mm apparently did polyp in the rectum, 4 polyps in the descending colon  and transverse colon measuring 3 to 8 mm in size.  In the cecum there was a 4 to 5 cm soft polypoid mass which was friable.  This was biopsied with cold forceps.  In addition there were 4 PET included in semipedunculated polyps measuring between 10 and 25 mm.  This area was tattooed.  Multiple diverticula were noted in the colon.  Internal hemorrhoids.  He had an EGD that day which showed evidence of esophagitis and stricture.  Hiatal hernia was noted.  Question portal hypertensive gastropathy.  Pathology showed superficial fragments of tubulovillous adenoma from the cecal mass.  It was negative for high-grade dysplasia or carcinoma.  Rest of the polyps were either hyperplastic or tubular adenomas without evidence of malignancy or high-grade dysplasia.  On 01/29/2024, CT abdomen and pelvis was obtained for further evaluation which showed what appeared to be 4.3 cm intraluminal soft tissue density in the ascending colon, highly suspicious for polypoid intraluminal mass.  No evidence of metastatic disease.  Colonic diverticulosis was noted.  Probable hepatic cirrhosis was also noted.  With these findings, patient was referred to Dr. Bernarda Caleb, general surgery for further evaluation.  Though the biopsy did not show evidence of malignancy, given the size of the mass and clinical picture, Dr. Ned recommended proceeding with robotic assisted right colectomy.  After obtaining cardiac clearance, Dr. Ned performed robotic right colectomy on 04/12/2024.  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  Submucosal invasion was not identified.  Metastatic adenocarcinoma was noted in 2 out of 36 lymph nodes.  Margins were negative for carcinoma  or dysplasia.  No LVI, PNI. pTis,pN1b,cM0, Stage III A disease.  MMR proficient.  Staging CT chest on 05/02/2024 showed no evidence of intrathoracic metastatic disease.  Given lymph node positive adenocarcinoma of the cecum,  recommended adjuvant chemotherapy with FOLFOX regimen.  We will watch him closely for any development of cardiac arrhythmias.  Tentative treatment start around 05/15/2024.  Oncology History  Adenocarcinoma of cecum (HCC)  05/03/2024 Initial Diagnosis   Adenocarcinoma of cecum (HCC)   05/03/2024 Cancer Staging   Staging form: Colon and Rectum, AJCC 8th Edition - Clinical: Stage IIIA (cT1, cN1b, cM0) - Signed by Autumn Millman, MD on 05/03/2024 Histologic grade (G): G1 Histologic grading system: 4 grade system   05/15/2024 -  Chemotherapy   Patient is on Treatment Plan : COLORECTAL FOLFOX q14d x 3 months       INTERVAL HISTORY:  Discussed the use of AI scribe software for clinical note transcription with the patient, who gave verbal consent to proceed.  History of Present Illness Caleb [name] is a 63 year old male with stage 3 colon cancer who presents for oncology consultation regarding chemotherapy options. He is accompanied by Devere, his girlfriend.   He underwent a colonoscopy after a positive Cologuard test in July 2025, which revealed atypical cells. A subsequent surgery revealed two out of thirty-six lymph nodes tested positive for cancer. The tumor was located in the cecum, and the margins were negative post-surgery.  He has a history of atrial flutter for which he underwent two ablations, with the second being successful. He is currently taking flecainide  and was previously on blood thinners, which were discontinued by his cardiologist. He underwent an echocardiogram recently, which was normal.  In 2022, he was treated for osteomyelitis and discitis at the L3-4 level, which required a spinal fusion and IV antibiotics. He experienced significant weight loss of sixty pounds between May and October 2021, associated with back and groin pain, which led to the diagnosis.  His father had a history of lung cancer. He quit smoking twenty years ago and drinks beer regularly, consuming  three to five beers every other day. He enjoys riding motorcycles.   He denies current smoking and reports regular alcohol consumption.   MEDICAL HISTORY:  Past Medical History:  Diagnosis Date   Anemia    Arthritis    Diabetes mellitus without complication (HCC)    Type II   Dysrhythmia    aFlutter   had ablation 09-29-2021   GERD (gastroesophageal reflux disease)    Gout    HLD (hyperlipidemia)    Hypertension    Liver cirrhosis (HCC)    Sepsis (HCC) 04/04/2020    SURGICAL HISTORY: Past Surgical History:  Procedure Laterality Date   A-FLUTTER ABLATION N/A 08/02/2021   Procedure: A-FLUTTER ABLATION;  Surgeon: [name] ORN, MD;  Location: [facility];  Service: Cardiovascular;  Laterality: N/A;   A-FLUTTER ABLATION N/A 09/29/2021   Procedure: A-FLUTTER ABLATION;  Surgeon: [name] ORN, MD;  Location: [facility];  Service: Cardiovascular;  Laterality: N/A;   ANTERIOR CRUCIATE LIGAMENT REPAIR Right    x 2   HERNIA REPAIR  2010   umbilical hernia repaired with mesh   LUMBAR FUSION     L3-L4   ROTATOR CUFF REPAIR Right     SOCIAL HISTORY: He reports that he quit smoking about 15 years ago. His smoking use included cigarettes. He started smoking about 35 years ago. He has never used smokeless tobacco. He reports  that he does not currently use alcohol. He reports that he does not use drugs. Social History   Socioeconomic History   Marital status: Single    Spouse name: Not on file   Number of children: 2   Years of education: Not on file   Highest education level: Not on file  Occupational History   Occupation: retired  Tobacco Use   Smoking status: Former    Current packs/day: 0.00    Types: Cigarettes    Start date: 11/1988    Quit date: 11/2008    Years since quitting: 15.4   Smokeless tobacco: Never  Vaping Use   Vaping status: Never Used  Substance and Sexual Activity   Alcohol use: Not Currently   Drug use: Never   Sexual activity:  Yes  Other Topics Concern   Not on file  Social History Narrative   Not on file   Social Drivers of Health   Financial Resource Strain: Not on file  Food Insecurity: No Food Insecurity (05/03/2024)   Hunger Vital Sign    Worried About Running Out of Food in the Last Year: Never true    Ran Out of Food in the Last Year: Never true  Transportation Needs: No Transportation Needs (05/03/2024)   PRAPARE - Administrator, Civil Service (Medical): No    Lack of Transportation (Non-Medical): No  Physical Activity: Not on file  Stress: Not on file  Social Connections: Not on file  Intimate Partner Violence: Not At Risk (05/03/2024)   Humiliation, Afraid, Rape, and Kick questionnaire    Fear of Current or Ex-Partner: No    Emotionally Abused: No    Physically Abused: No    Sexually Abused: No    FAMILY HISTORY: Family History  Problem Relation Age of Onset   Atrial fibrillation Mother    Lung cancer Father    Healthy Sister    Dementia Maternal Grandmother    Other Maternal Grandfather        arteriosclerosis   Pneumonia Paternal Grandmother    Stroke Paternal Grandfather    Diabetes Neg Hx    Heart attack Neg Hx    Gout Neg Hx    Stomach cancer Neg Hx    Rectal cancer Neg Hx    Esophageal cancer Neg Hx    Colon cancer Neg Hx     ALLERGIES:  He has no known allergies.  MEDICATIONS:  Current Outpatient Medications  Medication Sig Dispense Refill   atorvastatin (LIPITOR) 20 MG tablet Take 20 mg by mouth daily.     Blood Glucose Monitoring Suppl (TRUE METRIX AIR GLUCOSE METER) DEVI Check CBG BID 1 each 11   colchicine 0.6 MG tablet Take 0.6 mg by mouth as needed (gout).     flecainide  (TAMBOCOR ) 100 MG tablet TAKE 1 TABLET BY MOUTH EVERY 12 HOURS 60 tablet 0   metFORMIN  (GLUCOPHAGE ) 500 MG tablet Take 500-1,000 mg by mouth See admin instructions. Take 1000 mg in the morning and 500 mg in the evening     metoprolol  succinate (TOPROL -XL) 50 MG 24 hr tablet Take 1  tablet (50 mg total) by mouth 2 (two) times daily. Patient must schedule annual office visit for further refills second attempt 30 tablet 0   tadalafil (CIALIS) 20 MG tablet Take 20 mg by mouth daily as needed for erectile dysfunction.     dexamethasone  (DECADRON ) 4 MG tablet Take 2 tablets (8 mg total) by mouth daily. Start the day after chemotherapy  for 2 days. Take with food. 30 tablet 1   lidocaine -prilocaine (EMLA) cream Apply to affected area once 30 g 3   metroNIDAZOLE  (FLAGYL ) 500 MG tablet Take 1,000 mg by mouth once. (Patient not taking: Reported on 05/03/2024)     neomycin (MYCIFRADIN) 500 MG tablet Take 1,000 mg by mouth 3 (three) times daily. (Patient not taking: Reported on 05/03/2024)     ondansetron  (ZOFRAN ) 8 MG tablet Take 1 tablet (8 mg total) by mouth every 8 (eight) hours as needed for nausea or vomiting. Start on the third day after chemotherapy. 30 tablet 1   polyethylene glycol powder (GLYCOLAX /MIRALAX ) 17 GM/SCOOP powder Take 119 g by mouth once. (Patient not taking: Reported on 05/03/2024)     prochlorperazine (COMPAZINE) 10 MG tablet Take 1 tablet (10 mg total) by mouth every 6 (six) hours as needed for nausea or vomiting. 30 tablet 1   traMADol  (ULTRAM ) 50 MG tablet Take 1 tablet (50 mg total) by mouth every 6 (six) hours as needed for moderate pain (pain score 4-6) or severe pain (pain score 7-10). 30 tablet 0   No current facility-administered medications for this visit.    REVIEW OF SYSTEMS:    Review of Systems - Oncology  All other pertinent systems were reviewed with the patient and are negative.  PHYSICAL EXAMINATION:   Onc Performance Status - 05/03/24 1200       ECOG Perf Status   ECOG Perf Status Fully active, able to carry on all pre-disease performance without restriction      KPS SCALE   KPS % SCORE Normal, no compliants, no evidence of disease          Vitals:   05/03/24 1200  BP: 120/72  Pulse: 79  Resp: 17  Temp: 98.3 F (36.8 C)   SpO2: 96%   Filed Weights   05/03/24 1200  Weight: 255 lb 6.4 oz (115.8 kg)    Physical Exam Constitutional:      General: He is not in acute distress.    Appearance: Normal appearance.  HENT:     Head: Normocephalic and atraumatic.  Eyes:     Conjunctiva/sclera: Conjunctivae normal.  Cardiovascular:     Rate and Rhythm: Normal rate and regular rhythm.     Heart sounds: Normal heart sounds.  Pulmonary:     Effort: Pulmonary effort is normal. No respiratory distress.     Breath sounds: Normal breath sounds.  Abdominal:     General: There is no distension.     Comments: Scars from recent surgery are healing well without any signs of infection  Neurological:     General: No focal deficit present.     Mental Status: He is alert and oriented to person, place, and time.  Psychiatric:        Mood and Affect: Mood normal.        Behavior: Behavior normal.      LABORATORY DATA:   I have reviewed the data as listed.  Results for orders placed or performed in visit on 05/03/24  CMP (Cancer Center only)  Result Value Ref Range   Sodium 142 135 - 145 mmol/L   Potassium 4.7 3.5 - 5.1 mmol/L   Chloride 107 98 - 111 mmol/L   CO2 29 22 - 32 mmol/L   Glucose, Bld 88 70 - 99 mg/dL   BUN 9 8 - 23 mg/dL   Creatinine 9.11 9.38 - 1.24 mg/dL   Calcium 9.3 8.9 - 89.6 mg/dL  Total Protein 7.6 6.5 - 8.1 g/dL   Albumin  4.1 3.5 - 5.0 g/dL   AST 38 15 - 41 U/L   ALT 39 0 - 44 U/L   Alkaline Phosphatase 100 38 - 126 U/L   Total Bilirubin 0.4 0.0 - 1.2 mg/dL   GFR, Estimated >39 >39 mL/min   Anion gap 6 5 - 15  CBC with Differential (Cancer Center Only)  Result Value Ref Range   WBC Count 5.4 4.0 - 10.5 K/uL   RBC 4.96 4.22 - 5.81 MIL/uL   Hemoglobin 14.4 13.0 - 17.0 g/dL   HCT 56.9 60.9 - 47.9 %   MCV 86.7 80.0 - 100.0 fL   MCH 29.0 26.0 - 34.0 pg   MCHC 33.5 30.0 - 36.0 g/dL   RDW 86.1 88.4 - 84.4 %   Platelet Count 265 150 - 400 K/uL   nRBC 0.0 0.0 - 0.2 %   Neutrophils  Relative % 68 %   Neutro Abs 3.7 1.7 - 7.7 K/uL   Lymphocytes Relative 20 %   Lymphs Abs 1.1 0.7 - 4.0 K/uL   Monocytes Relative 10 %   Monocytes Absolute 0.5 0.1 - 1.0 K/uL   Eosinophils Relative 1 %   Eosinophils Absolute 0.1 0.0 - 0.5 K/uL   Basophils Relative 1 %   Basophils Absolute 0.1 0.0 - 0.1 K/uL   Immature Granulocytes 0 %   Abs Immature Granulocytes 0.01 0.00 - 0.07 K/uL     RADIOGRAPHIC STUDIES:  I have personally reviewed the radiological images as listed and agree with the findings in the report.  CT CHEST W CONTRAST Result Date: 05/02/2024 EXAM: CT CHEST WITH CONTRAST 05/02/2024 10:22:26 AM TECHNIQUE: CT of the chest was performed with the administration of 80 mL iopamidol  (ISOVUE -300) 61% injection. Multiplanar reformatted images are provided for review. Automated exposure control, iterative reconstruction, and/or weight based adjustment of the mA/kV was utilized to reduce the radiation dose to as low as reasonably achievable. COMPARISON: CT abdomen and pelvis 01/29/2024. CLINICAL HISTORY: HX of Colon cancer; Cancer cells found in lymph nodes. FINDINGS: MEDIASTINUM: Aortic valve leaflet calcification. LAD coronary artery calcifications. Pericardium is unremarkable. The central airways are clear. Mild atherosclerotic plaque of thoracic aorta. Tiny hiatal hernia. LYMPH NODES: No mediastinal, hilar or axillary lymphadenopathy. LUNGS AND PLEURA: No suspicious pulmonary nodules. Mild bilateral bronchial wall thickening. No focal consolidation or pulmonary edema. No pleural effusion or pneumothorax. SOFT TISSUES/BONES: Multilevel degenerative changes of spine. No acute abnormality of the soft tissues. UPPER ABDOMEN: Limited images of the upper abdomen demonstrate an 8.6 x 8.1 cm rim calcified and hypodense lesion within the spleen, possibly representing a calcified cyst or complex cystic lesion.No change from priors. No other acute abnormality is seen in the limited upper abdomen.  IMPRESSION: 1. No evidence of thoracic metastasis. Electronically signed by: Norleen Boxer MD 05/02/2024 12:20 PM EDT RP Workstation: [id number]    Orders Placed This Encounter  Procedures   Consent Attestation for Oncology Treatment    The patient is informed of risks, benefits, side-effects of the prescribed oncology treatment. Potential short term and long term side effects and response rates discussed. After a long discussion, the patient made informed decision to proceed.:   Yes   CBC with Differential (Cancer Center Only)    Standing Status:   Future    Number of Occurrences:   1    Expiration Date:   05/03/2025   CMP (Cancer Center only)    Standing Status:  Future    Number of Occurrences:   1    Expiration Date:   05/03/2025   CEA (Access)    Standing Status:   Future    Number of Occurrences:   1    Expiration Date:   05/03/2025   Guardant Reveal    Standing Status:   Future    Number of Occurrences:   1    Expiration Date:   05/03/2025   Guardant 360    Standing Status:   Future    Number of Occurrences:   1    Expiration Date:   05/03/2025   CBC with Differential (Cancer Center Only)    Standing Status:   Future    Expected Date:   05/15/2024    Expiration Date:   05/15/2025   CMP (Cancer Center only)    Standing Status:   Future    Expected Date:   05/15/2024    Expiration Date:   05/15/2025   CBC with Differential (Cancer Center Only)    Standing Status:   Future    Expected Date:   05/29/2024    Expiration Date:   05/29/2025   CMP (Cancer Center only)    Standing Status:   Future    Expected Date:   05/29/2024    Expiration Date:   05/29/2025   CBC with Differential (Cancer Center Only)    Standing Status:   Future    Expected Date:   06/12/2024    Expiration Date:   06/12/2025   CMP (Cancer Center only)    Standing Status:   Future    Expected Date:   06/12/2024    Expiration Date:   06/12/2025   [facility] PHYSICIAN COMMUNICATION 1    0 Number of doses of  oxaliplatin received at [facility] or outside facility. AP signature of Provider. If patient has received greater than 5 doses of oxaliplatin, the following pre-medications should be ordered: dexamethasone , diphenhydramine, and formulary histamine H2 antagonist. If patient cannot tolerate oral histamine H2 antagonist, IV may be given.    CODE STATUS:  Code Status History     Date Active Date Inactive Code Status Order ID Comments User Context   04/12/2024 1523 04/13/2024 1633 Full Code [id number]  Debby Hila, MD Inpatient   09/29/2021 1048 09/29/2021 2301 Full Code [id number]  [name] ORN, MD Inpatient   08/02/2021 1122 08/02/2021 2215 Full Code [id number]  [name] ORN, MD Inpatient   09/30/2020 1930 10/01/2020 1719 Full Code [id number]  Cheryle Debby LABOR, MD Inpatient   04/03/2020 2150 04/08/2020 2149 Full Code [id number]  Shona Terry SAILOR, DO Inpatient    Questions for Most Recent Historical Code Status (Order [id number])     Question Answer   By: Other            I spent a total of 70 minutes during this encounter with the patient including review of chart and various tests results, discussions about plan of care and coordination of care plan.  This document was completed utilizing speech recognition software. Grammatical errors, random word insertions, pronoun errors, and incomplete sentences are an occasional consequence of this system due to software limitations, ambient noise, and hardware issues. Any formal questions or concerns about the content, text or information contained within the body of this dictation should be directly addressed to the provider for clarification.

## 2024-05-05 ENCOUNTER — Other Ambulatory Visit: Payer: Self-pay

## 2024-05-06 ENCOUNTER — Inpatient Hospital Stay

## 2024-05-06 ENCOUNTER — Other Ambulatory Visit: Payer: Self-pay

## 2024-05-06 DIAGNOSIS — C180 Malignant neoplasm of cecum: Secondary | ICD-10-CM

## 2024-05-06 NOTE — Progress Notes (Unsigned)
 PATIENT NAVIGATOR PROGRESS NOTE  Name: [name] Date: 05/06/2024 MRN: [id number]  DOB: Dec 18, 1960   Reason for visit:  Follow-up phone call  Comments:  Called patient for follow-up after his initial visit with Dr. Autumn on 10/24.  Voice mail left instructing patient to contact office.   Orders placed for port a cath placement per Dr. Clovis request. Non-urgent social work referral was also placed.   Will follow-up with scheduling to make sure Chemo education class has been scheduled.   Time spent counseling/coordinating care: 45-60 minutes

## 2024-05-06 NOTE — Progress Notes (Signed)
 [facility] Clinical Social Work  Initial Assessment   Caleb [name] is a 63 y.o. year old male contacted by phone. Clinical Social Work was referred by nurse navigator for assessment of psychosocial needs.   SDOH (Social Determinants of Health) assessments performed: No   SDOH Screenings   Food Insecurity: No Food Insecurity (05/03/2024)  Housing: Unknown (05/03/2024)  Transportation Needs: No Transportation Needs (05/03/2024)  Utilities: Not At Risk (05/03/2024)  Depression (PHQ2-9): Low Risk  (05/03/2024)  Tobacco Use: Medium Risk (05/03/2024)    PHQ 2/9:    05/03/2024   12:00 PM 06/11/2020    8:39 AM 05/12/2020   10:50 AM  Depression screen PHQ 2/9  Decreased Interest 0 0 0  Down, Depressed, Hopeless 0 0 0  PHQ - 2 Score 0 0 0     Distress Screen completed: No     No data to display            Family/Social Information:  Housing Arrangement: patient lives alone Family members/support persons in your life? Family Transportation concerns: no, his significant other will be transporting him to his first appointment. He hopes to transport himself to the other appointments in the future.    Employment: Retired in Feb 2025.  Income source: Actor concerns: No Type of concern: None Food access concerns: no Religious or spiritual practice: Yes-Christian. Patient described how his faith has helped him manage stress related to diagnosis. Advanced directives: No, but is interested in completing this documents.   Coping/ Adjustment to diagnosis: Patient understands treatment plan and what happens next? yes Concerns about diagnosis and/or treatment: Patient stated he expected himself to have more emotions towards diagnosis. However, he denied any concerns or challenges with mood related to diagnosis. No concerns with significant other or children.  Patient reported stressors: No Reported Stressors  Patient enjoys Patient enjoys being outside,  described hunting and fishing as his favorite activities.  Current coping skills/ strengths: Ability for insight , Active sense of humor , Average or above average intelligence , Capable of independent living , Communication skills , Financial means , General fund of knowledge , Motivation for treatment/growth , Physical Health , Religious Affiliation , Special hobby/interest , and Supportive family/friends     SUMMARY: Current SDOH Barriers:  No SDOH Barriers identified at this time.   Clinical Social Work Clinical Goal(s):  No clinical social work goals at this time  Interventions: Discussed common feeling and emotions when being diagnosed with cancer, and the importance of support during treatment Informed patient of the support team roles and support services at [facility] Provided CSW contact information and encouraged patient to call with any questions or concerns Provided education and assistance to client regarding Advanced Directives.    Follow Up Plan: CSW will follow-up with patient by phone  on 11/21 to discuss Advance Directives  Patient verbalizes understanding of plan: Yes    Lizbeth Sprague, LCSW Clinical Social Worker [facility]

## 2024-05-07 ENCOUNTER — Encounter: Payer: Self-pay | Admitting: Oncology

## 2024-05-07 ENCOUNTER — Other Ambulatory Visit: Payer: Self-pay | Admitting: Internal Medicine

## 2024-05-07 ENCOUNTER — Other Ambulatory Visit: Payer: Self-pay

## 2024-05-07 ENCOUNTER — Other Ambulatory Visit: Payer: Self-pay | Admitting: Oncology

## 2024-05-07 NOTE — Progress Notes (Signed)
 PATIENT NAVIGATOR PROGRESS NOTE  Name: Caleb [name] Date: 05/07/2024 MRN: [id number]  DOB: May 31, 1961   Reason for visit:  Follow-up Phone Call  Comments:   Called and spoke to patient for follow-up after his initial appt with Dr. Autumn on 10/24.  Informed patient he will be getting a phone call from our scheduling department to schedule him for chemo education and also his initial chemo treatment.  Patient verbalized understanding.  Message sent to schedulers.    Time spent counseling/coordinating care: 30-45 minutes

## 2024-05-08 ENCOUNTER — Other Ambulatory Visit: Payer: Self-pay

## 2024-05-08 NOTE — Telephone Encounter (Signed)
 Pt of Dr. Waddell. This Pt was last seen by a Gen Card App but, this RX was Prescribed by and refilled by Dr. Waddell. Pt is overdue to see Dr. Waddell. 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 Waddell want to refill? Please advise.

## 2024-05-08 NOTE — Telephone Encounter (Signed)
 Please refilled  medication , have patient to schedule  Tillery PA , he was last PA who saw patient  or schedule with Dr Waddell  Do not see if patient has a general cardiologist

## 2024-05-08 NOTE — Progress Notes (Signed)
 The proposed treatment discussed in conference is for discussion purpose only and is not a binding recommendation.  The patients have not been physically examined, or presented with their treatment options.  Therefore, final treatment plans cannot be decided.

## 2024-05-10 ENCOUNTER — Other Ambulatory Visit: Payer: Self-pay

## 2024-05-13 ENCOUNTER — Inpatient Hospital Stay: Attending: Oncology

## 2024-05-13 DIAGNOSIS — C779 Secondary and unspecified malignant neoplasm of lymph node, unspecified: Secondary | ICD-10-CM | POA: Insufficient documentation

## 2024-05-13 DIAGNOSIS — C180 Malignant neoplasm of cecum: Secondary | ICD-10-CM | POA: Insufficient documentation

## 2024-05-13 DIAGNOSIS — Z5111 Encounter for antineoplastic chemotherapy: Secondary | ICD-10-CM | POA: Insufficient documentation

## 2024-05-13 DIAGNOSIS — Z79899 Other long term (current) drug therapy: Secondary | ICD-10-CM | POA: Insufficient documentation

## 2024-05-14 ENCOUNTER — Encounter: Payer: Self-pay | Admitting: Oncology

## 2024-05-14 ENCOUNTER — Other Ambulatory Visit (HOSPITAL_COMMUNITY)

## 2024-05-14 ENCOUNTER — Other Ambulatory Visit: Payer: Self-pay

## 2024-05-15 ENCOUNTER — Other Ambulatory Visit: Payer: Self-pay | Admitting: Radiology

## 2024-05-15 NOTE — H&P (Signed)
 Chief Complaint: Newly diagnosed adenocarcinoma of the cecum; referred for Port-A-Cath placement to assist with treatment  Referring Provider(s): Pasam,A  Supervising Physician: Jenna Hacker  Patient Status: [facility] - Out-pt  History of Present Illness: Caleb [name] is a 63 y.o. male ex smoker with past medical history significant for anemia, arthritis, diabetes, a flutter with prior ablation, GERD, gout, hyperlipidemia, umbilical hernia repair with mesh in 2010, hypertension, cirrhosis who presents now with newly diagnosed adenocarcinoma of the cecum.  He is scheduled today for Port-A-Cath placement to assist with treatment.  *** Patient is Full Code  Past Medical History:  Diagnosis Date   Anemia    Arthritis    Diabetes mellitus without complication (HCC)    Type II   Dysrhythmia    aFlutter   had ablation 09-29-2021   GERD (gastroesophageal reflux disease)    Gout    HLD (hyperlipidemia)    Hypertension    Liver cirrhosis (HCC)    Sepsis (HCC) 04/04/2020    Past Surgical History:  Procedure Laterality Date   A-FLUTTER ABLATION N/A 08/02/2021   Procedure: A-FLUTTER ABLATION;  Surgeon: Waddell Danelle ORN, MD;  Location: [facility] INVASIVE CV LAB;  Service: Cardiovascular;  Laterality: N/A;   A-FLUTTER ABLATION N/A 09/29/2021   Procedure: A-FLUTTER ABLATION;  Surgeon: Waddell Danelle ORN, MD;  Location: [facility] INVASIVE CV LAB;  Service: Cardiovascular;  Laterality: N/A;   ANTERIOR CRUCIATE LIGAMENT REPAIR Right    x 2   HERNIA REPAIR  2010   umbilical hernia repaired with mesh   LUMBAR FUSION     L3-L4   ROTATOR CUFF REPAIR Right     Allergies: Patient has no known allergies.  Medications: Prior to Admission medications   Medication Sig Start Date End Date Taking? Authorizing Provider  atorvastatin (LIPITOR) 20 MG tablet Take 20 mg by mouth daily. 11/27/22   [provider]  Blood Glucose Monitoring Suppl (TRUE METRIX AIR GLUCOSE METER) DEVI Check CBG BID 03/11/20    Hilts, Michael, MD  colchicine 0.6 MG tablet Take 0.6 mg by mouth as needed (gout). 09/03/22   [provider]  dexamethasone  (DECADRON ) 4 MG tablet Take 2 tablets (8 mg total) by mouth daily. Start the day after chemotherapy for 2 days. Take with food. 05/03/24   Pasam, Chinita, MD  flecainide  (TAMBOCOR ) 100 MG tablet TAKE 1 TABLET BY MOUTH EVERY 12 HOURS 04/15/24   Waddell Danelle ORN, MD  lidocaine -prilocaine (EMLA) cream Apply to affected area once 05/03/24   Pasam, Avinash, MD  metFORMIN  (GLUCOPHAGE ) 500 MG tablet Take 500-1,000 mg by mouth See admin instructions. Take 1000 mg in the morning and 500 mg in the evening    [provider]  metoprolol  succinate (TOPROL -XL) 50 MG 24 hr tablet Take 1 tablet (50 mg total) by mouth 2 (two) times daily. Patient must schedule annual office visit for further refills second attempt 01/30/24   Waddell Danelle ORN, MD  metroNIDAZOLE  (FLAGYL ) 500 MG tablet Take 1,000 mg by mouth once. Patient not taking: Reported on 05/03/2024 02/05/24   [provider]  neomycin (MYCIFRADIN) 500 MG tablet Take 1,000 mg by mouth 3 (three) times daily. Patient not taking: Reported on 05/03/2024 02/05/24   [provider]  ondansetron  (ZOFRAN ) 8 MG tablet Take 1 tablet (8 mg total) by mouth every 8 (eight) hours as needed for nausea or vomiting. Start on the third day after chemotherapy. 05/03/24   Pasam, Avinash, MD  polyethylene glycol powder (GLYCOLAX /MIRALAX ) 17 GM/SCOOP powder Take  119 g by mouth once. Patient not taking: Reported on 05/03/2024 02/08/24   [provider]  prochlorperazine (COMPAZINE) 10 MG tablet Take 1 tablet (10 mg total) by mouth every 6 (six) hours as needed for nausea or vomiting. 05/03/24   Pasam, Chinita, MD  tadalafil (CIALIS) 20 MG tablet Take 20 mg by mouth daily as needed for erectile dysfunction. 01/19/24   [provider]  traMADol  (ULTRAM ) 50 MG tablet Take 1 tablet (50 mg total) by mouth every 6 (six)  hours as needed for moderate pain (pain score 4-6) or severe pain (pain score 7-10). 05/03/24   Pasam, Chinita, MD     Family History  Problem Relation Age of Onset   Atrial fibrillation Mother    Lung cancer Father    Healthy Sister    Dementia Maternal Grandmother    Other Maternal Grandfather        arteriosclerosis   Pneumonia Paternal Grandmother    Stroke Paternal Grandfather    Diabetes Neg Hx    Heart attack Neg Hx    Gout Neg Hx    Stomach cancer Neg Hx    Rectal cancer Neg Hx    Esophageal cancer Neg Hx    Colon cancer Neg Hx     Social History   Socioeconomic History   Marital status: Single    Spouse name: Not on file   Number of children: 2   Years of education: Not on file   Highest education level: Not on file  Occupational History   Occupation: retired  Tobacco Use   Smoking status: Former    Current packs/day: 0.00    Types: Cigarettes    Start date: 11/1988    Quit date: 11/2008    Years since quitting: 15.5   Smokeless tobacco: Never  Vaping Use   Vaping status: Never Used  Substance and Sexual Activity   Alcohol use: Not Currently   Drug use: Never   Sexual activity: Yes  Other Topics Concern   Not on file  Social History Narrative   Not on file   Social Drivers of Health   Financial Resource Strain: Not on file  Food Insecurity: No Food Insecurity (05/03/2024)   Hunger Vital Sign    Worried About Running Out of Food in the Last Year: Never true    Ran Out of Food in the Last Year: Never true  Transportation Needs: No Transportation Needs (05/03/2024)   PRAPARE - Administrator, Civil Service (Medical): No    Lack of Transportation (Non-Medical): No  Physical Activity: Not on file  Stress: Not on file  Social Connections: Not on file      Review of Systems  Vital Signs:   Advance Care Plan: no documents on file    Physical Exam  Imaging: CT CHEST W CONTRAST Result Date: 05/02/2024 EXAM: CT CHEST WITH  CONTRAST 05/02/2024 10:22:26 AM TECHNIQUE: CT of the chest was performed with the administration of 80 mL iopamidol  (ISOVUE -300) 61% injection. Multiplanar reformatted images are provided for review. Automated exposure control, iterative reconstruction, and/or weight based adjustment of the mA/kV was utilized to reduce the radiation dose to as low as reasonably achievable. COMPARISON: CT abdomen and pelvis 01/29/2024. CLINICAL HISTORY: HX of Colon cancer; Cancer cells found in lymph nodes. FINDINGS: MEDIASTINUM: Aortic valve leaflet calcification. LAD coronary artery calcifications. Pericardium is unremarkable. The central airways are clear. Mild atherosclerotic plaque of thoracic aorta. Tiny hiatal hernia. LYMPH NODES: No mediastinal, hilar or  axillary lymphadenopathy. LUNGS AND PLEURA: No suspicious pulmonary nodules. Mild bilateral bronchial wall thickening. No focal consolidation or pulmonary edema. No pleural effusion or pneumothorax. SOFT TISSUES/BONES: Multilevel degenerative changes of spine. No acute abnormality of the soft tissues. UPPER ABDOMEN: Limited images of the upper abdomen demonstrate an 8.6 x 8.1 cm rim calcified and hypodense lesion within the spleen, possibly representing a calcified cyst or complex cystic lesion.No change from priors. No other acute abnormality is seen in the limited upper abdomen. IMPRESSION: 1. No evidence of thoracic metastasis. Electronically signed by: Norleen Boxer MD 05/02/2024 12:20 PM EDT RP Workstation: [id number]    Labs:  CBC: Recent Labs    02/19/24 0937 04/02/24 0812 04/13/24 0512 05/03/24 1407  WBC 4.4 5.0 5.6 5.4  HGB 14.7 15.6 12.9* 14.4  HCT 47.5 49.1 39.8 43.0  PLT 169 178 127* 265    COAGS: Recent Labs    02/02/24 0930  INR 1.1*    BMP: Recent Labs    02/19/24 0937 04/02/24 0812 04/13/24 0512 05/03/24 1407  NA 139 140 135 142  K 4.8 4.7 4.8 4.7  CL 103 104 103 107  CO2 25 23 21* 29  GLUCOSE 131* 116* 179* 88  BUN 7* 8 6*  9  CALCIUM 8.9 8.9 8.9 9.3  CREATININE 0.87 0.91 0.82 0.88  GFRNONAA >60 >60 >60 >60    LIVER FUNCTION TESTS: Recent Labs    02/02/24 0930 02/19/24 0937 05/03/24 1407  BILITOT 0.5 0.9 0.4  AST 24 46* 38  ALT 21 34 39  ALKPHOS 81 73 100  PROT 7.4 7.7 7.6  ALBUMIN  4.1 3.9 4.1    TUMOR MARKERS: Recent Labs    02/02/24 0930 05/03/24 1407  AFPTM 2.6  --   CEA 2.2 5.32*    Assessment and Plan: 63 y.o. male ex smoker with past medical history significant for anemia, arthritis, diabetes, a flutter with prior ablation, GERD, gout, hyperlipidemia, umbilical hernia repair with mesh in 2010, hypertension, cirrhosis who presents now with newly diagnosed adenocarcinoma of the cecum.  He is scheduled today for Port-A-Cath placement to assist with treatment.Risks and benefits of image guided port-a-catheter placement was discussed with the patient including, but not limited to bleeding, infection, pneumothorax, or fibrin sheath development and need for additional procedures.  All of the patient's questions were answered, patient is agreeable to proceed. Consent signed and in chart.    Thank you for allowing our service to participate in Caleb [name] 's care.  Electronically Signed: D. Franky Rakers, PA-C   05/15/2024, 4:57 PM      I spent a total of  20 minutes   in face to face in clinical consultation, greater than 50% of which was counseling/coordinating care for port a cath placement

## 2024-05-16 ENCOUNTER — Other Ambulatory Visit: Payer: Self-pay

## 2024-05-16 ENCOUNTER — Ambulatory Visit (HOSPITAL_COMMUNITY)
Admission: RE | Admit: 2024-05-16 | Discharge: 2024-05-16 | Disposition: A | Source: Ambulatory Visit | Attending: Oncology

## 2024-05-16 ENCOUNTER — Ambulatory Visit (HOSPITAL_COMMUNITY)
Admission: RE | Admit: 2024-05-16 | Discharge: 2024-05-16 | Disposition: A | Discharge status: Home / Self Care | Source: Ambulatory Visit | Attending: Oncology | Admitting: Oncology

## 2024-05-16 ENCOUNTER — Encounter (HOSPITAL_COMMUNITY): Payer: Self-pay

## 2024-05-16 ENCOUNTER — Telehealth: Payer: Self-pay | Admitting: Internal Medicine

## 2024-05-16 DIAGNOSIS — C180 Malignant neoplasm of cecum: Secondary | ICD-10-CM | POA: Insufficient documentation

## 2024-05-16 DIAGNOSIS — I10 Essential (primary) hypertension: Secondary | ICD-10-CM | POA: Insufficient documentation

## 2024-05-16 DIAGNOSIS — Z79899 Other long term (current) drug therapy: Secondary | ICD-10-CM | POA: Diagnosis not present

## 2024-05-16 DIAGNOSIS — Z87891 Personal history of nicotine dependence: Secondary | ICD-10-CM | POA: Diagnosis not present

## 2024-05-16 DIAGNOSIS — E785 Hyperlipidemia, unspecified: Secondary | ICD-10-CM | POA: Insufficient documentation

## 2024-05-16 DIAGNOSIS — K7460 Unspecified cirrhosis of liver: Secondary | ICD-10-CM | POA: Diagnosis not present

## 2024-05-16 DIAGNOSIS — K219 Gastro-esophageal reflux disease without esophagitis: Secondary | ICD-10-CM | POA: Insufficient documentation

## 2024-05-16 DIAGNOSIS — M1000 Idiopathic gout, unspecified site: Secondary | ICD-10-CM | POA: Diagnosis not present

## 2024-05-16 DIAGNOSIS — Z7984 Long term (current) use of oral hypoglycemic drugs: Secondary | ICD-10-CM | POA: Insufficient documentation

## 2024-05-16 DIAGNOSIS — E119 Type 2 diabetes mellitus without complications: Secondary | ICD-10-CM | POA: Insufficient documentation

## 2024-05-16 HISTORY — PX: IR IMAGING GUIDED PORT INSERTION: IMG5740

## 2024-05-16 LAB — GLUCOSE, CAPILLARY: Glucose-Capillary: 111 mg/dL — ABNORMAL HIGH (ref 70–99)

## 2024-05-16 LAB — GUARDANT REVEAL

## 2024-05-16 MED ORDER — HEPARIN SOD (PORK) LOCK FLUSH 100 UNIT/ML IV SOLN
500.0000 [IU] | Freq: Once | INTRAVENOUS | Status: AC
  Administered 2024-05-16: 500 [IU] via INTRAVENOUS

## 2024-05-16 MED ORDER — DIPHENHYDRAMINE HCL 50 MG/ML IJ SOLN
INTRAMUSCULAR | Status: AC | PRN
  Administered 2024-05-16: 25 mg via INTRAVENOUS

## 2024-05-16 MED ORDER — MIDAZOLAM HCL (PF) 2 MG/2ML IJ SOLN
INTRAMUSCULAR | Status: AC | PRN
  Administered 2024-05-16 (×3): 1 mg via INTRAVENOUS

## 2024-05-16 MED ORDER — FENTANYL CITRATE (PF) 100 MCG/2ML IJ SOLN
INTRAMUSCULAR | Status: AC
  Filled 2024-05-16: qty 2

## 2024-05-16 MED ORDER — SODIUM CHLORIDE 0.9 % IV SOLN: INTRAVENOUS | Status: DC

## 2024-05-16 MED ORDER — LIDOCAINE-EPINEPHRINE 1 %-1:100000 IJ SOLN
INTRAMUSCULAR | Status: AC
  Filled 2024-05-16: qty 1

## 2024-05-16 MED ORDER — HEPARIN SOD (PORK) LOCK FLUSH 100 UNIT/ML IV SOLN
INTRAVENOUS | Status: AC
  Filled 2024-05-16: qty 5

## 2024-05-16 MED ORDER — DIPHENHYDRAMINE HCL 50 MG/ML IJ SOLN
INTRAMUSCULAR | Status: AC
  Filled 2024-05-16: qty 1

## 2024-05-16 MED ORDER — FENTANYL CITRATE (PF) 100 MCG/2ML IJ SOLN
INTRAMUSCULAR | Status: AC | PRN
  Administered 2024-05-16 (×2): 50 ug via INTRAVENOUS

## 2024-05-16 MED ORDER — LIDOCAINE HCL 1 % IJ SOLN
20.0000 mL | Freq: Once | INTRAMUSCULAR | Status: AC
  Administered 2024-05-16: 15 mL via INTRADERMAL

## 2024-05-16 MED ORDER — MIDAZOLAM HCL 2 MG/2ML IJ SOLN
INTRAMUSCULAR | Status: AC
  Filled 2024-05-16: qty 4

## 2024-05-16 MED ORDER — METOPROLOL SUCCINATE ER 50 MG PO TB24: 50.0000 mg | ORAL_TABLET | Freq: Two times a day (BID) | ORAL | 1 refills | Status: DC

## 2024-05-16 NOTE — Discharge Instructions (Signed)
Discharge Instructions:   Please call Interventional Radiology clinic [phone] with any questions or concerns.  You may remove your dressing and shower tomorrow.  Do not use EMLA / Lidocaine cream for 2 weeks post Port Insertion this will remove the surgical glue.  Moderate Conscious Sedation, Adult, Care After This sheet gives you information about how to care for yourself after your procedure. Your health care provider may also give you more specific instructions. If you have problems or questions, contact your health care provider. What can I expect after the procedure? After the procedure, it is common to have: Sleepiness for several hours. Impaired judgment for several hours. Difficulty with balance. Vomiting if you eat too soon. Follow these instructions at home: For the time period you were told by your health care provider: Rest. Do not participate in activities where you could fall or become injured. Do not drive or use machinery. Do not drink alcohol. Do not take sleeping pills or medicines that cause drowsiness. Do not make important decisions or sign legal documents. Do not take care of children on your own. Eating and drinking  Follow the diet recommended by your health care provider. Drink enough fluid to keep your urine pale yellow. If you vomit: Drink water, juice, or soup when you can drink without vomiting. Make sure you have little or no nausea before eating solid foods. General instructions Take over-the-counter and prescription medicines only as told by your health care provider. Have a responsible adult stay with you for the time you are told. It is important to have someone help care for you until you are awake and alert. Do not smoke. Keep all follow-up visits as told by your health care provider. This is important. Contact a health care provider if: You are still sleepy or having trouble with balance after 24 hours. You feel light-headed. You keep  feeling nauseous or you keep vomiting. You develop a rash. You have a fever. You have redness or swelling around the IV site. Get help right away if: You have trouble breathing. You have new-onset confusion at home. Summary After the procedure, it is common to feel sleepy, have impaired judgment, or feel nauseous if you eat too soon. Rest after you get home. Know the things you should not do after the procedure. Follow the diet recommended by your health care provider and drink enough fluid to keep your urine pale yellow. Get help right away if you have trouble breathing or new-onset confusion at home. This information is not intended to replace advice given to you by your health care provider. Make sure you discuss any questions you have with your health care provider. Document Revised: 10/25/2019 Document Reviewed: 05/23/2019 Elsevier Patient Education  2023 Elsevier Inc.  Implanted Port Insertion, Care After The following information offers guidance on how to care for yourself after your procedure. Your health care provider may also give you more specific instructions. If you have problems or questions, contact your health care provider. What can I expect after the procedure? After the procedure, it is common to have: Discomfort at the port insertion site. Bruising on the skin over the port. This should improve over 3-4 days. Follow these instructions at home: Port care After your port is placed, you will get a manufacturer's information card. The card has information about your port. Keep this card with you at all times. Take care of the port as told by your health care provider. Ask your health care provider if you or   a family member can get training for taking care of the port at home. A home health care nurse will be be available to help care for the port. Make sure to remember what type of port you have. Incision care     Follow instructions from your health care provider  about how to take care of your port insertion site. Make sure you: Wash your hands with soap and water for at least 20 seconds before and after you change your bandage (dressing). If soap and water are not available, use hand sanitizer. Change your dressing as told by your health care provider. Leave stitches (sutures), skin glue, or adhesive strips in place. These skin closures may need to stay in place for 2 weeks or longer. If adhesive strip edges start to loosen and curl up, you may trim the loose edges. Do not remove adhesive strips completely unless your health care provider tells you to do that. Check your port insertion site every day for signs of infection. Check for: Redness, swelling, or pain. Fluid or blood. Warmth. Pus or a bad smell. Activity Return to your normal activities as told by your health care provider. Ask your health care provider what activities are safe for you. You may have to avoid lifting. Ask your health care provider how much you can safely lift. General instructions Take over-the-counter and prescription medicines only as told by your health care provider. Do not take baths, swim, or use a hot tub until your health care provider approves. Ask your health care provider if you may take showers. You may only be allowed to take sponge baths. If you were given a sedative during the procedure, it can affect you for several hours. Do not drive or operate machinery until your health care provider says that it is safe. Wear a medical alert bracelet in case of an emergency. This will tell any health care providers that you have a port. Keep all follow-up visits. This is important. Contact a health care provider if: You cannot flush your port with saline as directed, or you cannot draw blood from the port. You have a fever or chills. You have redness, swelling, or pain around your port insertion site. You have fluid or blood coming from your port insertion site. Your port  insertion site feels warm to the touch. You have pus or a bad smell coming from the port insertion site. Get help right away if: You have chest pain or shortness of breath. You have bleeding from your port that you cannot control. These symptoms may be an emergency. Get help right away. Call 911. Do not wait to see if the symptoms will go away. Do not drive yourself to the hospital. Summary Take care of the port as told by your health care provider. Keep the manufacturer's information card with you at all times. Change your dressing as told by your health care provider. Contact a health care provider if you have a fever or chills or if you have redness, swelling, or pain around your port insertion site. Keep all follow-up visits. This information is not intended to replace advice given to you by your health care provider. Make sure you discuss any questions you have with your health care provider. Document Revised: 12/29/2020 Document Reviewed: 12/29/2020 Elsevier Patient Education  2023 Elsevier Inc.  

## 2024-05-16 NOTE — Procedures (Addendum)
 Interventional Radiology Procedure Note  Procedure: chest port placement  Complications: None  Estimated Blood Loss: < 10 mL  Findings: RIJ access for sl chest port placement.  Cordella DELENA Banner, MD

## 2024-05-16 NOTE — Telephone Encounter (Signed)
*  STAT* If patient is at the pharmacy, call can be transferred to refill team.   1. Which medications need to be refilled? (please list name of each medication and dose if known)   metoprolol  succinate (TOPROL -XL) 50 MG 24 hr tablet    2. Which pharmacy/location (including street and city if local pharmacy) is medication to be sent to?  [id number] - [place] ([place]), [place] - [address]      3. Do they need a 30 day or 90 day supply? 90 day    Pt is out of medication

## 2024-05-16 NOTE — Telephone Encounter (Signed)
 Pt's medication was sent to pt's pharmacy as requested. Confirmation received.

## 2024-05-17 ENCOUNTER — Encounter: Payer: Self-pay | Admitting: Oncology

## 2024-05-17 ENCOUNTER — Other Ambulatory Visit: Payer: Self-pay | Admitting: Internal Medicine

## 2024-05-17 MED ORDER — METOPROLOL SUCCINATE ER 50 MG PO TB24: 50.0000 mg | ORAL_TABLET | Freq: Two times a day (BID) | ORAL | 0 refills | Status: AC

## 2024-05-23 ENCOUNTER — Inpatient Hospital Stay

## 2024-05-23 ENCOUNTER — Other Ambulatory Visit: Payer: Self-pay

## 2024-05-23 ENCOUNTER — Inpatient Hospital Stay (HOSPITAL_BASED_OUTPATIENT_CLINIC_OR_DEPARTMENT_OTHER): Admitting: Oncology

## 2024-05-23 VITALS — BP 137/75 | HR 66 | Temp 98.9°F | Resp 18 | Ht 72.0 in | Wt 257.9 lb

## 2024-05-23 DIAGNOSIS — C779 Secondary and unspecified malignant neoplasm of lymph node, unspecified: Secondary | ICD-10-CM | POA: Diagnosis present

## 2024-05-23 DIAGNOSIS — Z5111 Encounter for antineoplastic chemotherapy: Secondary | ICD-10-CM | POA: Diagnosis present

## 2024-05-23 DIAGNOSIS — C180 Malignant neoplasm of cecum: Secondary | ICD-10-CM

## 2024-05-23 DIAGNOSIS — Z79899 Other long term (current) drug therapy: Secondary | ICD-10-CM | POA: Diagnosis not present

## 2024-05-23 LAB — CMP (CANCER CENTER ONLY)
ALT: 31 U/L (ref 0–44)
AST: 36 U/L (ref 15–41)
Albumin: 4.1 g/dL (ref 3.5–5.0)
Alkaline Phosphatase: 93 U/L (ref 38–126)
Anion gap: 7 (ref 5–15)
BUN: 8 mg/dL (ref 8–23)
CO2: 28 mmol/L (ref 22–32)
Calcium: 8.9 mg/dL (ref 8.9–10.3)
Chloride: 102 mmol/L (ref 98–111)
Creatinine: 0.84 mg/dL (ref 0.61–1.24)
GFR, Estimated: >60 mL/min (ref >60)
Glucose, Bld: 161 mg/dL — ABNORMAL HIGH (ref 70–99)
Potassium: 4.5 mmol/L (ref 3.5–5.1)
Sodium: 137 mmol/L (ref 135–145)
Total Bilirubin: 0.7 mg/dL (ref 0.0–1.2)
Total Protein: 7.3 g/dL (ref 6.5–8.1)

## 2024-05-23 LAB — CBC WITH DIFFERENTIAL (CANCER CENTER ONLY)
Abs Immature Granulocytes: 0.03 K/uL (ref 0.00–0.07)
Basophils Absolute: 0.1 K/uL (ref 0.0–0.1)
Basophils Relative: 2 %
Eosinophils Absolute: 0.1 K/uL (ref 0.0–0.5)
Eosinophils Relative: 2 %
HCT: 42.4 % (ref 39.0–52.0)
Hemoglobin: 14.2 g/dL (ref 13.0–17.0)
Immature Granulocytes: 1 %
Lymphocytes Relative: 26 %
Lymphs Abs: 1.1 K/uL (ref 0.7–4.0)
MCH: 29.2 pg (ref 26.0–34.0)
MCHC: 33.5 g/dL (ref 30.0–36.0)
MCV: 87.1 fL (ref 80.0–100.0)
Monocytes Absolute: 0.4 K/uL (ref 0.1–1.0)
Monocytes Relative: 10 %
Neutro Abs: 2.6 K/uL (ref 1.7–7.7)
Neutrophils Relative %: 59 %
Platelet Count: 164 K/uL (ref 150–400)
RBC: 4.87 MIL/uL (ref 4.22–5.81)
RDW: 13.9 % (ref 11.5–15.5)
WBC Count: 4.3 K/uL (ref 4.0–10.5)
nRBC: 0 % (ref 0.0–0.2)

## 2024-05-23 LAB — GUARDANT 360

## 2024-05-23 MED ORDER — DEXAMETHASONE SOD PHOSPHATE PF 10 MG/ML IJ SOLN
10.0000 mg | Freq: Once | INTRAMUSCULAR | Status: AC
  Administered 2024-05-23: 10 mg via INTRAVENOUS

## 2024-05-23 MED ORDER — PANTOPRAZOLE SODIUM 40 MG PO TBEC: 40.0000 mg | DELAYED_RELEASE_TABLET | Freq: Every day | ORAL | 5 refills | Status: AC

## 2024-05-23 MED ORDER — PALONOSETRON HCL INJECTION 0.25 MG/5ML
0.2500 mg | Freq: Once | INTRAVENOUS | Status: AC
  Administered 2024-05-23: 0.25 mg via INTRAVENOUS
  Filled 2024-05-23: qty 5

## 2024-05-23 MED ORDER — LEUCOVORIN CALCIUM INJECTION 350 MG
400.0000 mg/m2 | Freq: Once | INTRAVENOUS | Status: AC
  Administered 2024-05-23: 972 mg via INTRAVENOUS
  Filled 2024-05-23: qty 48.6

## 2024-05-23 MED ORDER — SODIUM CHLORIDE 0.9 % IV SOLN
2400.0000 mg/m2 | INTRAVENOUS | Status: DC
  Administered 2024-05-23: 5850 mg via INTRAVENOUS
  Filled 2024-05-23: qty 117

## 2024-05-23 MED ORDER — DEXTROSE 5 % IV SOLN: INTRAVENOUS | Status: DC

## 2024-05-23 MED ORDER — OXALIPLATIN CHEMO INJECTION 100 MG/20ML
85.0000 mg/m2 | Freq: Once | INTRAVENOUS | Status: AC
  Administered 2024-05-23: 200 mg via INTRAVENOUS
  Filled 2024-05-23: qty 40

## 2024-05-23 MED ORDER — FLUOROURACIL CHEMO INJECTION 2.5 GM/50ML
400.0000 mg/m2 | Freq: Once | INTRAVENOUS | Status: AC
  Administered 2024-05-23: 1000 mg via INTRAVENOUS
  Filled 2024-05-23: qty 20

## 2024-05-23 NOTE — Progress Notes (Signed)
 [facility]  ONCOLOGY CLINIC PROGRESS NOTE   Patient Care Team: Barbra Odor, NP as PCP - General (Nurse Practitioner) Waddell Danelle ORN, MD as PCP - Electrophysiology (Cardiology) Raford Riggs, MD as PCP - Cardiology (Cardiology) Milissa Clack, MD as Referring Physician (Physical Medicine and Rehabilitation) Debby Hila, MD as Consulting Physician (General Surgery)  PATIENT NAME: Caleb [name]   MR#: [id number] DOB: 11-09-1960  Date of visit: 05/23/2024   ASSESSMENT & PLAN:   Caleb [name] is a 63 y.o.  gentleman with a past medical history of hypertension, diabetes mellitus type 2, dyslipidemia, atrial flutter status post ablation, was referred to our clinic for newly diagnosed adenocarcinoma of the cecum, stage III, MMR proficient, status post hemicolectomy on 04/12/2024.   Adenocarcinoma of cecum [facility]) Please review oncology history for additional details and timeline of events.  Though the biopsy from colonoscopy in July 2025 did not show evidence of malignancy, given the size of the mass and clinical picture, Dr. Debby recommended proceeding with robotic assisted right colectomy.  After obtaining cardiac clearance, Dr. Debby performed robotic right colectomy on 04/12/2024.  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  Submucosal invasion was not identified.  Metastatic adenocarcinoma was noted in 2 out of 36 lymph nodes.  Margins were negative for carcinoma or dysplasia.  No LVI, PNI. pTis,pN1b,cM0, Stage III A disease.  MMR proficient.  Staging CT chest on 05/02/2024 showed no evidence of intrathoracic metastatic disease.  Previously CT abdomen and pelvis on 01/29/2024 showed no evidence of metastatic disease.  Today I discussed staging, prognosis, plan of care, treatment options.  Reviewed NCCN guidelines.  Stage III colon cancer located in the cecum, status post resection with negative margins.    Chemotherapy is recommended to prevent recurrence due to the risk of microscopic disease. Two chemotherapy options were discussed: FOLFOX regimen with a pump every two weeks or CAPEOX regimen with oral pills every three weeks. The FOLFOX regimen is preferred initially, with the option to switch to CAPEOX if needed due to cardiac history. Common side effects of chemotherapy include nausea, vomiting, fatigue, low blood counts, tingling, numbness, cold sensitivity, and diarrhea. The goal of chemotherapy is to prevent recurrence and aim for a complete cure.  - Initiate FOLFOX chemotherapy regimen with a pump every two weeks for 3-6 months. - Arrange for port-a-cath placement for chemotherapy administration. - Conduct blood work and scans every three months for the first two years, then space out.  - Perform circulating cancer DNA test every three months.  We did proceed with guardant reveal test today along with Guardant 360 to look for any actionable mutations.  In case of PIK3CA mutation, we will start him on aspirin 100 mg daily.  - Schedule colonoscopy one year post-surgery.  - Monitor for cardiac arrhythmias due to chemotherapy.  - Advise on dietary modifications to avoid greasy and spicy foods.  - Limit alcohol intake to two beers, not more than 1 day a week, and avoid around chemotherapy days.   Heartburn (gastroesophageal reflux symptoms) Reports heartburn almost every night, possibly exacerbated by chemotherapy. Previously diagnosed with acid reflux during a colonoscopy and upper GI evaluation. Currently not taking omeprazole regularly. - Prescribed pantoprazole to be taken on an empty stomach in the morning. - Advised to take Tums if dexamethasone  causes heartburn.  Atrial flutter, status post ablation, on flecainide  Atrial flutter, status post two ablations with the second being successful. Currently on flecainide . Chemotherapy may increase the  risk of cardiac arrhythmias,  particularly with the FOLFOX regimen. - Monitor cardiac status closely during chemotherapy. - Consider switching to CAPOX regimen if cardiac issues arise.   Dry skin (xerosis cutis) Dry skin, which may be exacerbated by chemotherapy. No current use of moisturizers. - Advise use of moisturizers regularly, especially during chemotherapy. - Consider Benadryl cream for itching if needed.  I reviewed lab results and outside records for this visit and discussed relevant results with the patient. Diagnosis, plan of care and treatment options were also discussed in detail with the patient. Opportunity provided to ask questions and answers provided to his apparent satisfaction. Provided instructions to call our clinic with any problems, questions or concerns prior to return visit. I recommended to continue follow-up with PCP and sub-specialists. He verbalized understanding and agreed with the plan.   NCCN guidelines have been consulted in the planning of this patient's care.  I spent a total of 42 minutes during this encounter with the patient including review of chart and various tests results, discussions about plan of care and coordination of care plan.   [name], MD  05/23/2024  [facility] [address] [place] [address] Dept: [phone] Dept Fax: [phone]    CHIEF COMPLAINT/ REASON FOR VISIT:   Adenocarcinoma of the cecum, stage III, MMR proficient, status post hemicolectomy on 04/12/2024.   Current Treatment: Started adjuvant systemic chemotherapy with FOLFOX from 05/23/2024.  INTERVAL HISTORY:    Discussed the use of AI scribe software for clinical note transcription with the patient, who gave verbal consent to proceed.  History of Present Illness Caleb [name] is a 63 year old male who presents for chemotherapy initiation.  He recently had a port placed last week and  notes it is 'a little sore'. He is starting his first cycle of chemotherapy today.  He has been experiencing heartburn almost every night since a recent colonoscopy and upper GI procedure where acid was found. He has been prescribed omeprazole but has not been taking it regularly.  He is also being monitored for atrial fibrillation or flutter.   I have reviewed the past medical history, past surgical history, social history and family history with the patient and they are unchanged from previous note.  HISTORY OF PRESENT ILLNESS:   ONCOLOGY HISTORY:   Patient had a screening colonoscopy when he was 63 years old which showed some benign polyps.  He did not have a follow-up colonoscopy after that.   He had mild anemia in June 2025 with hemoglobin of 12.1, MCV 76.  His PCP obtained additional workup including Cologuard test.  Cologuard came back positive.   He had a colonoscopy on 01/22/2024, performed by Dr. Abran.  It showed 12 mm apparently did polyp in the rectum, 4 polyps in the descending colon and transverse colon measuring 3 to 8 mm in size.  In the cecum there was a 4 to 5 cm soft polypoid mass which was friable.  This was biopsied with cold forceps.  In addition there were 4 PET included in semipedunculated polyps measuring between 10 and 25 mm.  This area was tattooed.  Multiple diverticula were noted in the colon.  Internal hemorrhoids.  He had an EGD that day which showed evidence of esophagitis and stricture.  Hiatal hernia was noted.  Question portal hypertensive gastropathy.   Pathology showed superficial fragments of tubulovillous adenoma from the  cecal mass.  It was negative for high-grade dysplasia or carcinoma.  Rest of the polyps were either hyperplastic or tubular adenomas without evidence of malignancy or high-grade dysplasia.   On 01/29/2024, CT abdomen and pelvis was obtained for further evaluation which showed what appeared to be 4.3 cm intraluminal soft tissue density in  the ascending colon, highly suspicious for polypoid intraluminal mass.  No evidence of metastatic disease.  Colonic diverticulosis was noted.  Probable hepatic cirrhosis was also noted.   With these findings, patient was referred to Dr. Bernarda Ned, general surgery for further evaluation.  Though the biopsy did not show evidence of malignancy, given the size of the mass and clinical picture, Dr. Ned recommended proceeding with robotic assisted right colectomy.   After obtaining cardiac clearance, Dr. Ned performed robotic right colectomy on 04/12/2024.  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  Submucosal invasion was not identified.  Metastatic adenocarcinoma was noted in 2 out of 36 lymph nodes.  Margins were negative for carcinoma or dysplasia.  No LVI, PNI. pTis,pN1b,cM0, Stage III A disease.  MMR proficient.   Staging CT chest on 05/02/2024 showed no evidence of intrathoracic metastatic disease.   Given lymph node positive adenocarcinoma of the cecum, recommended adjuvant chemotherapy with FOLFOX regimen.  We will watch him closely for any development of cardiac arrhythmias.  Started cycle 1 of FOLFOX from 05/23/2024.  Oncology History  Adenocarcinoma of cecum (HCC)  05/03/2024 Initial Diagnosis   Adenocarcinoma of cecum (HCC)   05/03/2024 Cancer Staging   Staging form: Colon and Rectum, AJCC 8th Edition - Clinical: Stage IIIA (cT1, cN1b, cM0) - Signed by Autumn Millman, MD on 05/03/2024 Histologic grade (G): G1 Histologic grading system: 4 grade system   05/23/2024 -  Chemotherapy   Patient is on Treatment Plan : COLORECTAL FOLFOX q14d x 3 months         REVIEW OF SYSTEMS:   Review of Systems - Oncology  All other pertinent systems were reviewed with the patient and are negative.  ALLERGIES: He has no known allergies.  MEDICATIONS:  Current Outpatient Medications  Medication Sig Dispense Refill   atorvastatin (LIPITOR) 20  MG tablet Take 20 mg by mouth daily.     Blood Glucose Monitoring Suppl (TRUE METRIX AIR GLUCOSE METER) DEVI Check CBG BID 1 each 11   colchicine 0.6 MG tablet Take 0.6 mg by mouth as needed (gout).     flecainide  (TAMBOCOR ) 100 MG tablet TAKE 1 TABLET BY MOUTH EVERY 12 HOURS 30 tablet 0   metFORMIN  (GLUCOPHAGE ) 500 MG tablet Take 500-1,000 mg by mouth See admin instructions. Take 1000 mg in the morning and 500 mg in the evening     metoprolol  succinate (TOPROL -XL) 50 MG 24 hr tablet Take 1 tablet (50 mg total) by mouth 2 (two) times daily. 30 tablet 0   pantoprazole (PROTONIX) 40 MG tablet Take 1 tablet (40 mg total) by mouth daily. 30 tablet 5   traMADol  (ULTRAM ) 50 MG tablet Take 1 tablet (50 mg total) by mouth every 6 (six) hours as needed for moderate pain (pain score 4-6) or severe pain (pain score 7-10). 30 tablet 0   dexamethasone  (DECADRON ) 4 MG tablet Take 2 tablets (8 mg total) by mouth daily. Start the day after chemotherapy for 2 days. Take with food. (Patient not taking: Reported on 05/23/2024) 30 tablet 1   lidocaine -prilocaine (EMLA) cream Apply to affected area once (Patient not taking: Reported on 05/23/2024) 30 g 3  ondansetron  (ZOFRAN ) 8 MG tablet Take 1 tablet (8 mg total) by mouth every 8 (eight) hours as needed for nausea or vomiting. Start on the third day after chemotherapy. (Patient not taking: Reported on 05/23/2024) 30 tablet 1   prochlorperazine (COMPAZINE) 10 MG tablet Take 1 tablet (10 mg total) by mouth every 6 (six) hours as needed for nausea or vomiting. (Patient not taking: Reported on 05/23/2024) 30 tablet 1   tadalafil (CIALIS) 20 MG tablet Take 20 mg by mouth daily as needed for erectile dysfunction. (Patient not taking: Reported on 05/23/2024)     No current facility-administered medications for this visit.     VITALS:   Blood pressure 137/75, pulse 66, temperature 98.9 F (37.2 C), temperature source Temporal, resp. rate 18, height 6' (1.829 m), weight  257 lb 14.4 oz (117 kg), SpO2 97%.  Wt Readings from Last 3 Encounters:  05/23/24 257 lb 14.4 oz (117 kg)  05/16/24 255 lb 4.7 oz (115.8 kg)  05/03/24 255 lb 6.4 oz (115.8 kg)    Body mass index is 34.98 kg/m.   Onc Performance Status - 05/27/24 1300       ECOG Perf Status   ECOG Perf Status Fully active, able to carry on all pre-disease performance without restriction      KPS SCALE   KPS % SCORE Normal, no compliants, no evidence of disease          PHYSICAL EXAM:   Physical Exam Constitutional:      General: He is not in acute distress.    Appearance: Normal appearance.  HENT:     Head: Normocephalic and atraumatic.  Eyes:     Conjunctiva/sclera: Conjunctivae normal.  Cardiovascular:     Rate and Rhythm: Normal rate and regular rhythm.  Pulmonary:     Effort: Pulmonary effort is normal. No respiratory distress.  Chest:     Comments: Port-A-Cath in place without any signs of infection Abdominal:     General: There is no distension.     Comments: Scars from recent surgery have healed well  Neurological:     General: No focal deficit present.     Mental Status: He is alert and oriented to person, place, and time.  Psychiatric:        Mood and Affect: Mood normal.        Behavior: Behavior normal.      LABORATORY DATA:   I have reviewed the data as listed.  Results for orders placed or performed in visit on 05/23/24  CMP (Cancer Center only)  Result Value Ref Range   Sodium 137 135 - 145 mmol/L   Potassium 4.5 3.5 - 5.1 mmol/L   Chloride 102 98 - 111 mmol/L   CO2 28 22 - 32 mmol/L   Glucose, Bld 161 (H) 70 - 99 mg/dL   BUN 8 8 - 23 mg/dL   Creatinine 9.15 9.38 - 1.24 mg/dL   Calcium 8.9 8.9 - 89.6 mg/dL   Total Protein 7.3 6.5 - 8.1 g/dL   Albumin  4.1 3.5 - 5.0 g/dL   AST 36 15 - 41 U/L   ALT 31 0 - 44 U/L   Alkaline Phosphatase 93 38 - 126 U/L   Total Bilirubin 0.7 0.0 - 1.2 mg/dL   GFR, Estimated >39 >39 mL/min   Anion gap 7 5 - 15  CBC with  Differential (Cancer Center Only)  Result Value Ref Range   WBC Count 4.3 4.0 - 10.5 K/uL   RBC  4.87 4.22 - 5.81 MIL/uL   Hemoglobin 14.2 13.0 - 17.0 g/dL   HCT 57.5 60.9 - 47.9 %   MCV 87.1 80.0 - 100.0 fL   MCH 29.2 26.0 - 34.0 pg   MCHC 33.5 30.0 - 36.0 g/dL   RDW 86.0 88.4 - 84.4 %   Platelet Count 164 150 - 400 K/uL   nRBC 0.0 0.0 - 0.2 %   Neutrophils Relative % 59 %   Neutro Abs 2.6 1.7 - 7.7 K/uL   Lymphocytes Relative 26 %   Lymphs Abs 1.1 0.7 - 4.0 K/uL   Monocytes Relative 10 %   Monocytes Absolute 0.4 0.1 - 1.0 K/uL   Eosinophils Relative 2 %   Eosinophils Absolute 0.1 0.0 - 0.5 K/uL   Basophils Relative 2 %   Basophils Absolute 0.1 0.0 - 0.1 K/uL   Immature Granulocytes 1 %   Abs Immature Granulocytes 0.03 0.00 - 0.07 K/uL      RADIOGRAPHIC STUDIES:  I have personally reviewed the radiological images as listed and agree with the findings in the report.  IR IMAGING GUIDED PORT INSERTION CLINICAL DATA:  Adenocarcinoma of the cecum.  EXAM: Chest port catheter placement  TECHNIQUE: Procedure performed using fluoroscopy and ultrasound  CONTRAST:  None  RADIOPHARMACEUTICALS:  None  FLUOROSCOPY: 0.2 minutes 2.1 mGy  COMPARISON:  None  FINDINGS: The patient was placed in supine position on the IR gantry and the right upper chest and neck were prepped and draped in the usual sterile fashion. The nurse administered intravenous fentanyl  and Versed  under my supervision and the nurse had no other injuries other than monitoring the patient and administering medications. I was present for the entire duration of procedure. 3 mg intravenous Versed , 100 mcg intravenous fentanyl , and 25 mg intravenous Benadryl were administered for a total sedation time of 16 minutes.  Ultrasound guidance was used to investigate the right internal jugular vein which was anechoic and compressible indicating patency. The needle was then advanced from a scan negative through the  soft tissue into the right internal jugular vein under ultrasound guidance. A final image was obtained and stored in the patient's permanent medical record.  Access was then exchanged over a guidewire which was advanced under fluoroscopic guidance. The needle was removed and replaced with a micropuncture sheath. Approximately 2 inches below the clavicle the port pocket was created with a subsequent incision. The catheter was then tunneled from the port pocket to the venotomy site overlying the right internal jugular vein. Access was then exchanged over an 035 guidewire for peel-away sheath which was advanced over the guidewire under fluoroscopic guidance. The catheter was then advanced through the peel-away sheath to the sinoatrial junction. Sheath was removed. The catheter was then cut at the port pocket and connected to chest port. The chest port was tested for function and finally function well. The chest port was then flushed with heparin  and a port pocket was closed with 4-0 suture. Final image was obtained demonstrating satisfactory position of chest port. The final count of all materials was satisfactory.  IMPRESSION: 1. Satisfactory placement of right internal jugular vein single-lumen chest port. Catheter tip is at the cavoatrial junction.  2.  Okay to use and power inject chest port.  Electronically Signed   By: Cordella Banner   On: 05/16/2024 16:31    CODE STATUS:  Code Status History     Date Active Date Inactive Code Status Order ID Comments User Context   05/16/2024 1451  05/17/2024 0521 Full Code [id number]  Jenna Cordella LABOR, MD HOV   04/12/2024 1523 04/13/2024 1633 Full Code [id number]  Debby Hila, MD Inpatient   09/29/2021 1048 09/29/2021 2301 Full Code [id number]  Waddell Danelle ORN, MD Inpatient   08/02/2021 1122 08/02/2021 2215 Full Code [id number]  Waddell Danelle ORN, MD Inpatient   09/30/2020 1930 10/01/2020 1719 Full Code [id number]  Cheryle Debby LABOR, MD Inpatient    04/03/2020 2150 04/08/2020 2149 Full Code [id number]  Shona Terry SAILOR, DO Inpatient    Questions for Most Recent Historical Code Status (Order [id number])     Question Answer   By: Consent: discussion documented in EHR            No orders of the defined types were placed in this encounter.    Future Appointments  Date Time Provider Department Center  06/05/2024  9:45 AM [facility] FLUSH [facility] None  06/05/2024 10:30 AM [facility] INFUSION [facility] None  06/07/2024 12:30 PM [facility] FLUSH [facility] None     This document was completed utilizing speech recognition software. Grammatical errors, random word insertions, pronoun errors, and incomplete sentences are an occasional consequence of this system due to software limitations, ambient noise, and hardware issues. Any formal questions or concerns about the content, text or information contained within the body of this dictation should be directly addressed to the provider for clarification.

## 2024-05-23 NOTE — Patient Instructions (Signed)
 [facility] - A DEPT OF [facility]   Discharge Instructions: Thank you for choosing [facility] to provide your oncology and hematology care.   If you have a lab appointment with the Cancer Center, please go directly to the Cancer Center and check in at the registration area.   Wear comfortable clothing and clothing appropriate for easy access to any Portacath or PICC line.   We strive to give you quality time with your provider. You may need to reschedule your appointment if you arrive late (15 or more minutes).  Arriving late affects you and other patients whose appointments are after yours.  Also, if you miss three or more appointments without notifying the office, you may be dismissed from the clinic at the provider's discretion.      For prescription refill requests, have your pharmacy contact our office and allow 72 hours for refills to be completed.    Today you received the following chemotherapy and/or immunotherapy agents: Oxaliplatin, Leucovorin, Fluorouracil (Adrucil)      To help prevent nausea and vomiting after your treatment, we encourage you to take your nausea medication as directed.  BELOW ARE SYMPTOMS THAT SHOULD BE REPORTED IMMEDIATELY: *FEVER GREATER THAN 100.4 F (38 C) OR HIGHER *CHILLS OR SWEATING *NAUSEA AND VOMITING THAT IS NOT CONTROLLED WITH YOUR NAUSEA MEDICATION *UNUSUAL SHORTNESS OF BREATH *UNUSUAL BRUISING OR BLEEDING *URINARY PROBLEMS (pain or burning when urinating, or frequent urination) *BOWEL PROBLEMS (unusual diarrhea, constipation, pain near the anus) TENDERNESS IN MOUTH AND THROAT WITH OR WITHOUT PRESENCE OF ULCERS (sore throat, sores in mouth, or a toothache) UNUSUAL RASH, SWELLING OR PAIN  UNUSUAL VAGINAL DISCHARGE OR ITCHING   Items with * indicate a potential emergency and should be followed up as soon as possible or go to the Emergency Department if any problems should occur.  Please show the  CHEMOTHERAPY ALERT CARD or IMMUNOTHERAPY ALERT CARD at check-in to the Emergency Department and triage nurse.  Should you have questions after your visit or need to cancel or reschedule your appointment, please contact [facility]  Dept: [phone]  and follow the prompts.  Office hours are 8:00 a.m. to 4:30 p.m. Monday - Friday. Please note that voicemails left after 4:00 p.m. may not be returned until the following business day.  We are closed weekends and major holidays. You have access to a nurse at all times for urgent questions. Please call the main number to the clinic Dept: [phone] and follow the prompts.   For any non-urgent questions, you may also contact your provider using MyChart. We now offer e-Visits for anyone 45 and older to request care online for non-urgent symptoms. For details visit [email or web address].   Also download the MyChart app! Go to the app store, search MyChart, open the app, select [place], and log in with your MyChart username and password.   The chemotherapy medication bag should finish at 46 hours, 96 hours, or 7 days. For example, if your pump is scheduled for 46 hours and it was put on at 4:00 p.m., it should finish at 2:00 p.m. the day it is scheduled to come off regardless of your appointment time.     Estimated time to finish at    If the display on your pump reads Low Volume and it is beeping, take the batteries out of the pump and come to the  cancer center for it to be taken off.   If the pump alarms go off prior to the pump reading Low Volume then call [phone] and someone can assist you.  If the plunger comes out and the chemotherapy medication is leaking out, please use your home chemo spill kit to clean up the spill. Do NOT use paper towels or other household products.  If you have problems or questions regarding your pump, please call either [phone] (24 hours a  day) or the cancer center Monday-Friday 8:00 a.m.- 4:30 p.m. at the clinic number and we will assist you. If you are unable to get assistance, then go to the nearest Emergency Department and ask the staff to contact the IV team for assistance.

## 2024-05-23 NOTE — Assessment & Plan Note (Addendum)
 Please review oncology history for additional details and timeline of events.  Though the biopsy from colonoscopy in July 2025 did not show evidence of malignancy, given the size of the mass and clinical picture, Dr. Debby recommended proceeding with robotic assisted right colectomy.  After obtaining cardiac clearance, Dr. Debby performed robotic right colectomy on 04/12/2024.  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  Submucosal invasion was not identified.  Metastatic adenocarcinoma was noted in 2 out of 36 lymph nodes.  Margins were negative for carcinoma or dysplasia.  No LVI, PNI. pTis,pN1b,cM0, Stage III A disease.  MMR proficient.  Staging CT chest on 05/02/2024 showed no evidence of intrathoracic metastatic disease.  Previously CT abdomen and pelvis on 01/29/2024 showed no evidence of metastatic disease.  Previously I discussed staging, prognosis, plan of care, treatment options.  Reviewed NCCN guidelines.  Stage III colon cancer located in the cecum, status post resection with negative margins.   Chemotherapy is recommended to prevent recurrence due to the risk of microscopic disease. Two chemotherapy options were discussed: FOLFOX regimen with a pump every two weeks or CAPEOX regimen with oral pills every three weeks. The FOLFOX regimen is preferred initially, with the option to switch to CAPEOX if needed due to cardiac history. Common side effects of chemotherapy include nausea, vomiting, fatigue, low blood counts, tingling, numbness, cold sensitivity, and diarrhea. The goal of chemotherapy is to prevent recurrence and aim for a complete cure.  - We did proceed with guardant reveal test on 05/03/2024 and it showed no evidence of circulating tumor DNA.  We also checked Guardant 360 to look for any actionable mutations.  In case of PIK3CA mutation, we will start him on aspirin 100 mg daily.  Plan is to proceed with adjuvant systemic chemotherapy with  FOLFOX.  He completed chemo education and had Port-A-Cath placed.  Labs today reveal no dose-limiting toxicities.  Will proceed with cycle 1 of FOLFOX today.  - Schedule colonoscopy one year post-surgery.  - Monitor for cardiac arrhythmias due to chemotherapy.  - Advise on dietary modifications to avoid greasy and spicy foods.  - Limit alcohol intake to two beers, not more than 1 day a week, and avoid around chemotherapy days.  RTC in 1 week for toxicity evaluation.

## 2024-05-24 NOTE — Progress Notes (Signed)
 PATIENT NAVIGATOR PROGRESS NOTE  Name: Caleb [name] Date: 05/24/2024 MRN: [id number]  DOB: 02/01/1961  Patient is established with a treatment plan and is actively engaged in care. Nurse Navigator services not currently indicated at this time. Will re-evaluate if needs change or if additional support is requested.

## 2024-05-25 ENCOUNTER — Inpatient Hospital Stay

## 2024-05-27 ENCOUNTER — Encounter: Payer: Self-pay | Admitting: Oncology

## 2024-05-27 ENCOUNTER — Telehealth: Payer: Self-pay

## 2024-05-27 NOTE — Telephone Encounter (Signed)
-----   Message from Nurse Rocky HERO sent at 05/23/2024  4:41 PM EST ----- Regarding: First Time Folfox - Dr. Autumn Patient First Time Folfox - Dr. Autumn Patient  Patient tolerated treatment well.

## 2024-05-27 NOTE — Telephone Encounter (Signed)
 LM for patient that this nurse was calling to see how they were doing after their treatment. Please call back to Dr. Zenda Alpers nurse at [phone] if they have any questions or concerns regarding the treatment.

## 2024-05-28 ENCOUNTER — Other Ambulatory Visit: Payer: Self-pay | Admitting: Internal Medicine

## 2024-05-29 ENCOUNTER — Other Ambulatory Visit: Payer: Self-pay

## 2024-05-29 DIAGNOSIS — C180 Malignant neoplasm of cecum: Secondary | ICD-10-CM

## 2024-05-30 ENCOUNTER — Inpatient Hospital Stay: Admitting: Oncology

## 2024-05-30 ENCOUNTER — Inpatient Hospital Stay

## 2024-05-30 ENCOUNTER — Encounter: Payer: Self-pay | Admitting: Oncology

## 2024-05-30 VITALS — BP 145/71 | HR 61 | Temp 97.9°F | Resp 16 | Ht 72.0 in | Wt 254.0 lb

## 2024-05-30 DIAGNOSIS — C180 Malignant neoplasm of cecum: Secondary | ICD-10-CM | POA: Diagnosis not present

## 2024-05-30 DIAGNOSIS — Z5111 Encounter for antineoplastic chemotherapy: Secondary | ICD-10-CM | POA: Diagnosis not present

## 2024-05-30 LAB — CBC WITH DIFFERENTIAL (CANCER CENTER ONLY)
Abs Immature Granulocytes: 0 K/uL (ref 0.00–0.07)
Basophils Absolute: 0 K/uL (ref 0.0–0.1)
Basophils Relative: 1 %
Eosinophils Absolute: 0.1 K/uL (ref 0.0–0.5)
Eosinophils Relative: 3 %
HCT: 40.1 % (ref 39.0–52.0)
Hemoglobin: 13.6 g/dL (ref 13.0–17.0)
Immature Granulocytes: 0 %
Lymphocytes Relative: 33 %
Lymphs Abs: 1.1 K/uL (ref 0.7–4.0)
MCH: 29.5 pg (ref 26.0–34.0)
MCHC: 33.9 g/dL (ref 30.0–36.0)
MCV: 87 fL (ref 80.0–100.0)
Monocytes Absolute: 0.4 K/uL (ref 0.1–1.0)
Monocytes Relative: 11 %
Neutro Abs: 1.7 K/uL (ref 1.7–7.7)
Neutrophils Relative %: 52 %
Platelet Count: 142 K/uL — ABNORMAL LOW (ref 150–400)
RBC: 4.61 MIL/uL (ref 4.22–5.81)
RDW: 13.7 % (ref 11.5–15.5)
WBC Count: 3.3 K/uL — ABNORMAL LOW (ref 4.0–10.5)
nRBC: 0 % (ref 0.0–0.2)

## 2024-05-30 LAB — CMP (CANCER CENTER ONLY)
ALT: 34 U/L (ref 0–44)
AST: 48 U/L — ABNORMAL HIGH (ref 15–41)
Albumin: 4.2 g/dL (ref 3.5–5.0)
Alkaline Phosphatase: 86 U/L (ref 38–126)
Anion gap: 10 (ref 5–15)
BUN: 11 mg/dL (ref 8–23)
CO2: 26 mmol/L (ref 22–32)
Calcium: 9.3 mg/dL (ref 8.9–10.3)
Chloride: 103 mmol/L (ref 98–111)
Creatinine: 0.88 mg/dL (ref 0.61–1.24)
GFR, Estimated: >60 mL/min (ref >60)
Glucose, Bld: 130 mg/dL — ABNORMAL HIGH (ref 70–99)
Potassium: 4.3 mmol/L (ref 3.5–5.1)
Sodium: 139 mmol/L (ref 135–145)
Total Bilirubin: 0.5 mg/dL (ref 0.0–1.2)
Total Protein: 7.1 g/dL (ref 6.5–8.1)

## 2024-05-30 NOTE — Progress Notes (Signed)
 [facility]  ONCOLOGY CLINIC PROGRESS NOTE   Patient Care Team: Barbra Odor, NP as PCP - General (Nurse Practitioner) Waddell Danelle ORN, MD as PCP - Electrophysiology (Cardiology) Raford Riggs, MD as PCP - Cardiology (Cardiology) Milissa Clack, MD as Referring Physician (Physical Medicine and Rehabilitation) Debby Hila, MD as Consulting Physician (General Surgery)  PATIENT NAME: Caleb [name]   MR#: [id number] DOB: 08/25/1960  Date of visit: 05/30/2024   ASSESSMENT & PLAN:   Caleb [name] is a 63 y.o.  gentleman with a past medical history of hypertension, diabetes mellitus type 2, dyslipidemia, atrial flutter status post ablation, was referred to our clinic for newly diagnosed adenocarcinoma of the cecum, stage III, MMR proficient, status post hemicolectomy on 04/12/2024.   Adenocarcinoma of cecum [facility]) Please review oncology history for additional details and timeline of events.  Though the biopsy from colonoscopy in July 2025 did not show evidence of malignancy, given the size of the mass and clinical picture, Dr. Debby recommended proceeding with robotic assisted right colectomy.  After obtaining cardiac clearance, Dr. Debby performed robotic right colectomy on 04/12/2024.  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  Submucosal invasion was not identified.  Metastatic adenocarcinoma was noted in 2 out of 36 lymph nodes.  Margins were negative for carcinoma or dysplasia.  No LVI, PNI. pTis,pN1b,cM0, Stage III A disease.  MMR proficient.  Staging CT chest on 05/02/2024 showed no evidence of intrathoracic metastatic disease.  Previously CT abdomen and pelvis on 01/29/2024 showed no evidence of metastatic disease.  Previously I discussed staging, prognosis, plan of care, treatment options.  Reviewed NCCN guidelines.  Stage III colon cancer located in the cecum, status post resection with negative margins.    Chemotherapy is recommended to prevent recurrence due to the risk of microscopic disease. Two chemotherapy options were discussed: FOLFOX regimen with a pump every two weeks or CAPEOX regimen with oral pills every three weeks. The FOLFOX regimen is preferred initially, with the option to switch to CAPEOX if needed due to cardiac history. Common side effects of chemotherapy include nausea, vomiting, fatigue, low blood counts, tingling, numbness, cold sensitivity, and diarrhea. The goal of chemotherapy is to prevent recurrence and aim for a complete cure.  - We did proceed with guardant reveal test on 05/03/2024 and it showed no evidence of circulating tumor DNA.  We also checked Guardant 360 to look for any actionable mutations.  In case of PIK3CA mutation, we will start him on aspirin 100 mg daily.  Plan made to proceed with adjuvant systemic chemotherapy with FOLFOX.  Started from 05/23/2024.  Tolerated cycle 1 of chemotherapy reasonably well without any major side effects.  Labs today reveal mild leukopenia with white count of 3300, normal differential.  Overall no dose-limiting toxicities.  - Schedule colonoscopy one year post-surgery.  - Monitor for cardiac arrhythmias due to chemotherapy.  - Advise on dietary modifications to avoid greasy and spicy foods.  - Limit alcohol intake to two beers, not more than 1 day a week, and avoid around chemotherapy days.  RTC in 1 week for labs and cycle 2 of chemotherapy.  RTC in 3 weeks for labs, office visit and cycle 3 of chemotherapy.  Chemotherapy-induced neutropenia White blood cell count decreased to 3,300, consistent with chemotherapy-induced neutropenia. Previous count was 4,300. Other blood counts are normal. - Continue to monitor white blood cell count closely  Chemotherapy-induced peripheral neuropathy Developed cold sensitivity in fingers and mouth, a common  side effect of chemotherapy. Symptoms may persist during chemotherapy breaks  but are expected to resolve post-treatment. Tingling and numbness may limit chemotherapy dosage. - Continue to monitor neuropathy symptoms and adjust chemotherapy dosage if necessary  Gastroesophageal reflux disease Symptoms improved with pantoprazole.  - Continue pantoprazole  Atrial flutter, status post ablation, on flecainide  Atrial flutter, status post two ablations with the second being successful. Currently on flecainide . Chemotherapy may increase the risk of cardiac arrhythmias, particularly with the FOLFOX regimen. - Monitor cardiac status closely during chemotherapy. - Consider switching to CAPOX regimen if cardiac issues arise.    I reviewed lab results and outside records for this visit and discussed relevant results with the patient. Diagnosis, plan of care and treatment options were also discussed in detail with the patient. Opportunity provided to ask questions and answers provided to his apparent satisfaction. Provided instructions to call our clinic with any problems, questions or concerns prior to return visit. I recommended to continue follow-up with PCP and sub-specialists. He verbalized understanding and agreed with the plan.   NCCN guidelines have been consulted in the planning of this patient's care.  I spent a total of 32 minutes during this encounter with the patient including review of chart and various tests results, discussions about plan of care and coordination of care plan.   [name], MD  05/30/2024  [facility] - A DEPT OF [facility] [address] [place] [address] Dept: [phone] Dept Fax: [phone]    CHIEF COMPLAINT/ REASON FOR VISIT:   Adenocarcinoma of the cecum, stage III, MMR proficient, status post hemicolectomy on 04/12/2024.   Current Treatment: Started adjuvant systemic chemotherapy with FOLFOX from 05/23/2024.  INTERVAL HISTORY:    Discussed the use of AI  scribe software for clinical note transcription with the patient, who gave verbal consent to proceed.  History of Present Illness He is a 63 year old male undergoing chemotherapy who presents for follow-up after his first treatment.  He experienced nausea once in the morning after the first chemotherapy treatment, which was relieved by taking one Zofran . No further episodes of nausea or vomiting have occurred. No diarrhea or loose stools. His appetite has been variable, with periods of increased hunger and times of reduced appetite.  He reports improvement in acid reflux symptoms since starting pantoprazole.  He has developed sensitivity to cold in his fingers and mouth, particularly noticeable after chemotherapy sessions. He has been using moisturizer on his skin, including hands and feet.  His white blood cell count is slightly low at 3,300, down from 4,300 the previous week. Other blood counts are normal, and chemistry results are pending.  He has reduced his alcohol consumption to about one day a week.  No heart rate problems, chest pain, or trouble breathing.   I have reviewed the past medical history, past surgical history, social history and family history with the patient and they are unchanged from previous note.  HISTORY OF PRESENT ILLNESS:   ONCOLOGY HISTORY:   Patient had a screening colonoscopy when he was 63 years old which showed some benign polyps.  He did not have a follow-up colonoscopy after that.   He had mild anemia in June 2025 with hemoglobin of 12.1, MCV 76.  His PCP obtained additional workup including Cologuard test.  Cologuard came back positive.   He had a colonoscopy on 01/22/2024, performed by Dr. Abran.  It showed 12 mm apparently did  polyp in the rectum, 4 polyps in the descending colon and transverse colon measuring 3 to 8 mm in size.  In the cecum there was a 4 to 5 cm soft polypoid mass which was friable.  This was biopsied with cold forceps.  In addition  there were 4 PET included in semipedunculated polyps measuring between 10 and 25 mm.  This area was tattooed.  Multiple diverticula were noted in the colon.  Internal hemorrhoids.  He had an EGD that day which showed evidence of esophagitis and stricture.  Hiatal hernia was noted.  Question portal hypertensive gastropathy.   Pathology showed superficial fragments of tubulovillous adenoma from the cecal mass.  It was negative for high-grade dysplasia or carcinoma.  Rest of the polyps were either hyperplastic or tubular adenomas without evidence of malignancy or high-grade dysplasia.   On 01/29/2024, CT abdomen and pelvis was obtained for further evaluation which showed what appeared to be 4.3 cm intraluminal soft tissue density in the ascending colon, highly suspicious for polypoid intraluminal mass.  No evidence of metastatic disease.  Colonic diverticulosis was noted.  Probable hepatic cirrhosis was also noted.   With these findings, patient was referred to Dr. Bernarda Ned, general surgery for further evaluation.  Though the biopsy did not show evidence of malignancy, given the size of the mass and clinical picture, Dr. Ned recommended proceeding with robotic assisted right colectomy.   After obtaining cardiac clearance, Dr. Ned performed robotic right colectomy on 04/12/2024.  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  Submucosal invasion was not identified.  Metastatic adenocarcinoma was noted in 2 out of 36 lymph nodes.  Margins were negative for carcinoma or dysplasia.  No LVI, PNI. pTis,pN1b,cM0, Stage III A disease.  MMR proficient.   Staging CT chest on 05/02/2024 showed no evidence of intrathoracic metastatic disease.   Given lymph node positive adenocarcinoma of the cecum, recommended adjuvant chemotherapy with FOLFOX regimen.  We will watch him closely for any development of cardiac arrhythmias.  Started cycle 1 of FOLFOX from  05/23/2024.  Oncology History  Adenocarcinoma of cecum (HCC)  05/03/2024 Initial Diagnosis   Adenocarcinoma of cecum (HCC)   05/03/2024 Cancer Staging   Staging form: Colon and Rectum, AJCC 8th Edition - Clinical: Stage IIIA (cT1, cN1b, cM0) - Signed by Autumn Millman, MD on 05/03/2024 Histologic grade (G): G1 Histologic grading system: 4 grade system   05/23/2024 -  Chemotherapy   Patient is on Treatment Plan : COLORECTAL FOLFOX q14d x 3 months         REVIEW OF SYSTEMS:   Review of Systems - Oncology  All other pertinent systems were reviewed with the patient and are negative.  ALLERGIES: He has no known allergies.  MEDICATIONS:  Current Outpatient Medications  Medication Sig Dispense Refill   atorvastatin (LIPITOR) 20 MG tablet Take 20 mg by mouth daily.     Blood Glucose Monitoring Suppl (TRUE METRIX AIR GLUCOSE METER) DEVI Check CBG BID 1 each 11   colchicine 0.6 MG tablet Take 0.6 mg by mouth as needed (gout).     flecainide  (TAMBOCOR ) 100 MG tablet TAKE 1 TABLET BY MOUTH EVERY 12 HOURS 30 tablet 0   lidocaine -prilocaine (EMLA) cream Apply to affected area once 30 g 3   metFORMIN  (GLUCOPHAGE ) 500 MG tablet Take 500-1,000 mg by mouth See admin instructions. Take 1000 mg in the morning and 500 mg in the evening     metoprolol  succinate (TOPROL -XL) 50 MG 24 hr tablet  Take 1 tablet (50 mg total) by mouth 2 (two) times daily. 30 tablet 0   ondansetron  (ZOFRAN ) 8 MG tablet Take 1 tablet (8 mg total) by mouth every 8 (eight) hours as needed for nausea or vomiting. Start on the third day after chemotherapy. 30 tablet 1   pantoprazole  (PROTONIX ) 40 MG tablet Take 1 tablet (40 mg total) by mouth daily. 30 tablet 5   traMADol  (ULTRAM ) 50 MG tablet Take 1 tablet (50 mg total) by mouth every 6 (six) hours as needed for moderate pain (pain score 4-6) or severe pain (pain score 7-10). 30 tablet 0   dexamethasone  (DECADRON ) 4 MG tablet Take 2 tablets (8 mg total) by mouth daily. Start  the day after chemotherapy for 2 days. Take with food. (Patient not taking: Reported on 05/30/2024) 30 tablet 1   prochlorperazine  (COMPAZINE ) 10 MG tablet Take 1 tablet (10 mg total) by mouth every 6 (six) hours as needed for nausea or vomiting. (Patient not taking: Reported on 05/30/2024) 30 tablet 1   tadalafil (CIALIS) 20 MG tablet Take 20 mg by mouth daily as needed for erectile dysfunction. (Patient not taking: Reported on 05/30/2024)     No current facility-administered medications for this visit.     VITALS:   Blood pressure (!) 145/71, pulse 61, temperature 97.9 F (36.6 C), temperature source Temporal, resp. rate 16, height 6' (1.829 m), weight 254 lb (115.2 kg), SpO2 99%.  Wt Readings from Last 3 Encounters:  05/30/24 254 lb (115.2 kg)  05/23/24 257 lb 14.4 oz (117 kg)  05/16/24 255 lb 4.7 oz (115.8 kg)    Body mass index is 34.45 kg/m.   Onc Performance Status - 05/30/24 1308       ECOG Perf Status   ECOG Perf Status Fully active, able to carry on all pre-disease performance without restriction      KPS SCALE   KPS % SCORE Normal, no compliants, no evidence of disease          PHYSICAL EXAM:   Physical Exam Constitutional:      General: He is not in acute distress.    Appearance: Normal appearance.  HENT:     Head: Normocephalic and atraumatic.  Eyes:     Conjunctiva/sclera: Conjunctivae normal.  Cardiovascular:     Rate and Rhythm: Normal rate and regular rhythm.  Pulmonary:     Effort: Pulmonary effort is normal. No respiratory distress.  Chest:     Comments: Port-A-Cath in place without any signs of infection Abdominal:     General: There is no distension.     Comments: Scars from recent surgery have healed well  Neurological:     General: No focal deficit present.     Mental Status: He is alert and oriented to person, place, and time.  Psychiatric:        Mood and Affect: Mood normal.        Behavior: Behavior normal.      LABORATORY DATA:    I have reviewed the data as listed.  Results for orders placed or performed in visit on 05/30/24  CMP (Cancer Center only)  Result Value Ref Range   Sodium 139 135 - 145 mmol/L   Potassium 4.3 3.5 - 5.1 mmol/L   Chloride 103 98 - 111 mmol/L   CO2 26 22 - 32 mmol/L   Glucose, Bld 130 (H) 70 - 99 mg/dL   BUN 11 8 - 23 mg/dL   Creatinine 9.11 9.38 - 1.24  mg/dL   Calcium 9.3 8.9 - 89.6 mg/dL   Total Protein 7.1 6.5 - 8.1 g/dL   Albumin  4.2 3.5 - 5.0 g/dL   AST 48 (H) 15 - 41 U/L   ALT 34 0 - 44 U/L   Alkaline Phosphatase 86 38 - 126 U/L   Total Bilirubin 0.5 0.0 - 1.2 mg/dL   GFR, Estimated >39 >39 mL/min   Anion gap 10 5 - 15  CBC with Differential (Cancer Center Only)  Result Value Ref Range   WBC Count 3.3 (L) 4.0 - 10.5 K/uL   RBC 4.61 4.22 - 5.81 MIL/uL   Hemoglobin 13.6 13.0 - 17.0 g/dL   HCT 59.8 60.9 - 47.9 %   MCV 87.0 80.0 - 100.0 fL   MCH 29.5 26.0 - 34.0 pg   MCHC 33.9 30.0 - 36.0 g/dL   RDW 86.2 88.4 - 84.4 %   Platelet Count 142 (L) 150 - 400 K/uL   nRBC 0.0 0.0 - 0.2 %   Neutrophils Relative % 52 %   Neutro Abs 1.7 1.7 - 7.7 K/uL   Lymphocytes Relative 33 %   Lymphs Abs 1.1 0.7 - 4.0 K/uL   Monocytes Relative 11 %   Monocytes Absolute 0.4 0.1 - 1.0 K/uL   Eosinophils Relative 3 %   Eosinophils Absolute 0.1 0.0 - 0.5 K/uL   Basophils Relative 1 %   Basophils Absolute 0.0 0.0 - 0.1 K/uL   Immature Granulocytes 0 %   Abs Immature Granulocytes 0.00 0.00 - 0.07 K/uL      RADIOGRAPHIC STUDIES:  I have personally reviewed the radiological images as listed and agree with the findings in the report.  IR IMAGING GUIDED PORT INSERTION CLINICAL DATA:  Adenocarcinoma of the cecum.  EXAM: Chest port catheter placement  TECHNIQUE: Procedure performed using fluoroscopy and ultrasound  CONTRAST:  None  RADIOPHARMACEUTICALS:  None  FLUOROSCOPY: 0.2 minutes 2.1 mGy  COMPARISON:  None  FINDINGS: The patient was placed in supine position on the IR  gantry and the right upper chest and neck were prepped and draped in the usual sterile fashion. The nurse administered intravenous fentanyl  and Versed  under my supervision and the nurse had no other injuries other than monitoring the patient and administering medications. I was present for the entire duration of procedure. 3 mg intravenous Versed , 100 mcg intravenous fentanyl , and 25 mg intravenous Benadryl were administered for a total sedation time of 16 minutes.  Ultrasound guidance was used to investigate the right internal jugular vein which was anechoic and compressible indicating patency. The needle was then advanced from a scan negative through the soft tissue into the right internal jugular vein under ultrasound guidance. A final image was obtained and stored in the patient's permanent medical record.  Access was then exchanged over a guidewire which was advanced under fluoroscopic guidance. The needle was removed and replaced with a micropuncture sheath. Approximately 2 inches below the clavicle the port pocket was created with a subsequent incision. The catheter was then tunneled from the port pocket to the venotomy site overlying the right internal jugular vein. Access was then exchanged over an 035 guidewire for peel-away sheath which was advanced over the guidewire under fluoroscopic guidance. The catheter was then advanced through the peel-away sheath to the sinoatrial junction. Sheath was removed. The catheter was then cut at the port pocket and connected to chest port. The chest port was tested for function and finally function well. The chest port was then flushed  with heparin  and a port pocket was closed with 4-0 suture. Final image was obtained demonstrating satisfactory position of chest port. The final count of all materials was satisfactory.  IMPRESSION: 1. Satisfactory placement of right internal jugular vein single-lumen chest port. Catheter tip is at the  cavoatrial junction.  2.  Okay to use and power inject chest port.  Electronically Signed   By: Cordella Banner   On: 05/16/2024 16:31    CODE STATUS:  Code Status History     Date Active Date Inactive Code Status Order ID Comments User Context   05/16/2024 1451 05/17/2024 0521 Full Code [id number]  Banner Cordella LABOR, MD HOV   04/12/2024 1523 04/13/2024 1633 Full Code [id number]  Debby Hila, MD Inpatient   09/29/2021 1048 09/29/2021 2301 Full Code [id number]  Waddell Danelle ORN, MD Inpatient   08/02/2021 1122 08/02/2021 2215 Full Code [id number]  Waddell Danelle ORN, MD Inpatient   09/30/2020 1930 10/01/2020 1719 Full Code [id number]  Cheryle Debby LABOR, MD Inpatient   04/03/2020 2150 04/08/2020 2149 Full Code [id number]  Shona Terry SAILOR, DO Inpatient    Questions for Most Recent Historical Code Status (Order [id number])     Question Answer   By: Consent: discussion documented in EHR            Orders Placed This Encounter  Procedures   CBC with Differential (Cancer Center Only)    Standing Status:   Future    Expected Date:   07/03/2024    Expiration Date:   07/03/2025   CMP (Cancer Center only)    Standing Status:   Future    Expected Date:   07/03/2024    Expiration Date:   07/03/2025   CBC with Differential (Cancer Center Only)    Standing Status:   Future    Expected Date:   07/17/2024    Expiration Date:   07/17/2025   CMP (Cancer Center only)    Standing Status:   Future    Expected Date:   07/17/2024    Expiration Date:   07/17/2025     Future Appointments  Date Time Provider Department Center  06/05/2024  9:45 AM [facility] FLUSH [facility] None  06/05/2024 10:30 AM [facility] INFUSION [facility] None  06/07/2024 12:30 PM [facility] FLUSH [facility] None  06/20/2024  7:45 AM [facility] FLUSH [facility] None  06/20/2024  8:30 AM [facility] INFUSION [facility] None  06/20/2024  8:30 AM Jenille Laszlo, MD [facility] None  06/22/2024 10:30 AM [facility] FLUSH  [facility] None     This document was completed utilizing speech recognition software. Grammatical errors, random word insertions, pronoun errors, and incomplete sentences are an occasional consequence of this system due to software limitations, ambient noise, and hardware issues. Any formal questions or concerns about the content, text or information contained within the body of this dictation should be directly addressed to the provider for clarification.

## 2024-05-30 NOTE — Assessment & Plan Note (Addendum)
 Please review oncology history for additional details and timeline of events.  Though the biopsy from colonoscopy in July 2025 did not show evidence of malignancy, given the size of the mass and clinical picture, Dr. Debby recommended proceeding with robotic assisted right colectomy.  After obtaining cardiac clearance, Dr. Debby performed robotic right colectomy on 04/12/2024.  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  Submucosal invasion was not identified.  Metastatic adenocarcinoma was noted in 2 out of 36 lymph nodes.  Margins were negative for carcinoma or dysplasia.  No LVI, PNI. pTis,pN1b,cM0, Stage III A disease.  MMR proficient.  Staging CT chest on 05/02/2024 showed no evidence of intrathoracic metastatic disease.  Previously CT abdomen and pelvis on 01/29/2024 showed no evidence of metastatic disease.  Previously I discussed staging, prognosis, plan of care, treatment options.  Reviewed NCCN guidelines.  Stage III colon cancer located in the cecum, status post resection with negative margins.   Chemotherapy is recommended to prevent recurrence due to the risk of microscopic disease. Two chemotherapy options were discussed: FOLFOX regimen with a pump every two weeks or CAPEOX regimen with oral pills every three weeks. The FOLFOX regimen is preferred initially, with the option to switch to CAPEOX if needed due to cardiac history. Common side effects of chemotherapy include nausea, vomiting, fatigue, low blood counts, tingling, numbness, cold sensitivity, and diarrhea. The goal of chemotherapy is to prevent recurrence and aim for a complete cure.  - We did proceed with guardant reveal test on 05/03/2024 and it showed no evidence of circulating tumor DNA.  We also checked Guardant 360 to look for any actionable mutations.  In case of PIK3CA mutation, we will start him on aspirin 100 mg daily.  Plan made to proceed with adjuvant systemic chemotherapy  with FOLFOX.  Started from 05/23/2024.  Tolerated cycle 1 of chemotherapy reasonably well without any major side effects.  Labs today reveal mild leukopenia with white count of 3300, normal differential.  Overall no dose-limiting toxicities.  - Schedule colonoscopy one year post-surgery.  - Monitor for cardiac arrhythmias due to chemotherapy.  - Advise on dietary modifications to avoid greasy and spicy foods.  - Limit alcohol intake to two beers, not more than 1 day a week, and avoid around chemotherapy days.  RTC in 1 week for labs and cycle 2 of chemotherapy.  RTC in 3 weeks for labs, office visit and cycle 3 of chemotherapy.

## 2024-05-31 ENCOUNTER — Other Ambulatory Visit: Payer: Self-pay

## 2024-06-05 ENCOUNTER — Inpatient Hospital Stay

## 2024-06-05 VITALS — BP 144/68 | HR 67 | Temp 98.8°F | Resp 16 | Wt 252.0 lb

## 2024-06-05 DIAGNOSIS — C180 Malignant neoplasm of cecum: Secondary | ICD-10-CM

## 2024-06-05 DIAGNOSIS — Z5111 Encounter for antineoplastic chemotherapy: Secondary | ICD-10-CM | POA: Diagnosis not present

## 2024-06-05 LAB — CBC WITH DIFFERENTIAL (CANCER CENTER ONLY)
Abs Immature Granulocytes: 0.02 K/uL (ref 0.00–0.07)
Basophils Absolute: 0.1 K/uL (ref 0.0–0.1)
Basophils Relative: 1 %
Eosinophils Absolute: 0.1 K/uL (ref 0.0–0.5)
Eosinophils Relative: 2 %
HCT: 41.2 % (ref 39.0–52.0)
Hemoglobin: 14.1 g/dL (ref 13.0–17.0)
Immature Granulocytes: 0 %
Lymphocytes Relative: 21 %
Lymphs Abs: 1 K/uL (ref 0.7–4.0)
MCH: 29.8 pg (ref 26.0–34.0)
MCHC: 34.2 g/dL (ref 30.0–36.0)
MCV: 87.1 fL (ref 80.0–100.0)
Monocytes Absolute: 0.4 K/uL (ref 0.1–1.0)
Monocytes Relative: 9 %
Neutro Abs: 3.1 K/uL (ref 1.7–7.7)
Neutrophils Relative %: 67 %
Platelet Count: 169 K/uL (ref 150–400)
RBC: 4.73 MIL/uL (ref 4.22–5.81)
RDW: 14.5 % (ref 11.5–15.5)
WBC Count: 4.6 K/uL (ref 4.0–10.5)
nRBC: 0 % (ref 0.0–0.2)

## 2024-06-05 LAB — CMP (CANCER CENTER ONLY)
ALT: 52 U/L — ABNORMAL HIGH (ref 0–44)
AST: 61 U/L — ABNORMAL HIGH (ref 15–41)
Albumin: 4.2 g/dL (ref 3.5–5.0)
Alkaline Phosphatase: 104 U/L (ref 38–126)
Anion gap: 11 (ref 5–15)
BUN: 8 mg/dL (ref 8–23)
CO2: 23 mmol/L (ref 22–32)
Calcium: 9.6 mg/dL (ref 8.9–10.3)
Chloride: 106 mmol/L (ref 98–111)
Creatinine: 0.84 mg/dL (ref 0.61–1.24)
GFR, Estimated: >60 mL/min (ref >60)
Glucose, Bld: 110 mg/dL — ABNORMAL HIGH (ref 70–99)
Potassium: 4.7 mmol/L (ref 3.5–5.1)
Sodium: 139 mmol/L (ref 135–145)
Total Bilirubin: 0.5 mg/dL (ref 0.0–1.2)
Total Protein: 7.5 g/dL (ref 6.5–8.1)

## 2024-06-05 MED ORDER — PALONOSETRON HCL INJECTION 0.25 MG/5ML
0.2500 mg | Freq: Once | INTRAVENOUS | Status: AC
  Administered 2024-06-05: 0.25 mg via INTRAVENOUS
  Filled 2024-06-05: qty 5

## 2024-06-05 MED ORDER — LEUCOVORIN CALCIUM INJECTION 350 MG
400.0000 mg/m2 | Freq: Once | INTRAVENOUS | Status: AC
  Administered 2024-06-05: 972 mg via INTRAVENOUS
  Filled 2024-06-05: qty 48.6

## 2024-06-05 MED ORDER — FLUOROURACIL CHEMO INJECTION 2.5 GM/50ML
400.0000 mg/m2 | Freq: Once | INTRAVENOUS | Status: AC
  Administered 2024-06-05: 1000 mg via INTRAVENOUS
  Filled 2024-06-05: qty 20

## 2024-06-05 MED ORDER — OXALIPLATIN CHEMO INJECTION 100 MG/20ML
85.0000 mg/m2 | Freq: Once | INTRAVENOUS | Status: AC
  Administered 2024-06-05: 200 mg via INTRAVENOUS
  Filled 2024-06-05: qty 4.98

## 2024-06-05 MED ORDER — DEXTROSE 5 % IV SOLN: INTRAVENOUS | Status: DC

## 2024-06-05 MED ORDER — SODIUM CHLORIDE 0.9 % IV SOLN
2400.0000 mg/m2 | INTRAVENOUS | Status: DC
  Administered 2024-06-05: 5850 mg via INTRAVENOUS
  Filled 2024-06-05: qty 117

## 2024-06-05 MED ORDER — DEXAMETHASONE SOD PHOSPHATE PF 10 MG/ML IJ SOLN
10.0000 mg | Freq: Once | INTRAMUSCULAR | Status: AC
  Administered 2024-06-05: 10 mg via INTRAVENOUS

## 2024-06-05 NOTE — Patient Instructions (Addendum)
 [facility] - A DEPT OF [facility]  Discharge Instructions: Thank you for choosing [facility] to provide your oncology and hematology care.   If you have a lab appointment with the Cancer Center, please go directly to the Cancer Center and check in at the registration area.   Wear comfortable clothing and clothing appropriate for easy access to any Portacath or PICC line.   We strive to give you quality time with your provider. You may need to reschedule your appointment if you arrive late (15 or more minutes).  Arriving late affects you and other patients whose appointments are after yours.  Also, if you miss three or more appointments without notifying the office, you may be dismissed from the clinic at the provider's discretion.      For prescription refill requests, have your pharmacy contact our office and allow 72 hours for refills to be completed.    Today you received the following chemotherapy and/or immunotherapy agents: Oxaliplatin /Leucovorin /Fluorouracil       To help prevent nausea and vomiting after your treatment, we encourage you to take your nausea medication as directed.  BELOW ARE SYMPTOMS THAT SHOULD BE REPORTED IMMEDIATELY: *FEVER GREATER THAN 100.4 F (38 C) OR HIGHER *CHILLS OR SWEATING *NAUSEA AND VOMITING THAT IS NOT CONTROLLED WITH YOUR NAUSEA MEDICATION *UNUSUAL SHORTNESS OF BREATH *UNUSUAL BRUISING OR BLEEDING *URINARY PROBLEMS (pain or burning when urinating, or frequent urination) *BOWEL PROBLEMS (unusual diarrhea, constipation, pain near the anus) TENDERNESS IN MOUTH AND THROAT WITH OR WITHOUT PRESENCE OF ULCERS (sore throat, sores in mouth, or a toothache) UNUSUAL RASH, SWELLING OR PAIN  UNUSUAL VAGINAL DISCHARGE OR ITCHING   Items with * indicate a potential emergency and should be followed up as soon as possible or go to the Emergency Department if any problems should occur.  Please show the CHEMOTHERAPY  ALERT CARD or IMMUNOTHERAPY ALERT CARD at check-in to the Emergency Department and triage nurse.  Should you have questions after your visit or need to cancel or reschedule your appointment, please contact [facility] - A DEPT OF [facility]  Dept: [phone]  and follow the prompts.  Office hours are 8:00 a.m. to 4:30 p.m. Monday - Friday. Please note that voicemails left after 4:00 p.m. may not be returned until the following business day.  We are closed weekends and major holidays. You have access to a nurse at all times for urgent questions. Please call the main number to the clinic Dept: [phone] and follow the prompts.   For any non-urgent questions, you may also contact your provider using MyChart. We now offer e-Visits for anyone 59 and older to request care online for non-urgent symptoms. For details visit [email or web address].   Also download the MyChart app! Go to the app store, search MyChart, open the app, select [place], and log in with your MyChart username and password.  The chemotherapy medication bag should finish at 46 hours, 96 hours, or 7 days. For example, if your pump is scheduled for 46 hours and it was put on at 4:00 p.m., it should finish at 2:00 p.m. the day it is scheduled to come off regardless of your appointment time.     Estimated time to finish at 1130 on 06/07/24.   If the display on your pump reads Low Volume and it is beeping, take the batteries out of the pump and come to the cancer center for  it to be taken off.   If the pump alarms go off prior to the pump reading Low Volume then call [phone] and someone can assist you.  If the plunger comes out and the chemotherapy medication is leaking out, please use your home chemo spill kit to clean up the spill. Do NOT use paper towels or other household products.  If you have problems or questions regarding your pump, please call either [phone] (24 hours  a day) or the cancer center Monday-Friday 8:00 a.m.- 4:30 p.m. at the clinic number and we will assist you. If you are unable to get assistance, then go to the nearest Emergency Department and ask the staff to contact the IV team for assistance.

## 2024-06-07 ENCOUNTER — Inpatient Hospital Stay

## 2024-06-20 ENCOUNTER — Inpatient Hospital Stay: Attending: Oncology | Admitting: Oncology

## 2024-06-20 ENCOUNTER — Inpatient Hospital Stay

## 2024-06-20 ENCOUNTER — Other Ambulatory Visit: Payer: Self-pay

## 2024-06-20 ENCOUNTER — Encounter: Payer: Self-pay | Admitting: Oncology

## 2024-06-20 VITALS — BP 135/68 | HR 65 | Temp 98.1°F | Resp 17 | Ht 72.0 in | Wt 249.0 lb

## 2024-06-20 DIAGNOSIS — D701 Agranulocytosis secondary to cancer chemotherapy: Secondary | ICD-10-CM | POA: Insufficient documentation

## 2024-06-20 DIAGNOSIS — C180 Malignant neoplasm of cecum: Secondary | ICD-10-CM | POA: Diagnosis present

## 2024-06-20 DIAGNOSIS — Z79899 Other long term (current) drug therapy: Secondary | ICD-10-CM | POA: Diagnosis not present

## 2024-06-20 DIAGNOSIS — C779 Secondary and unspecified malignant neoplasm of lymph node, unspecified: Secondary | ICD-10-CM | POA: Insufficient documentation

## 2024-06-20 DIAGNOSIS — T451X5A Adverse effect of antineoplastic and immunosuppressive drugs, initial encounter: Secondary | ICD-10-CM | POA: Diagnosis not present

## 2024-06-20 DIAGNOSIS — Z5111 Encounter for antineoplastic chemotherapy: Secondary | ICD-10-CM | POA: Insufficient documentation

## 2024-06-20 LAB — CBC WITH DIFFERENTIAL (CANCER CENTER ONLY)
Abs Immature Granulocytes: 0.01 K/uL (ref 0.00–0.07)
Basophils Absolute: 0.1 K/uL (ref 0.0–0.1)
Basophils Relative: 2 %
Eosinophils Absolute: 0.1 K/uL (ref 0.0–0.5)
Eosinophils Relative: 2 %
HCT: 41.8 % (ref 39.0–52.0)
Hemoglobin: 13.9 g/dL (ref 13.0–17.0)
Immature Granulocytes: 0 %
Lymphocytes Relative: 33 %
Lymphs Abs: 1.2 K/uL (ref 0.7–4.0)
MCH: 28.7 pg (ref 26.0–34.0)
MCHC: 33.3 g/dL (ref 30.0–36.0)
MCV: 86.4 fL (ref 80.0–100.0)
Monocytes Absolute: 0.5 K/uL (ref 0.1–1.0)
Monocytes Relative: 13 %
Neutro Abs: 1.7 K/uL (ref 1.7–7.7)
Neutrophils Relative %: 50 %
Platelet Count: 170 K/uL (ref 150–400)
RBC: 4.84 MIL/uL (ref 4.22–5.81)
RDW: 14.4 % (ref 11.5–15.5)
WBC Count: 3.5 K/uL — ABNORMAL LOW (ref 4.0–10.5)
nRBC: 0 % (ref 0.0–0.2)

## 2024-06-20 LAB — CMP (CANCER CENTER ONLY)
ALT: 44 U/L (ref 0–44)
AST: 42 U/L — ABNORMAL HIGH (ref 15–41)
Albumin: 3.9 g/dL (ref 3.5–5.0)
Alkaline Phosphatase: 114 U/L (ref 38–126)
Anion gap: 11 (ref 5–15)
BUN: 8 mg/dL (ref 8–23)
CO2: 26 mmol/L (ref 22–32)
Calcium: 9.3 mg/dL (ref 8.9–10.3)
Chloride: 102 mmol/L (ref 98–111)
Creatinine: 1.04 mg/dL (ref 0.61–1.24)
GFR, Estimated: >60 mL/min (ref >60)
Glucose, Bld: 137 mg/dL — ABNORMAL HIGH (ref 70–99)
Potassium: 4.1 mmol/L (ref 3.5–5.1)
Sodium: 139 mmol/L (ref 135–145)
Total Bilirubin: 0.7 mg/dL (ref 0.0–1.2)
Total Protein: 7.4 g/dL (ref 6.5–8.1)

## 2024-06-20 MED ORDER — SODIUM CHLORIDE 0.9 % IV SOLN
2400.0000 mg/m2 | INTRAVENOUS | Status: DC
  Administered 2024-06-20: 5850 mg via INTRAVENOUS
  Filled 2024-06-20: qty 117

## 2024-06-20 MED ORDER — DEXAMETHASONE SOD PHOSPHATE PF 10 MG/ML IJ SOLN
10.0000 mg | Freq: Once | INTRAMUSCULAR | Status: AC
  Administered 2024-06-20: 10 mg via INTRAVENOUS

## 2024-06-20 MED ORDER — FLUOROURACIL CHEMO INJECTION 2.5 GM/50ML
400.0000 mg/m2 | Freq: Once | INTRAVENOUS | Status: AC
  Administered 2024-06-20: 1000 mg via INTRAVENOUS
  Filled 2024-06-20: qty 20

## 2024-06-20 MED ORDER — OXALIPLATIN CHEMO INJECTION 100 MG/20ML
85.0000 mg/m2 | Freq: Once | INTRAVENOUS | Status: AC
  Administered 2024-06-20: 200 mg via INTRAVENOUS
  Filled 2024-06-20: qty 40

## 2024-06-20 MED ORDER — PALONOSETRON HCL INJECTION 0.25 MG/5ML
0.2500 mg | Freq: Once | INTRAVENOUS | Status: AC
  Administered 2024-06-20: 0.25 mg via INTRAVENOUS
  Filled 2024-06-20: qty 5

## 2024-06-20 MED ORDER — DEXTROSE 5 % IV SOLN: INTRAVENOUS | Status: DC

## 2024-06-20 MED ORDER — SODIUM CHLORIDE 0.9% FLUSH: 10.0000 mL | INTRAVENOUS | Status: DC | PRN

## 2024-06-20 MED ORDER — LEUCOVORIN CALCIUM INJECTION 350 MG
400.0000 mg/m2 | Freq: Once | INTRAVENOUS | Status: AC
  Administered 2024-06-20: 972 mg via INTRAVENOUS
  Filled 2024-06-20: qty 48.6

## 2024-06-20 NOTE — Progress Notes (Signed)
 [facility]  ONCOLOGY CLINIC PROGRESS NOTE   Patient Care Team: Barbra Odor, NP as PCP - General (Nurse Practitioner) Waddell Danelle ORN, MD as PCP - Electrophysiology (Cardiology) Raford Riggs, MD as PCP - Cardiology (Cardiology) Milissa Clack, MD as Referring Physician (Physical Medicine and Rehabilitation) Debby Hila, MD as Consulting Physician (General Surgery)  PATIENT NAME: Caleb [name]   MR#: [id number] DOB: 12-Oct-1960  Date of visit: 06/20/2024   ASSESSMENT & PLAN:   Caleb [name] is a 63 y.o.  gentleman with a past medical history of hypertension, diabetes mellitus type 2, dyslipidemia, atrial flutter status post ablation, was referred to our clinic for newly diagnosed adenocarcinoma of the cecum, stage III, MMR proficient, status post hemicolectomy on 04/12/2024.   Adenocarcinoma of cecum [facility]) Please review oncology history for additional details and timeline of events.  After obtaining cardiac clearance, Dr. Debby performed robotic right colectomy on 04/12/2024.  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  Submucosal invasion was not identified.  Metastatic adenocarcinoma was noted in 2 out of 36 lymph nodes.  Margins were negative for carcinoma or dysplasia.  No LVI, PNI. pTis,pN1b,cM0, Stage III A disease.  MMR proficient.  Staging CT chest on 05/02/2024 showed no evidence of intrathoracic metastatic disease.  Previously CT abdomen and pelvis on 01/29/2024 showed no evidence of metastatic disease.  Stage III colon cancer located in the cecum, status post resection with negative margins.   Chemotherapy is recommended to prevent recurrence due to the risk of microscopic disease. Two chemotherapy options were discussed: FOLFOX regimen with a pump every two weeks or CAPEOX regimen with oral pills every three weeks. The FOLFOX regimen is preferred initially, with the option to switch to CAPEOX if needed  due to cardiac history. Common side effects of chemotherapy include nausea, vomiting, fatigue, low blood counts, tingling, numbness, cold sensitivity, and diarrhea. The goal of chemotherapy is to prevent recurrence and aim for a complete cure.  - We did proceed with guardant reveal test on 05/03/2024 and it showed no evidence of circulating tumor DNA.  We also checked Guardant 360 to look for any actionable mutations.  In case of PIK3CA mutation, we will start him on aspirin 100 mg daily.  Plan made to proceed with adjuvant systemic chemotherapy with FOLFOX.  Started from 05/23/2024.  He has been tolerating chemotherapy reasonably well except for mild, self-limited nausea, controlled heartburn, and transient cold sensitivity and mild leukopenia.   Due for cycle 3 of chemotherapy today.  Labs reveal white count of 3500, ANC 1700.  No dose-limiting toxicities.  Will proceed with cycle 3 as scheduled.  Will add Neulasta on the day of 5-FU pump removal with current cycle and as needed going forward.  - Schedule colonoscopy one year post-surgery.  - Monitor for cardiac arrhythmias due to chemotherapy.  - Advise on dietary modifications to avoid greasy and spicy foods.  - Limit alcohol intake to two beers, not more than 1 day a week, and avoid around chemotherapy days.  RTC in 2 weeks for labs, office visit and cycle 4 of chemotherapy.  Dates will be adjusted to accommodate Christmas holiday.   Chemotherapy-induced leukopenia White blood cell count decreased to 3,500, consistent with chemotherapy-induced leukopenia. Previous count was 4,600.  ANC 1700.  Other blood counts are normal. - Will proceed with Neulasta on the day of 5-FU pump removal with current cycle and also with future cycles as needed - Continue to monitor white blood  cell count closely  Chemotherapy-induced peripheral neuropathy Developed cold sensitivity in fingers and mouth, a common side effect of chemotherapy. Symptoms may  persist during chemotherapy breaks but are expected to resolve post-treatment. Tingling and numbness may limit chemotherapy dosage. - Continue to monitor neuropathy symptoms and adjust chemotherapy dosage if necessary  Gastroesophageal reflux disease Symptoms improved with pantoprazole .  - Continue pantoprazole   Atrial flutter, status post ablation, on flecainide  Atrial flutter, status post two ablations with the second being successful. Currently on flecainide . Chemotherapy may increase the risk of cardiac arrhythmias, particularly with the FOLFOX regimen. - Monitor cardiac status closely during chemotherapy. - Consider switching to CAPOX regimen if cardiac issues arise.    I reviewed lab results and outside records for this visit and discussed relevant results with the patient. Diagnosis, plan of care and treatment options were also discussed in detail with the patient. Opportunity provided to ask questions and answers provided to his apparent satisfaction. Provided instructions to call our clinic with any problems, questions or concerns prior to return visit. I recommended to continue follow-up with PCP and sub-specialists. He verbalized understanding and agreed with the plan.   NCCN guidelines have been consulted in the planning of this patients care.  I spent a total of 42 minutes during this encounter with the patient including review of chart and various tests results, discussions about plan of care and coordination of care plan.   [name], MD  06/20/2024  [facility] - [facility] [address] [place] [address] Dept: [phone] Dept Fax: [phone]    CHIEF COMPLAINT/ REASON FOR VISIT:   Adenocarcinoma of the cecum, stage III, MMR proficient, status post hemicolectomy on 04/12/2024.   Current Treatment: Started adjuvant systemic chemotherapy with FOLFOX from 05/23/2024.  INTERVAL  HISTORY:    Discussed the use of AI scribe software for clinical note transcription with the patient, who gave verbal consent to proceed.  History of Present Illness  Caleb [name] is a 63 year old male with cecal adenocarcinoma, status post resection, currently receiving adjuvant chemotherapy, who presents for follow-up and administration of his third cycle.  He has completed two cycles of adjuvant chemotherapy and is scheduled for his third cycle today. He has experienced mild, self-limited nocturnal nausea that resolved spontaneously without antiemetic use, and transient heartburn controlled with prescribed medication. He denies vomiting, diarrhea, dermatologic changes, or oral mucositis. He describes transient cold sensitivity following chemotherapy, which resolved one to two days prior to this visit and recurs with each cycle.  He maintains adequate oral intake without difficulty eating. He denies new symptoms or concerns, including chest pain, dyspnea, palpitations, abnormal bleeding, or bruising. Recent laboratory evaluation shows an absolute neutrophil count of 1,700. Red blood cell and platelet counts remain within normal limits.   I have reviewed the past medical history, past surgical history, social history and family history with the patient and they are unchanged from previous note.  HISTORY OF PRESENT ILLNESS:   ONCOLOGY HISTORY:   Patient had a screening colonoscopy when he was 63 years old which showed some benign polyps.  He did not have a follow-up colonoscopy after that.   He had mild anemia in June 2025 with hemoglobin of 12.1, MCV 76.  His PCP obtained additional workup including Cologuard test.  Cologuard came back positive.   He had a colonoscopy on 01/22/2024, performed by Dr. Abran.  It showed 12 mm apparently did  polyp in the rectum, 4 polyps in the descending colon and transverse colon measuring 3 to 8 mm in size.  In the cecum there was a 4 to 5 cm soft polypoid  mass which was friable.  This was biopsied with cold forceps.  In addition there were 4 PET included in semipedunculated polyps measuring between 10 and 25 mm.  This area was tattooed.  Multiple diverticula were noted in the colon.  Internal hemorrhoids.  He had an EGD that day which showed evidence of esophagitis and stricture.  Hiatal hernia was noted.  Question portal hypertensive gastropathy.   Pathology showed superficial fragments of tubulovillous adenoma from the cecal mass.  It was negative for high-grade dysplasia or carcinoma.  Rest of the polyps were either hyperplastic or tubular adenomas without evidence of malignancy or high-grade dysplasia.   On 01/29/2024, CT abdomen and pelvis was obtained for further evaluation which showed what appeared to be 4.3 cm intraluminal soft tissue density in the ascending colon, highly suspicious for polypoid intraluminal mass.  No evidence of metastatic disease.  Colonic diverticulosis was noted.  Probable hepatic cirrhosis was also noted.   With these findings, patient was referred to Dr. Bernarda Ned, general surgery for further evaluation.  Though the biopsy did not show evidence of malignancy, given the size of the mass and clinical picture, Dr. Ned recommended proceeding with robotic assisted right colectomy.   After obtaining cardiac clearance, Dr. Ned performed robotic right colectomy on 04/12/2024.  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  Submucosal invasion was not identified.  Metastatic adenocarcinoma was noted in 2 out of 36 lymph nodes.  Margins were negative for carcinoma or dysplasia.  No LVI, PNI. pTis,pN1b,cM0, Stage III A disease.  MMR proficient.   Staging CT chest on 05/02/2024 showed no evidence of intrathoracic metastatic disease.   Given lymph node positive adenocarcinoma of the cecum, recommended adjuvant chemotherapy with FOLFOX regimen.  We will watch him closely for any development of  cardiac arrhythmias.  Started cycle 1 of FOLFOX from 05/23/2024.  Oncology History  Adenocarcinoma of cecum (HCC)  05/03/2024 Initial Diagnosis   Adenocarcinoma of cecum (HCC)   05/03/2024 Cancer Staging   Staging form: Colon and Rectum, AJCC 8th Edition - Clinical: Stage IIIA (cT1, cN1b, cM0) - Signed by Autumn Millman, MD on 05/03/2024 Histologic grade (G): G1 Histologic grading system: 4 grade system   05/23/2024 -  Chemotherapy   Patient is on Treatment Plan : COLORECTAL FOLFOX q14d x 3 months         REVIEW OF SYSTEMS:   Review of Systems - Oncology  All other pertinent systems were reviewed with the patient and are negative.  ALLERGIES: He has no known allergies.  MEDICATIONS:  Current Outpatient Medications  Medication Sig Dispense Refill   atorvastatin  (LIPITOR) 20 MG tablet Take 20 mg by mouth daily.     Blood Glucose Monitoring Suppl (TRUE METRIX AIR GLUCOSE METER) DEVI Check CBG BID 1 each 11   colchicine  0.6 MG tablet Take 0.6 mg by mouth as needed (gout).     dexamethasone  (DECADRON ) 4 MG tablet Take 2 tablets (8 mg total) by mouth daily. Start the day after chemotherapy for 2 days. Take with food. (Patient not taking: Reported on 05/30/2024) 30 tablet 1   flecainide  (TAMBOCOR ) 100 MG tablet TAKE 1 TABLET BY MOUTH EVERY 12 HOURS 30 tablet 0   lidocaine -prilocaine  (EMLA ) cream Apply to affected area once 30 g 3   metFORMIN  (  GLUCOPHAGE ) 500 MG tablet Take 500-1,000 mg by mouth See admin instructions. Take 1000 mg in the morning and 500 mg in the evening     metoprolol  succinate (TOPROL -XL) 50 MG 24 hr tablet Take 1 tablet (50 mg total) by mouth 2 (two) times daily. 30 tablet 0   ondansetron  (ZOFRAN ) 8 MG tablet Take 1 tablet (8 mg total) by mouth every 8 (eight) hours as needed for nausea or vomiting. Start on the third day after chemotherapy. 30 tablet 1   pantoprazole  (PROTONIX ) 40 MG tablet Take 1 tablet (40 mg total) by mouth daily. 30 tablet 5    prochlorperazine  (COMPAZINE ) 10 MG tablet Take 1 tablet (10 mg total) by mouth every 6 (six) hours as needed for nausea or vomiting. (Patient not taking: Reported on 05/30/2024) 30 tablet 1   tadalafil (CIALIS) 20 MG tablet Take 20 mg by mouth daily as needed for erectile dysfunction. (Patient not taking: Reported on 05/30/2024)     traMADol  (ULTRAM ) 50 MG tablet Take 1 tablet (50 mg total) by mouth every 6 (six) hours as needed for moderate pain (pain score 4-6) or severe pain (pain score 7-10). 30 tablet 0   No current facility-administered medications for this visit.   Facility-Administered Medications Ordered in Other Visits  Medication Dose Route Frequency Provider Last Rate Last Admin   dextrose  5 % solution   Intravenous Continuous Shravya Wickwire, MD 10 mL/hr at 06/20/24 0903 New Bag at 06/20/24 0903   fluorouracil  (ADRUCIL ) 5,850 mg in sodium chloride  0.9 % 133 mL chemo infusion  2,400 mg/m2 (Treatment Plan Recorded) Intravenous 1 day or 1 dose Kayly Kriegel, MD       fluorouracil  (ADRUCIL ) chemo injection 1,000 mg  400 mg/m2 (Treatment Plan Recorded) Intravenous Once Charnese Federici, MD       leucovorin  972 mg in dextrose  5 % 250 mL infusion  400 mg/m2 (Treatment Plan Recorded) Intravenous Once Grisell Bissette, MD 149 mL/hr at 06/20/24 1026 972 mg at 06/20/24 1026   oxaliplatin  (ELOXATIN ) 200 mg in dextrose  5 % 500 mL chemo infusion  85 mg/m2 (Treatment Plan Recorded) Intravenous Once Jisela Merlino, MD 270 mL/hr at 06/20/24 1030 200 mg at 06/20/24 1030   sodium chloride  flush (NS) 0.9 % injection 10 mL  10 mL Intracatheter PRN Abrea Henle, MD         VITALS:   There were no vitals taken for this visit.  Wt Readings from Last 3 Encounters:  06/20/24 249 lb (112.9 kg)  06/05/24 252 lb (114.3 kg)  05/30/24 254 lb (115.2 kg)    There is no height or weight on file to calculate BMI.   Onc Performance Status - 06/20/24 0900       ECOG Perf Status   ECOG Perf Status Fully active,  able to carry on all pre-disease performance without restriction      KPS SCALE   KPS % SCORE Normal, no compliants, no evidence of disease           PHYSICAL EXAM:   Physical Exam Constitutional:      General: He is not in acute distress.    Appearance: Normal appearance.  HENT:     Head: Normocephalic and atraumatic.  Eyes:     Conjunctiva/sclera: Conjunctivae normal.  Cardiovascular:     Rate and Rhythm: Normal rate and regular rhythm.  Pulmonary:     Effort: Pulmonary effort is normal. No respiratory distress.  Chest:     Comments: Port-A-Cath in place without  any signs of infection Abdominal:     General: There is no distension.     Comments: Scars from recent surgery have healed well  Neurological:     General: No focal deficit present.     Mental Status: He is alert and oriented to person, place, and time.  Psychiatric:        Mood and Affect: Mood normal.        Behavior: Behavior normal.      LABORATORY DATA:   I have reviewed the data as listed.  Results for orders placed or performed in visit on 06/20/24  CMP (Cancer Center only)  Result Value Ref Range   Sodium 139 135 - 145 mmol/L   Potassium 4.1 3.5 - 5.1 mmol/L   Chloride 102 98 - 111 mmol/L   CO2 26 22 - 32 mmol/L   Glucose, Bld 137 (H) 70 - 99 mg/dL   BUN 8 8 - 23 mg/dL   Creatinine 8.95 9.38 - 1.24 mg/dL   Calcium  9.3 8.9 - 10.3 mg/dL   Total Protein 7.4 6.5 - 8.1 g/dL   Albumin  3.9 3.5 - 5.0 g/dL   AST 42 (H) 15 - 41 U/L   ALT 44 0 - 44 U/L   Alkaline Phosphatase 114 38 - 126 U/L   Total Bilirubin 0.7 0.0 - 1.2 mg/dL   GFR, Estimated >39 >39 mL/min   Anion gap 11 5 - 15  CBC with Differential (Cancer Center Only)  Result Value Ref Range   WBC Count 3.5 (L) 4.0 - 10.5 K/uL   RBC 4.84 4.22 - 5.81 MIL/uL   Hemoglobin 13.9 13.0 - 17.0 g/dL   HCT 58.1 60.9 - 47.9 %   MCV 86.4 80.0 - 100.0 fL   MCH 28.7 26.0 - 34.0 pg   MCHC 33.3 30.0 - 36.0 g/dL   RDW 85.5 88.4 - 84.4 %   Platelet  Count 170 150 - 400 K/uL   nRBC 0.0 0.0 - 0.2 %   Neutrophils Relative % 50 %   Neutro Abs 1.7 1.7 - 7.7 K/uL   Lymphocytes Relative 33 %   Lymphs Abs 1.2 0.7 - 4.0 K/uL   Monocytes Relative 13 %   Monocytes Absolute 0.5 0.1 - 1.0 K/uL   Eosinophils Relative 2 %   Eosinophils Absolute 0.1 0.0 - 0.5 K/uL   Basophils Relative 2 %   Basophils Absolute 0.1 0.0 - 0.1 K/uL   Immature Granulocytes 0 %   Abs Immature Granulocytes 0.01 0.00 - 0.07 K/uL      RADIOGRAPHIC STUDIES:  I have personally reviewed the radiological images as listed and agree with the findings in the report.  IR IMAGING GUIDED PORT INSERTION CLINICAL DATA:  Adenocarcinoma of the cecum.  EXAM: Chest port catheter placement  TECHNIQUE: Procedure performed using fluoroscopy and ultrasound  CONTRAST:  None  RADIOPHARMACEUTICALS:  None  FLUOROSCOPY: 0.2 minutes 2.1 mGy  COMPARISON:  None  FINDINGS: The patient was placed in supine position on the IR gantry and the right upper chest and neck were prepped and draped in the usual sterile fashion. The nurse administered intravenous fentanyl  and Versed  under my supervision and the nurse had no other injuries other than monitoring the patient and administering medications. I was present for the entire duration of procedure. 3 mg intravenous Versed , 100 mcg intravenous fentanyl , and 25 mg intravenous Benadryl  were administered for a total sedation time of 16 minutes.  Ultrasound guidance was used to investigate the right  internal jugular vein which was anechoic and compressible indicating patency. The needle was then advanced from a scan negative through the soft tissue into the right internal jugular vein under ultrasound guidance. A final image was obtained and stored in the patient's permanent medical record.  Access was then exchanged over a guidewire which was advanced under fluoroscopic guidance. The needle was removed and replaced with  a micropuncture sheath. Approximately 2 inches below the clavicle the port pocket was created with a subsequent incision. The catheter was then tunneled from the port pocket to the venotomy site overlying the right internal jugular vein. Access was then exchanged over an 035 guidewire for peel-away sheath which was advanced over the guidewire under fluoroscopic guidance. The catheter was then advanced through the peel-away sheath to the sinoatrial junction. Sheath was removed. The catheter was then cut at the port pocket and connected to chest port. The chest port was tested for function and finally function well. The chest port was then flushed with heparin  and a port pocket was closed with 4-0 suture. Final image was obtained demonstrating satisfactory position of chest port. The final count of all materials was satisfactory.  IMPRESSION: 1. Satisfactory placement of right internal jugular vein single-lumen chest port. Catheter tip is at the cavoatrial junction.  2.  Okay to use and power inject chest port.  Electronically Signed   By: Cordella Banner   On: 05/16/2024 16:31    CODE STATUS:  Code Status History     Date Active Date Inactive Code Status Order ID Comments User Context   05/16/2024 1451 05/17/2024 0521 Full Code [id number]  Banner Cordella LABOR, MD HOV   04/12/2024 1523 04/13/2024 1633 Full Code [id number]  Debby Hila, MD Inpatient   09/29/2021 1048 09/29/2021 2301 Full Code [id number]  Waddell Danelle ORN, MD Inpatient   08/02/2021 1122 08/02/2021 2215 Full Code [id number]  Waddell Danelle ORN, MD Inpatient   09/30/2020 1930 10/01/2020 1719 Full Code [id number]  Cheryle Debby LABOR, MD Inpatient   04/03/2020 2150 04/08/2020 2149 Full Code [id number]  Shona Terry SAILOR, DO Inpatient    Questions for Most Recent Historical Code Status (Order [id number])     Question Answer   By: Consent: discussion documented in EHR            Orders Placed This Encounter  Procedures   CBC with  Differential (Cancer Center Only)    Standing Status:   Future    Expected Date:   07/31/2024    Expiration Date:   07/31/2025   CMP (Cancer Center only)    Standing Status:   Future    Expected Date:   07/31/2024    Expiration Date:   07/31/2025     Future Appointments  Date Time Provider Department Center  06/22/2024 10:30 AM [facility] None     This document was completed utilizing speech recognition software. Grammatical errors, random word insertions, pronoun errors, and incomplete sentences are an occasional consequence of this system due to software limitations, ambient noise, and hardware issues. Any formal questions or concerns about the content, text or information contained within the body of this dictation should be directly addressed to the provider for clarification.

## 2024-06-20 NOTE — Patient Instructions (Addendum)
 [facility] - A DEPT OF [place]  Discharge Instructions: Thank you for choosing [facility] to provide your oncology and hematology care.   If you have a lab appointment with the Cancer Center, please go directly to the Cancer Center and check in at the registration area.   Wear comfortable clothing and clothing appropriate for easy access to any Portacath or PICC line.   We strive to give you quality time with your provider. You may need to reschedule your appointment if you arrive late (15 or more minutes).  Arriving late affects you and other patients whose appointments are after yours.  Also, if you miss three or more appointments without notifying the office, you may be dismissed from the clinic at the providers discretion.      For prescription refill requests, have your pharmacy contact our office and allow 72 hours for refills to be completed.    Today you received the following chemotherapy and/or immunotherapy agents: Oxaliplatin , Leucovorin , Fluorouracil        To help prevent nausea and vomiting after your treatment, we encourage you to take your nausea medication as directed.  BELOW ARE SYMPTOMS THAT SHOULD BE REPORTED IMMEDIATELY: *FEVER GREATER THAN 100.4 F (38 C) OR HIGHER *CHILLS OR SWEATING *NAUSEA AND VOMITING THAT IS NOT CONTROLLED WITH YOUR NAUSEA MEDICATION *UNUSUAL SHORTNESS OF BREATH *UNUSUAL BRUISING OR BLEEDING *URINARY PROBLEMS (pain or burning when urinating, or frequent urination) *BOWEL PROBLEMS (unusual diarrhea, constipation, pain near the anus) TENDERNESS IN MOUTH AND THROAT WITH OR WITHOUT PRESENCE OF ULCERS (sore throat, sores in mouth, or a toothache) UNUSUAL RASH, SWELLING OR PAIN  UNUSUAL VAGINAL DISCHARGE OR ITCHING   Items with * indicate a potential emergency and should be followed up as soon as possible or go to the Emergency Department if any problems should occur.  Please show the CHEMOTHERAPY  ALERT CARD or IMMUNOTHERAPY ALERT CARD at check-in to the Emergency Department and triage nurse.  Should you have questions after your visit or need to cancel or reschedule your appointment, please contact [facility]  Dept: [phone]  and follow the prompts.  Office hours are 8:00 a.m. to 4:30 p.m. Monday - Friday. Please note that voicemails left after 4:00 p.m. may not be returned until the following business day.  We are closed weekends and major holidays. You have access to a nurse at all times for urgent questions. Please call the main number to the clinic Dept: [phone] and follow the prompts.   For any non-urgent questions, you may also contact your provider using MyChart. We now offer e-Visits for anyone 61 and older to request care online for non-urgent symptoms. For details visit [email or web address].   Also download the MyChart app! Go to the app store, search MyChart, open the app, select [place], and log in with your MyChart username and password.  You will be receiving this injection at your next appointment with your pump stop:  Pegfilgrastim Injection What is this medication? PEGFILGRASTIM (PEG fil gra stim) lowers the risk of infection in people who are receiving chemotherapy. It works by systems analyst make more white blood cells, which protects your body from infection. It may also be used to help people who have been exposed to high doses of radiation. This medicine may be used for other purposes; ask your health care provider or pharmacist if you have  questions. COMMON BRAND NAME(S): Fulphila, Fylnetra, Neulasta, Nyvepria, Stimufend, UDENYCA, UDENYCA ONBODY, Ziextenzo What should I tell my care team before I take this medication? They need to know if you have any of these conditions: Kidney disease Latex allergy Ongoing radiation therapy Sickle cell disease Skin reactions to acrylic adhesives  (On-Body Injector only) An unusual or allergic reaction to pegfilgrastim, filgrastim, other medications, foods, dyes, or preservatives Pregnant or trying to get pregnant Breast-feeding How should I use this medication? This medication is for injection under the skin. If you get this medication at home, you will be taught how to prepare and give the pre-filled syringe or how to use the On-body Injector. Refer to the patient Instructions for Use for detailed instructions. Use exactly as directed. Tell your care team immediately if you suspect that the On-body Injector may not have performed as intended or if you suspect the use of the On-body Injector resulted in a missed or partial dose. It is important that you put your used needles and syringes in a special sharps container. Do not put them in a trash can. If you do not have a sharps container, call your pharmacist or care team to get one. Talk to your care team about the use of this medication in children. While this medication may be prescribed for selected conditions, precautions do apply. Overdosage: If you think you have taken too much of this medicine contact a poison control center or emergency room at once. NOTE: This medicine is only for you. Do not share this medicine with others. What if I miss a dose? It is important not to miss your dose. Call your care team if you miss your dose. If you miss a dose due to an On-body Injector failure or leakage, a new dose should be administered as soon as possible using a single prefilled syringe for manual use. What may interact with this medication? Interactions have not been studied. This list may not describe all possible interactions. Give your health care provider a list of all the medicines, herbs, non-prescription drugs, or dietary supplements you use. Also tell them if you smoke, drink alcohol, or use illegal drugs. Some items may interact with your medicine. What should I watch for while using  this medication? Your condition will be monitored carefully while you are receiving this medication. You may need blood work done while you are taking this medication. Talk to your care team about your risk of cancer. You may be more at risk for certain types of cancer if you take this medication. If you are going to need a MRI, CT scan, or other procedure, tell your care team that you are using this medication (On-Body Injector only). What side effects may I notice from receiving this medication? Side effects that you should report to your care team as soon as possible: Allergic reactions--skin rash, itching, hives, swelling of the face, lips, tongue, or throat Capillary leak syndrome--stomach or muscle pain, unusual weakness or fatigue, feeling faint or lightheaded, decrease in the amount of urine, swelling of the ankles, hands, or feet, trouble breathing High white blood cell level--fever, fatigue, trouble breathing, night sweats, change in vision, weight loss Inflammation of the aorta--fever, fatigue, back, chest, or stomach pain, severe headache Kidney injury (glomerulonephritis)--decrease in the amount of urine, red or dark brown urine, foamy or bubbly urine, swelling of the ankles, hands, or feet Shortness of breath or trouble breathing Spleen injury--pain in upper left stomach or shoulder Unusual bruising or bleeding Side  effects that usually do not require medical attention (report to your care team if they continue or are bothersome): Bone pain- use the claritin the day before and day of the injection to attempt to mitigate this side effect. Pain in the hands or feet This list may not describe all possible side effects. Call your doctor for medical advice about side effects. You may report side effects to FDA at 1-800-FDA-1088. Where should I keep my medication? Keep out of the reach of children. If you are using this medication at home, you will be instructed on how to store it. Throw  away any unused medication after the expiration date on the label. NOTE: This sheet is a summary. It may not cover all possible information. If you have questions about this medicine, talk to your doctor, pharmacist, or health care provider.  2024 Elsevier/Gold Standard (2021-05-28 00:00:00)

## 2024-06-20 NOTE — Assessment & Plan Note (Addendum)
 Please review oncology history for additional details and timeline of events.  After obtaining cardiac clearance, Dr. Debby performed robotic right colectomy on 04/12/2024.  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  Submucosal invasion was not identified.  Metastatic adenocarcinoma was noted in 2 out of 36 lymph nodes.  Margins were negative for carcinoma or dysplasia.  No LVI, PNI. pTis,pN1b,cM0, Stage III A disease.  MMR proficient.  Staging CT chest on 05/02/2024 showed no evidence of intrathoracic metastatic disease.  Previously CT abdomen and pelvis on 01/29/2024 showed no evidence of metastatic disease.  Stage III colon cancer located in the cecum, status post resection with negative margins.   Chemotherapy is recommended to prevent recurrence due to the risk of microscopic disease. Two chemotherapy options were discussed: FOLFOX regimen with a pump every two weeks or CAPEOX regimen with oral pills every three weeks. The FOLFOX regimen is preferred initially, with the option to switch to CAPEOX if needed due to cardiac history. Common side effects of chemotherapy include nausea, vomiting, fatigue, low blood counts, tingling, numbness, cold sensitivity, and diarrhea. The goal of chemotherapy is to prevent recurrence and aim for a complete cure.  - We did proceed with guardant reveal test on 05/03/2024 and it showed no evidence of circulating tumor DNA.  We also checked Guardant 360 to look for any actionable mutations.  In case of PIK3CA mutation, we will start him on aspirin 100 mg daily.  Plan made to proceed with adjuvant systemic chemotherapy with FOLFOX.  Started from 05/23/2024.  He has been tolerating chemotherapy reasonably well except for mild, self-limited nausea, controlled heartburn, and transient cold sensitivity and mild leukopenia.   Due for cycle 3 of chemotherapy today.  Labs reveal white count of 3500, ANC 1700.  No dose-limiting  toxicities.  Will proceed with cycle 3 as scheduled.  Will add Neulasta on the day of 5-FU pump removal with current cycle and as needed going forward.  - Schedule colonoscopy one year post-surgery.  - Monitor for cardiac arrhythmias due to chemotherapy.  - Advise on dietary modifications to avoid greasy and spicy foods.  - Limit alcohol intake to two beers, not more than 1 day a week, and avoid around chemotherapy days.  RTC in 2 weeks for labs, office visit and cycle 4 of chemotherapy.  Dates will be adjusted to accommodate Christmas holiday.

## 2024-06-21 ENCOUNTER — Other Ambulatory Visit: Payer: Self-pay

## 2024-06-22 ENCOUNTER — Inpatient Hospital Stay

## 2024-06-22 VITALS — BP 140/74 | HR 91 | Temp 97.2°F | Resp 20

## 2024-06-22 DIAGNOSIS — C180 Malignant neoplasm of cecum: Secondary | ICD-10-CM

## 2024-06-22 DIAGNOSIS — Z5111 Encounter for antineoplastic chemotherapy: Secondary | ICD-10-CM | POA: Diagnosis not present

## 2024-06-22 MED ORDER — SODIUM CHLORIDE 0.9% FLUSH: 10.0000 mL | INTRAVENOUS | Status: DC | PRN

## 2024-06-22 MED ORDER — PEGFILGRASTIM INJECTION 6 MG/0.6ML ~~LOC~~
6.0000 mg | PREFILLED_SYRINGE | Freq: Once | SUBCUTANEOUS | Status: AC
  Administered 2024-06-22: 6 mg via SUBCUTANEOUS
  Filled 2024-06-22: qty 0.6

## 2024-06-22 NOTE — Patient Instructions (Signed)

## 2024-07-03 ENCOUNTER — Inpatient Hospital Stay: Admitting: Oncology

## 2024-07-03 ENCOUNTER — Encounter: Payer: Self-pay | Admitting: Oncology

## 2024-07-03 ENCOUNTER — Inpatient Hospital Stay

## 2024-07-03 ENCOUNTER — Other Ambulatory Visit: Payer: Self-pay

## 2024-07-03 VITALS — BP 152/94 | HR 96 | Temp 98.0°F | Resp 18

## 2024-07-03 DIAGNOSIS — C180 Malignant neoplasm of cecum: Secondary | ICD-10-CM

## 2024-07-03 DIAGNOSIS — Z5111 Encounter for antineoplastic chemotherapy: Secondary | ICD-10-CM | POA: Diagnosis not present

## 2024-07-03 LAB — CMP (CANCER CENTER ONLY)
ALT: 58 U/L — ABNORMAL HIGH (ref 0–44)
AST: 82 U/L — ABNORMAL HIGH (ref 15–41)
Albumin: 4.2 g/dL (ref 3.5–5.0)
Alkaline Phosphatase: 136 U/L — ABNORMAL HIGH (ref 38–126)
Anion gap: 12 (ref 5–15)
BUN: 7 mg/dL — ABNORMAL LOW (ref 8–23)
CO2: 25 mmol/L (ref 22–32)
Calcium: 9.4 mg/dL (ref 8.9–10.3)
Chloride: 102 mmol/L (ref 98–111)
Creatinine: 1.04 mg/dL (ref 0.61–1.24)
GFR, Estimated: >60 mL/min
Glucose, Bld: 150 mg/dL — ABNORMAL HIGH (ref 70–99)
Potassium: 3.6 mmol/L (ref 3.5–5.1)
Sodium: 138 mmol/L (ref 135–145)
Total Bilirubin: 0.6 mg/dL (ref 0.0–1.2)
Total Protein: 7.9 g/dL (ref 6.5–8.1)

## 2024-07-03 LAB — CBC WITH DIFFERENTIAL (CANCER CENTER ONLY)
Abs Immature Granulocytes: 0.03 K/uL (ref 0.00–0.07)
Basophils Absolute: 0.1 K/uL (ref 0.0–0.1)
Basophils Relative: 1 %
Eosinophils Absolute: 0.1 K/uL (ref 0.0–0.5)
Eosinophils Relative: 2 %
HCT: 41.9 % (ref 39.0–52.0)
Hemoglobin: 14.3 g/dL (ref 13.0–17.0)
Immature Granulocytes: 1 %
Lymphocytes Relative: 22 %
Lymphs Abs: 1.1 K/uL (ref 0.7–4.0)
MCH: 29 pg (ref 26.0–34.0)
MCHC: 34.1 g/dL (ref 30.0–36.0)
MCV: 85 fL (ref 80.0–100.0)
Monocytes Absolute: 0.4 K/uL (ref 0.1–1.0)
Monocytes Relative: 8 %
Neutro Abs: 3.5 K/uL (ref 1.7–7.7)
Neutrophils Relative %: 66 %
Platelet Count: 136 K/uL — ABNORMAL LOW (ref 150–400)
RBC: 4.93 MIL/uL (ref 4.22–5.81)
RDW: 14.6 % (ref 11.5–15.5)
WBC Count: 5.2 K/uL (ref 4.0–10.5)
nRBC: 0 % (ref 0.0–0.2)

## 2024-07-03 MED ORDER — MEGESTROL ACETATE 625 MG/5ML PO SUSP: 625.0000 mg | Freq: Every day | ORAL | 3 refills | Status: DC

## 2024-07-03 MED ORDER — OXALIPLATIN CHEMO INJECTION 100 MG/20ML
85.0000 mg/m2 | Freq: Once | INTRAVENOUS | Status: AC
  Administered 2024-07-03: 200 mg via INTRAVENOUS
  Filled 2024-07-03: qty 40

## 2024-07-03 MED ORDER — PALONOSETRON HCL INJECTION 0.25 MG/5ML
0.2500 mg | Freq: Once | INTRAVENOUS | Status: AC
  Administered 2024-07-03: 0.25 mg via INTRAVENOUS
  Filled 2024-07-03: qty 5

## 2024-07-03 MED ORDER — SODIUM CHLORIDE 0.9% FLUSH: 10.0000 mL | INTRAVENOUS | Status: DC | PRN

## 2024-07-03 MED ORDER — LEUCOVORIN CALCIUM INJECTION 350 MG
400.0000 mg/m2 | Freq: Once | INTRAVENOUS | Status: AC
  Administered 2024-07-03: 972 mg via INTRAVENOUS
  Filled 2024-07-03: qty 48.6

## 2024-07-03 MED ORDER — FLUOROURACIL CHEMO INJECTION 2.5 GM/50ML
400.0000 mg/m2 | Freq: Once | INTRAVENOUS | Status: AC
  Administered 2024-07-03: 1000 mg via INTRAVENOUS
  Filled 2024-07-03: qty 20

## 2024-07-03 MED ORDER — DEXAMETHASONE SOD PHOSPHATE PF 10 MG/ML IJ SOLN
10.0000 mg | Freq: Once | INTRAMUSCULAR | Status: AC
  Administered 2024-07-03: 10 mg via INTRAVENOUS

## 2024-07-03 MED ORDER — SODIUM CHLORIDE 0.9 % IV SOLN
2400.0000 mg/m2 | INTRAVENOUS | Status: DC
  Administered 2024-07-03: 5850 mg via INTRAVENOUS
  Filled 2024-07-03: qty 117

## 2024-07-03 MED ORDER — DEXTROSE 5 % IV SOLN: INTRAVENOUS | Status: DC

## 2024-07-03 NOTE — Patient Instructions (Signed)
 [facility] - A DEPT OF [place]  Discharge Instructions: Thank you for choosing [facility] to provide your oncology and hematology care.   If you have a lab appointment with the Cancer Center, please go directly to the Cancer Center and check in at the registration area.   Wear comfortable clothing and clothing appropriate for easy access to any Portacath or PICC line.   We strive to give you quality time with your provider. You may need to reschedule your appointment if you arrive late (15 or more minutes).  Arriving late affects you and other patients whose appointments are after yours.  Also, if you miss three or more appointments without notifying the office, you may be dismissed from the clinic at the providers discretion.      For prescription refill requests, have your pharmacy contact our office and allow 72 hours for refills to be completed.    Today you received the following chemotherapy and/or immunotherapy agents oxaliplatin  leucovorin  adrucil       To help prevent nausea and vomiting after your treatment, we encourage you to take your nausea medication as directed.  BELOW ARE SYMPTOMS THAT SHOULD BE REPORTED IMMEDIATELY: *FEVER GREATER THAN 100.4 F (38 C) OR HIGHER *CHILLS OR SWEATING *NAUSEA AND VOMITING THAT IS NOT CONTROLLED WITH YOUR NAUSEA MEDICATION *UNUSUAL SHORTNESS OF BREATH *UNUSUAL BRUISING OR BLEEDING *URINARY PROBLEMS (pain or burning when urinating, or frequent urination) *BOWEL PROBLEMS (unusual diarrhea, constipation, pain near the anus) TENDERNESS IN MOUTH AND THROAT WITH OR WITHOUT PRESENCE OF ULCERS (sore throat, sores in mouth, or a toothache) UNUSUAL RASH, SWELLING OR PAIN  UNUSUAL VAGINAL DISCHARGE OR ITCHING   Items with * indicate a potential emergency and should be followed up as soon as possible or go to the Emergency Department if any problems should occur.  Please show the CHEMOTHERAPY ALERT  CARD or IMMUNOTHERAPY ALERT CARD at check-in to the Emergency Department and triage nurse.  Should you have questions after your visit or need to cancel or reschedule your appointment, please contact [facility]  Dept: [phone]  and follow the prompts.  Office hours are 8:00 a.m. to 4:30 p.m. Monday - Friday. Please note that voicemails left after 4:00 p.m. may not be returned until the following business day.  We are closed weekends and major holidays. You have access to a nurse at all times for urgent questions. Please call the main number to the clinic Dept: [phone] and follow the prompts.   For any non-urgent questions, you may also contact your provider using MyChart. We now offer e-Visits for anyone 57 and older to request care online for non-urgent symptoms. For details visit [email or web address].   Also download the MyChart app! Go to the app store, search MyChart, open the app, select [place], and log in with your MyChart username and password.

## 2024-07-03 NOTE — Assessment & Plan Note (Signed)
 Please review oncology history for additional details and timeline of events.  After obtaining cardiac clearance, Dr. Debby performed robotic right colectomy on 04/12/2024.  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  Submucosal invasion was not identified.  Metastatic adenocarcinoma was noted in 2 out of 36 lymph nodes.  Margins were negative for carcinoma or dysplasia.  No LVI, PNI. pTis,pN1b,cM0, Stage III A disease.  MMR proficient.  Staging CT chest on 05/02/2024 showed no evidence of intrathoracic metastatic disease.  Previously CT abdomen and pelvis on 01/29/2024 showed no evidence of metastatic disease.  Stage III colon cancer located in the cecum, status post resection with negative margins.   Chemotherapy is recommended to prevent recurrence due to the risk of microscopic disease. Two chemotherapy options were discussed: FOLFOX regimen with a pump every two weeks or CAPEOX regimen with oral pills every three weeks. The FOLFOX regimen is preferred initially, with the option to switch to CAPEOX if needed due to cardiac history. Common side effects of chemotherapy include nausea, vomiting, fatigue, low blood counts, tingling, numbness, cold sensitivity, and diarrhea. The goal of chemotherapy is to prevent recurrence and aim for a complete cure.  - We did proceed with guardant reveal test on 05/03/2024 and it showed no evidence of circulating tumor DNA.  We also checked Guardant 360 to look for any actionable mutations.  In case of PIK3CA mutation, we will start him on aspirin 100 mg daily.  Plan made to proceed with adjuvant systemic chemotherapy with FOLFOX.  Started from 05/23/2024.  He has been tolerating chemotherapy reasonably well except for mild, self-limited nausea, controlled heartburn, and transient cold sensitivity and mild leukopenia.   Due for cycle 4 of chemotherapy today.  Labs reveal normal white count of 5200 with normal differential.   Platelet 136,000.  Mildly increased AST, ALT and alkaline phosphatase, likely from recent Tylenol  use.  Overall no dose-limiting toxicities.  Will proceed with cycle 4 as scheduled.  Will proceed with Neulasta  on the day of 5-FU pump removal with current cycle and as needed going forward.  - Schedule colonoscopy one year post-surgery.  - Monitor for cardiac arrhythmias due to chemotherapy.  - Advise on dietary modifications to avoid greasy and spicy foods.  - Limit alcohol intake to two beers, not more than 1 day a week, and avoid around chemotherapy days.  RTC in 2 weeks for labs, office visit and cycle 5 of chemotherapy.

## 2024-07-03 NOTE — Progress Notes (Signed)
 "  [facility]  ONCOLOGY CLINIC PROGRESS NOTE   Patient Care Team: Barbra Odor, NP as PCP - General (Nurse Practitioner) Waddell Danelle ORN, MD as PCP - Electrophysiology (Cardiology) Raford Riggs, MD as PCP - Cardiology (Cardiology) Milissa Clack, MD as Referring Physician (Physical Medicine and Rehabilitation) Debby Hila, MD as Consulting Physician (General Surgery)  PATIENT NAME: Caleb [name]   MR#: [id number] DOB: [birth year]  Date of visit: 07/03/2024   ASSESSMENT & PLAN:   Caleb [name] is a 63 y.o.  gentleman with a past medical history of hypertension, diabetes mellitus type 2, dyslipidemia, atrial flutter status post ablation, was referred to our clinic for newly diagnosed adenocarcinoma of the cecum, stage III, MMR proficient, status post hemicolectomy on 04/12/2024.   Adenocarcinoma of cecum [facility]) Please review oncology history for additional details and timeline of events.  After obtaining cardiac clearance, Dr. Debby performed robotic right colectomy on 04/12/2024.  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  Submucosal invasion was not identified.  Metastatic adenocarcinoma was noted in 2 out of 36 lymph nodes.  Margins were negative for carcinoma or dysplasia.  No LVI, PNI. pTis,pN1b,cM0, Stage III A disease.  MMR proficient.  Staging CT chest on 05/02/2024 showed no evidence of intrathoracic metastatic disease.  Previously CT abdomen and pelvis on 01/29/2024 showed no evidence of metastatic disease.  Stage III colon cancer located in the cecum, status post resection with negative margins.   Chemotherapy is recommended to prevent recurrence due to the risk of microscopic disease. Two chemotherapy options were discussed: FOLFOX regimen with a pump every two weeks or CAPEOX regimen with oral pills every three weeks. The FOLFOX regimen is preferred initially, with the option to switch to CAPEOX if needed  due to cardiac history. Common side effects of chemotherapy include nausea, vomiting, fatigue, low blood counts, tingling, numbness, cold sensitivity, and diarrhea. The goal of chemotherapy is to prevent recurrence and aim for a complete cure.  - We did proceed with guardant reveal test on 05/03/2024 and it showed no evidence of circulating tumor DNA.  We also checked Guardant 360 to look for any actionable mutations.  In case of PIK3CA mutation, we will start him on aspirin 100 mg daily.  Plan made to proceed with adjuvant systemic chemotherapy with FOLFOX.  Started from 05/23/2024.  He has been tolerating chemotherapy reasonably well except for mild, self-limited nausea, controlled heartburn, and transient cold sensitivity and mild leukopenia.   Due for cycle 4 of chemotherapy today.  Labs reveal normal white count of 5200 with normal differential.  Platelet 136,000.  Mildly increased AST, ALT and alkaline phosphatase, likely from recent Tylenol  use.  Overall no dose-limiting toxicities.  Will proceed with cycle 4 as scheduled.  Will proceed with Neulasta  on the day of 5-FU pump removal with current cycle and as needed going forward.  - Schedule colonoscopy one year post-surgery.  - Monitor for cardiac arrhythmias due to chemotherapy.  - Advise on dietary modifications to avoid greasy and spicy foods.  - Limit alcohol intake to two beers, not more than 1 day a week, and avoid around chemotherapy days.  RTC in 2 weeks for labs, office visit and cycle 5 of chemotherapy.  Chemotherapy-induced anorexia and fatigue He reports persistent anorexia, early satiety, mild dysgeusia, and increased fatigue attributed to chemotherapy, but remains physically active. No associated nausea or vomiting. Appetite stimulant therapy was discussed to address decreased oral intake and energy. - Prescribed Megace  liquid  once daily as an appetite stimulant, to be initiated if anorexia persists. - Provided  anticipatory guidance regarding potential improvement in appetite and energy with Megace . - Advised to maintain physical activity.  Chemotherapy-induced thrombocytopenia Platelet count is mildly decreased at 136,000, consistent with chemotherapy-induced thrombocytopenia, but remains above the threshold for holding chemotherapy. No chemotherapy dose adjustment required at this time. - Monitored platelet count; continued chemotherapy as platelet count remains above 75,000.  Chemotherapy-induced peripheral neuropathy Developed cold sensitivity in fingers and mouth, a common side effect of chemotherapy. Symptoms may persist during chemotherapy breaks but are expected to resolve post-treatment. Tingling and numbness may limit chemotherapy dosage. - Continue to monitor neuropathy symptoms and adjust chemotherapy dosage if necessary  Gastroesophageal reflux disease Symptoms improved with pantoprazole .  - Continue pantoprazole   Atrial flutter, status post ablation, on flecainide  Atrial flutter, status post two ablations with the second being successful. Currently on flecainide . Chemotherapy may increase the risk of cardiac arrhythmias, particularly with the FOLFOX regimen. - Monitor cardiac status closely during chemotherapy. - Consider switching to CAPOX regimen if cardiac issues arise.    I reviewed lab results and outside records for this visit and discussed relevant results with the patient. Diagnosis, plan of care and treatment options were also discussed in detail with the patient. Opportunity provided to ask questions and answers provided to his apparent satisfaction. Provided instructions to call our clinic with any problems, questions or concerns prior to return visit. I recommended to continue follow-up with PCP and sub-specialists. He verbalized understanding and agreed with the plan.   NCCN guidelines have been consulted in the planning of this patients care.  I spent a total of 42  minutes during this encounter with the patient including review of chart and various tests results, discussions about plan of care and coordination of care plan.   [name], MD  07/03/2024  [facility] - A DEPT OF [facility] [address] [place] [address] Dept: [phone] Dept Fax: [phone]    CHIEF COMPLAINT/ REASON FOR VISIT:   Adenocarcinoma of the cecum, stage III, MMR proficient, status post hemicolectomy on 04/12/2024.   Current Treatment: Started adjuvant systemic chemotherapy with FOLFOX from 05/23/2024.  INTERVAL HISTORY:    Discussed the use of AI scribe software for clinical note transcription with the patient, who gave verbal consent to proceed.  History of Present Illness  Caleb [name] is a 63 year old male with colorectal cancer undergoing chemotherapy who presents with persistent anorexia.  He reports persistent anorexia for the past several weeks, characterized by early satiety and a sensation described as a 'funny feeling' in his stomach that makes eating unappealing. He experiences mild dysgeusia. He denies nausea, vomiting, or diarrhea.  He notes increased fatigue following his most recent chemotherapy cycle, with the fatigue lasting longer than after previous cycles. Despite this, he remains physically active, walking in his neighborhood. He also reports myalgias, for which he uses acetaminophen  and ibuprofen.  He endorses cold sensitivity but denies paresthesias. There have been no recent chemotherapy dose adjustments. He denies abnormal bleeding or bruising.   I have reviewed the past medical history, past surgical history, social history and family history with the patient and they are unchanged from previous note.  HISTORY OF PRESENT ILLNESS:   ONCOLOGY HISTORY:   Patient had a screening colonoscopy when he was 63 years old which showed some benign polyps.  He did not  have a follow-up  colonoscopy after that.   He had mild anemia in June 2025 with hemoglobin of 12.1, MCV 76.  His PCP obtained additional workup including Cologuard test.  Cologuard came back positive.   He had a colonoscopy on 01/22/2024, performed by Dr. Abran.  It showed 12 mm apparently did polyp in the rectum, 4 polyps in the descending colon and transverse colon measuring 3 to 8 mm in size.  In the cecum there was a 4 to 5 cm soft polypoid mass which was friable.  This was biopsied with cold forceps.  In addition there were 4 PET included in semipedunculated polyps measuring between 10 and 25 mm.  This area was tattooed.  Multiple diverticula were noted in the colon.  Internal hemorrhoids.  He had an EGD that day which showed evidence of esophagitis and stricture.  Hiatal hernia was noted.  Question portal hypertensive gastropathy.   Pathology showed superficial fragments of tubulovillous adenoma from the cecal mass.  It was negative for high-grade dysplasia or carcinoma.  Rest of the polyps were either hyperplastic or tubular adenomas without evidence of malignancy or high-grade dysplasia.   On 01/29/2024, CT abdomen and pelvis was obtained for further evaluation which showed what appeared to be 4.3 cm intraluminal soft tissue density in the ascending colon, highly suspicious for polypoid intraluminal mass.  No evidence of metastatic disease.  Colonic diverticulosis was noted.  Probable hepatic cirrhosis was also noted.   With these findings, patient was referred to Dr. Bernarda Ned, general surgery for further evaluation.  Though the biopsy did not show evidence of malignancy, given the size of the mass and clinical picture, Dr. Ned recommended proceeding with robotic assisted right colectomy.   After obtaining cardiac clearance, Dr. Ned performed robotic right colectomy on 04/12/2024.  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  Submucosal invasion was  not identified.  Metastatic adenocarcinoma was noted in 2 out of 36 lymph nodes.  Margins were negative for carcinoma or dysplasia.  No LVI, PNI. pTis,pN1b,cM0, Stage III A disease.  MMR proficient.   Staging CT chest on 05/02/2024 showed no evidence of intrathoracic metastatic disease.   Given lymph node positive adenocarcinoma of the cecum, recommended adjuvant chemotherapy with FOLFOX regimen.  We will watch him closely for any development of cardiac arrhythmias.  Started cycle 1 of FOLFOX from 05/23/2024.  Oncology History  Adenocarcinoma of cecum (HCC)  05/03/2024 Initial Diagnosis   Adenocarcinoma of cecum (HCC)   05/03/2024 Cancer Staging   Staging form: Colon and Rectum, AJCC 8th Edition - Clinical: Stage IIIA (cT1, cN1b, cM0) - Signed by Autumn Millman, MD on 05/03/2024 Histologic grade (G): G1 Histologic grading system: 4 grade system   05/23/2024 -  Chemotherapy   Patient is on Treatment Plan : COLORECTAL FOLFOX q14d x 3 months         REVIEW OF SYSTEMS:   Review of Systems - Oncology  All other pertinent systems were reviewed with the patient and are negative.  ALLERGIES: He has no known allergies.  MEDICATIONS:  Current Outpatient Medications  Medication Sig Dispense Refill   megestrol  (MEGACE  ES) 625 MG/5ML suspension Take 5 mLs (625 mg total) by mouth daily. 150 mL 3   atorvastatin  (LIPITOR) 20 MG tablet Take 20 mg by mouth daily.     Blood Glucose Monitoring Suppl (TRUE METRIX AIR GLUCOSE METER) DEVI Check CBG BID 1 each 11   colchicine  0.6 MG tablet Take 0.6 mg by mouth as needed (gout).  dexamethasone  (DECADRON ) 4 MG tablet Take 2 tablets (8 mg total) by mouth daily. Start the day after chemotherapy for 2 days. Take with food. (Patient not taking: Reported on 05/30/2024) 30 tablet 1   flecainide  (TAMBOCOR ) 100 MG tablet TAKE 1 TABLET BY MOUTH EVERY 12 HOURS 30 tablet 0   lidocaine -prilocaine  (EMLA ) cream Apply to affected area once 30 g 3   metFORMIN   (GLUCOPHAGE ) 500 MG tablet Take 500-1,000 mg by mouth See admin instructions. Take 1000 mg in the morning and 500 mg in the evening     metoprolol  succinate (TOPROL -XL) 50 MG 24 hr tablet Take 1 tablet (50 mg total) by mouth 2 (two) times daily. 30 tablet 0   ondansetron  (ZOFRAN ) 8 MG tablet Take 1 tablet (8 mg total) by mouth every 8 (eight) hours as needed for nausea or vomiting. Start on the third day after chemotherapy. 30 tablet 1   pantoprazole  (PROTONIX ) 40 MG tablet Take 1 tablet (40 mg total) by mouth daily. 30 tablet 5   prochlorperazine  (COMPAZINE ) 10 MG tablet Take 1 tablet (10 mg total) by mouth every 6 (six) hours as needed for nausea or vomiting. (Patient not taking: Reported on 05/30/2024) 30 tablet 1   tadalafil (CIALIS) 20 MG tablet Take 20 mg by mouth daily as needed for erectile dysfunction. (Patient not taking: Reported on 05/30/2024)     traMADol  (ULTRAM ) 50 MG tablet Take 1 tablet (50 mg total) by mouth every 6 (six) hours as needed for moderate pain (pain score 4-6) or severe pain (pain score 7-10). 30 tablet 0   No current facility-administered medications for this visit.   Facility-Administered Medications Ordered in Other Visits  Medication Dose Route Frequency Provider Last Rate Last Admin   dextrose  5 % solution   Intravenous Continuous Shahla Betsill, MD   Stopped at 07/03/24 1143   fluorouracil  (ADRUCIL ) 5,850 mg in sodium chloride  0.9 % 133 mL chemo infusion  2,400 mg/m2 (Treatment Plan Recorded) Intravenous 1 day or 1 dose Kamauri Kathol, MD   Infusion Verify at 07/03/24 1151   sodium chloride  flush (NS) 0.9 % injection 10 mL  10 mL Intracatheter PRN Vista Sawatzky, MD         VITALS:   There were no vitals taken for this visit.  Wt Readings from Last 3 Encounters:  06/20/24 249 lb (112.9 kg)  06/05/24 252 lb (114.3 kg)  05/30/24 254 lb (115.2 kg)    There is no height or weight on file to calculate BMI.   Onc Performance Status - 07/03/24 1500        ECOG Perf Status   ECOG Perf Status Fully active, able to carry on all pre-disease performance without restriction      KPS SCALE   KPS % SCORE Normal, no compliants, no evidence of disease            PHYSICAL EXAM:   Physical Exam Constitutional:      General: He is not in acute distress.    Appearance: Normal appearance.  HENT:     Head: Normocephalic and atraumatic.  Eyes:     Conjunctiva/sclera: Conjunctivae normal.  Cardiovascular:     Rate and Rhythm: Normal rate and regular rhythm.  Pulmonary:     Effort: Pulmonary effort is normal. No respiratory distress.  Chest:     Comments: Port-A-Cath in place without any signs of infection Abdominal:     General: There is no distension.     Comments: Scars from recent surgery  have healed well  Neurological:     General: No focal deficit present.     Mental Status: He is alert and oriented to person, place, and time.  Psychiatric:        Mood and Affect: Mood normal.        Behavior: Behavior normal.      LABORATORY DATA:   I have reviewed the data as listed.  Results for orders placed or performed in visit on 07/03/24  CBC with Differential (Cancer Center Only)  Result Value Ref Range   WBC Count 5.2 4.0 - 10.5 K/uL   RBC 4.93 4.22 - 5.81 MIL/uL   Hemoglobin 14.3 13.0 - 17.0 g/dL   HCT 58.0 60.9 - 47.9 %   MCV 85.0 80.0 - 100.0 fL   MCH 29.0 26.0 - 34.0 pg   MCHC 34.1 30.0 - 36.0 g/dL   RDW 85.3 88.4 - 84.4 %   Platelet Count 136 (L) 150 - 400 K/uL   nRBC 0.0 0.0 - 0.2 %   Neutrophils Relative % 66 %   Neutro Abs 3.5 1.7 - 7.7 K/uL   Lymphocytes Relative 22 %   Lymphs Abs 1.1 0.7 - 4.0 K/uL   Monocytes Relative 8 %   Monocytes Absolute 0.4 0.1 - 1.0 K/uL   Eosinophils Relative 2 %   Eosinophils Absolute 0.1 0.0 - 0.5 K/uL   Basophils Relative 1 %   Basophils Absolute 0.1 0.0 - 0.1 K/uL   Immature Granulocytes 1 %   Abs Immature Granulocytes 0.03 0.00 - 0.07 K/uL  CMP (Cancer Center only)  Result  Value Ref Range   Sodium 138 135 - 145 mmol/L   Potassium 3.6 3.5 - 5.1 mmol/L   Chloride 102 98 - 111 mmol/L   CO2 25 22 - 32 mmol/L   Glucose, Bld 150 (H) 70 - 99 mg/dL   BUN 7 (L) 8 - 23 mg/dL   Creatinine 8.95 9.38 - 1.24 mg/dL   Calcium  9.4 8.9 - 10.3 mg/dL   Total Protein 7.9 6.5 - 8.1 g/dL   Albumin  4.2 3.5 - 5.0 g/dL   AST 82 (H) 15 - 41 U/L   ALT 58 (H) 0 - 44 U/L   Alkaline Phosphatase 136 (H) 38 - 126 U/L   Total Bilirubin 0.6 0.0 - 1.2 mg/dL   GFR, Estimated >39 >39 mL/min   Anion gap 12 5 - 15      RADIOGRAPHIC STUDIES:  I have personally reviewed the radiological images as listed and agree with the findings in the report.  IR IMAGING GUIDED PORT INSERTION CLINICAL DATA:  Adenocarcinoma of the cecum.  EXAM: Chest port catheter placement  TECHNIQUE: Procedure performed using fluoroscopy and ultrasound  CONTRAST:  None  RADIOPHARMACEUTICALS:  None  FLUOROSCOPY: 0.2 minutes 2.1 mGy  COMPARISON:  None  FINDINGS: The patient was placed in supine position on the IR gantry and the right upper chest and neck were prepped and draped in the usual sterile fashion. The nurse administered intravenous fentanyl  and Versed  under my supervision and the nurse had no other injuries other than monitoring the patient and administering medications. I was present for the entire duration of procedure. 3 mg intravenous Versed , 100 mcg intravenous fentanyl , and 25 mg intravenous Benadryl  were administered for a total sedation time of 16 minutes.  Ultrasound guidance was used to investigate the right internal jugular vein which was anechoic and compressible indicating patency. The needle was then advanced from a scan negative through  the soft tissue into the right internal jugular vein under ultrasound guidance. A final image was obtained and stored in the patient's permanent medical record.  Access was then exchanged over a guidewire which was advanced  under fluoroscopic guidance. The needle was removed and replaced with a micropuncture sheath. Approximately 2 inches below the clavicle the port pocket was created with a subsequent incision. The catheter was then tunneled from the port pocket to the venotomy site overlying the right internal jugular vein. Access was then exchanged over an 035 guidewire for peel-away sheath which was advanced over the guidewire under fluoroscopic guidance. The catheter was then advanced through the peel-away sheath to the sinoatrial junction. Sheath was removed. The catheter was then cut at the port pocket and connected to chest port. The chest port was tested for function and finally function well. The chest port was then flushed with heparin  and a port pocket was closed with 4-0 suture. Final image was obtained demonstrating satisfactory position of chest port. The final count of all materials was satisfactory.  IMPRESSION: 1. Satisfactory placement of right internal jugular vein single-lumen chest port. Catheter tip is at the cavoatrial junction.  2.  Okay to use and power inject chest port.  Electronically Signed   By: Cordella Banner   On: 05/16/2024 16:31    CODE STATUS:  Code Status History     Date Active Date Inactive Code Status Order ID Comments User Context   05/16/2024 1451 05/17/2024 0521 Full Code [id number]  Banner Cordella LABOR, MD HOV   04/12/2024 1523 04/13/2024 1633 Full Code [id number]  Debby Hila, MD Inpatient   09/29/2021 1048 09/29/2021 2301 Full Code [id number]  Waddell Danelle ORN, MD Inpatient   08/02/2021 1122 08/02/2021 2215 Full Code [id number]  Waddell Danelle ORN, MD Inpatient   09/30/2020 1930 10/01/2020 1719 Full Code [id number]  Cheryle Debby LABOR, MD Inpatient   04/03/2020 2150 04/08/2020 2149 Full Code [id number]  Shona Terry SAILOR, DO Inpatient    Questions for Most Recent Historical Code Status (Order [id number])     Question Answer   By: Consent: discussion documented in EHR             No orders of the defined types were placed in this encounter.    Future Appointments  Date Time Provider Department Center  07/05/2024  9:45 AM [facility] FLUSH [facility] None  07/18/2024  9:00 AM [facility] FLUSH [facility] None  07/18/2024  9:15 AM Elmarie Devlin, MD [facility] None  07/18/2024 10:00 AM [facility] INFUSION [facility] None  07/20/2024  1:45 PM [facility] FLUSH [facility] None  08/01/2024  9:00 AM [facility] FLUSH [facility] None  08/01/2024  9:15 AM Johnavon Mcclafferty, MD [facility] None  08/01/2024 10:00 AM [facility] INFUSION [facility] None  08/03/2024  1:30 PM [facility] FLUSH [facility] None     This document was completed utilizing speech recognition software. Grammatical errors, random word insertions, pronoun errors, and incomplete sentences are an occasional consequence of this system due to software limitations, ambient noise, and hardware issues. Any formal questions or concerns about the content, text or information contained within the body of this dictation should be directly addressed to the provider for clarification.   "

## 2024-07-05 ENCOUNTER — Inpatient Hospital Stay

## 2024-07-05 VITALS — BP 142/83 | HR 76 | Temp 97.9°F | Resp 17 | Wt 240.0 lb

## 2024-07-05 DIAGNOSIS — C180 Malignant neoplasm of cecum: Secondary | ICD-10-CM

## 2024-07-05 DIAGNOSIS — Z5111 Encounter for antineoplastic chemotherapy: Secondary | ICD-10-CM | POA: Diagnosis not present

## 2024-07-05 MED ORDER — PEGFILGRASTIM INJECTION 6 MG/0.6ML ~~LOC~~
6.0000 mg | PREFILLED_SYRINGE | Freq: Once | SUBCUTANEOUS | Status: AC
  Administered 2024-07-05: 6 mg via SUBCUTANEOUS
  Filled 2024-07-05: qty 0.6

## 2024-07-13 ENCOUNTER — Encounter: Payer: Self-pay | Admitting: Oncology

## 2024-07-18 ENCOUNTER — Other Ambulatory Visit: Payer: Self-pay

## 2024-07-18 ENCOUNTER — Telehealth (HOSPITAL_COMMUNITY): Payer: Self-pay

## 2024-07-18 ENCOUNTER — Encounter: Payer: Self-pay | Admitting: Oncology

## 2024-07-18 ENCOUNTER — Inpatient Hospital Stay: Payer: Self-pay

## 2024-07-18 ENCOUNTER — Inpatient Hospital Stay: Payer: Self-pay | Attending: Oncology

## 2024-07-18 ENCOUNTER — Other Ambulatory Visit (HOSPITAL_COMMUNITY): Payer: Self-pay

## 2024-07-18 ENCOUNTER — Inpatient Hospital Stay: Payer: Self-pay | Attending: Oncology | Admitting: Oncology

## 2024-07-18 VITALS — BP 149/73 | HR 70 | Temp 97.3°F | Resp 18 | Wt 239.4 lb

## 2024-07-18 DIAGNOSIS — C180 Malignant neoplasm of cecum: Secondary | ICD-10-CM

## 2024-07-18 DIAGNOSIS — Z79899 Other long term (current) drug therapy: Secondary | ICD-10-CM | POA: Diagnosis not present

## 2024-07-18 DIAGNOSIS — C779 Secondary and unspecified malignant neoplasm of lymph node, unspecified: Secondary | ICD-10-CM | POA: Insufficient documentation

## 2024-07-18 DIAGNOSIS — Z5111 Encounter for antineoplastic chemotherapy: Secondary | ICD-10-CM | POA: Diagnosis present

## 2024-07-18 DIAGNOSIS — Z5189 Encounter for other specified aftercare: Secondary | ICD-10-CM | POA: Diagnosis not present

## 2024-07-18 LAB — CMP (CANCER CENTER ONLY)
ALT: 20 U/L (ref 0–44)
AST: 31 U/L (ref 15–41)
Albumin: 3.6 g/dL (ref 3.5–5.0)
Alkaline Phosphatase: 146 U/L — ABNORMAL HIGH (ref 38–126)
Anion gap: 10 (ref 5–15)
BUN: 8 mg/dL (ref 8–23)
CO2: 29 mmol/L (ref 22–32)
Calcium: 8.8 mg/dL — ABNORMAL LOW (ref 8.9–10.3)
Chloride: 102 mmol/L (ref 98–111)
Creatinine: 0.89 mg/dL (ref 0.61–1.24)
GFR, Estimated: >60 mL/min
Glucose, Bld: 153 mg/dL — ABNORMAL HIGH (ref 70–99)
Potassium: 3.4 mmol/L — ABNORMAL LOW (ref 3.5–5.1)
Sodium: 141 mmol/L (ref 135–145)
Total Bilirubin: 0.5 mg/dL (ref 0.0–1.2)
Total Protein: 7 g/dL (ref 6.5–8.1)

## 2024-07-18 LAB — CBC WITH DIFFERENTIAL (CANCER CENTER ONLY)
Abs Immature Granulocytes: 0.11 K/uL — ABNORMAL HIGH (ref 0.00–0.07)
Basophils Absolute: 0.1 K/uL (ref 0.0–0.1)
Basophils Relative: 1 %
Eosinophils Absolute: 0.1 K/uL (ref 0.0–0.5)
Eosinophils Relative: 1 %
HCT: 40.7 % (ref 39.0–52.0)
Hemoglobin: 13.7 g/dL (ref 13.0–17.0)
Immature Granulocytes: 2 %
Lymphocytes Relative: 21 %
Lymphs Abs: 1.4 K/uL (ref 0.7–4.0)
MCH: 28.4 pg (ref 26.0–34.0)
MCHC: 33.7 g/dL (ref 30.0–36.0)
MCV: 84.4 fL (ref 80.0–100.0)
Monocytes Absolute: 0.5 K/uL (ref 0.1–1.0)
Monocytes Relative: 8 %
Neutro Abs: 4.6 K/uL (ref 1.7–7.7)
Neutrophils Relative %: 67 %
Platelet Count: 191 K/uL (ref 150–400)
RBC: 4.82 MIL/uL (ref 4.22–5.81)
RDW: 15.8 % — ABNORMAL HIGH (ref 11.5–15.5)
WBC Count: 6.8 K/uL (ref 4.0–10.5)
nRBC: 0.3 % — ABNORMAL HIGH (ref 0.0–0.2)

## 2024-07-18 MED ORDER — COLCHICINE 0.6 MG PO TABS: 0.6000 mg | ORAL_TABLET | ORAL | 3 refills | Status: AC | PRN

## 2024-07-18 MED ORDER — OXALIPLATIN CHEMO INJECTION 100 MG/20ML
85.0000 mg/m2 | Freq: Once | INTRAVENOUS | Status: AC
  Administered 2024-07-18: 200 mg via INTRAVENOUS
  Filled 2024-07-18: qty 6.68

## 2024-07-18 MED ORDER — PALONOSETRON HCL INJECTION 0.25 MG/5ML
0.2500 mg | Freq: Once | INTRAVENOUS | Status: AC
  Administered 2024-07-18: 0.25 mg via INTRAVENOUS
  Filled 2024-07-18: qty 5

## 2024-07-18 MED ORDER — LEUCOVORIN CALCIUM INJECTION 350 MG
400.0000 mg/m2 | Freq: Once | INTRAVENOUS | Status: AC
  Administered 2024-07-18: 972 mg via INTRAVENOUS
  Filled 2024-07-18: qty 48.6

## 2024-07-18 MED ORDER — FLUOROURACIL CHEMO INJECTION 2.5 GM/50ML
400.0000 mg/m2 | Freq: Once | INTRAVENOUS | Status: AC
  Administered 2024-07-18: 1000 mg via INTRAVENOUS
  Filled 2024-07-18: qty 20

## 2024-07-18 MED ORDER — POTASSIUM CHLORIDE CRYS ER 20 MEQ PO TBCR
40.0000 meq | EXTENDED_RELEASE_TABLET | Freq: Once | ORAL | Status: AC
  Administered 2024-07-18: 40 meq via ORAL
  Filled 2024-07-18: qty 2

## 2024-07-18 MED ORDER — SODIUM CHLORIDE 0.9 % IV SOLN
2400.0000 mg/m2 | INTRAVENOUS | Status: DC
  Administered 2024-07-18: 5850 mg via INTRAVENOUS
  Filled 2024-07-18: qty 117

## 2024-07-18 MED ORDER — DEXAMETHASONE SOD PHOSPHATE PF 10 MG/ML IJ SOLN
10.0000 mg | Freq: Once | INTRAMUSCULAR | Status: AC
  Administered 2024-07-18: 10 mg via INTRAVENOUS

## 2024-07-18 MED ORDER — MEGESTROL ACETATE 625 MG/5ML PO SUSP
625.0000 mg | Freq: Every day | ORAL | 3 refills | Status: AC
  Filled 2024-07-18 (×2): qty 150, 30d supply, fill #0

## 2024-07-18 MED ORDER — DEXTROSE 5 % IV SOLN: INTRAVENOUS | Status: DC

## 2024-07-18 NOTE — Progress Notes (Signed)
 "  [facility]  ONCOLOGY CLINIC PROGRESS NOTE   Patient Care Team: Barbra Odor, NP as PCP - General (Nurse Practitioner) Waddell Danelle ORN, MD as PCP - Electrophysiology (Cardiology) Raford Riggs, MD as PCP - Cardiology (Cardiology) Milissa Clack, MD as Referring Physician (Physical Medicine and Rehabilitation) Debby Hila, MD as Consulting Physician (General Surgery)  PATIENT NAME: Caleb [name]   MR#: [id number] DOB: Feb 09, 1961  Date of visit: 07/18/2024   ASSESSMENT & PLAN:   Caleb [name] is a 64 y.o.  gentleman with a past medical history of hypertension, diabetes mellitus type 2, dyslipidemia, atrial flutter status post ablation, was referred to our clinic for newly diagnosed adenocarcinoma of the cecum, stage III, MMR proficient, status post hemicolectomy on 04/12/2024.   Adenocarcinoma of cecum [facility]) Please review oncology history for additional details and timeline of events.  After obtaining cardiac clearance, Dr. Debby performed robotic right colectomy on 04/12/2024.  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  Submucosal invasion was not identified.  Metastatic adenocarcinoma was noted in 2 out of 36 lymph nodes.  Margins were negative for carcinoma or dysplasia.  No LVI, PNI. pTis,pN1b,cM0, Stage III A disease.  MMR proficient.  Staging CT chest on 05/02/2024 showed no evidence of intrathoracic metastatic disease.  Previously CT abdomen and pelvis on 01/29/2024 showed no evidence of metastatic disease.  Stage III colon cancer located in the cecum, status post resection with negative margins.   Chemotherapy is recommended to prevent recurrence due to the risk of microscopic disease. Two chemotherapy options were discussed: FOLFOX regimen with a pump every two weeks or CAPEOX regimen with oral pills every three weeks. The FOLFOX regimen is preferred initially, with the option to switch to CAPEOX if needed  due to cardiac history. Common side effects of chemotherapy include nausea, vomiting, fatigue, low blood counts, tingling, numbness, cold sensitivity, and diarrhea. The goal of chemotherapy is to prevent recurrence and aim for a complete cure.  - We did proceed with guardant reveal test on 05/03/2024 and it showed no evidence of circulating tumor DNA.  We also checked Guardant 360 to look for any actionable mutations.  In case of PIK3CA mutation, we will start him on aspirin 100 mg daily.  Plan made to proceed with adjuvant systemic chemotherapy with FOLFOX.  Started from 05/23/2024.  He has been tolerating chemotherapy reasonably well except for mild, self-limited nausea, controlled heartburn, and transient cold sensitivity and mild leukopenia.   Due for cycle 4 of chemotherapy today.  Labs reveal normal white count of 5200 with normal differential.  Platelet 136,000.  Mildly increased AST, ALT and alkaline phosphatase, likely from recent Tylenol  use.  Overall no dose-limiting toxicities.  Will proceed with cycle 4 as scheduled.  Will proceed with Neulasta  on the day of 5-FU pump removal with current cycle and as needed going forward.  - Schedule colonoscopy one year post-surgery.  - Monitor for cardiac arrhythmias due to chemotherapy.  - Advise on dietary modifications to avoid greasy and spicy foods.  - Limit alcohol intake to two beers, not more than 1 day a week, and avoid around chemotherapy days.  RTC in 2 weeks for labs, office visit and cycle 5 of chemotherapy.  Gout He experienced a recent gout flare, managed with colchicine  as previously instructed. He reported severe diarrhea and transient dehydration following colchicine  administration, which resolved with increased oral fluid intake. He is currently out of colchicine . - Sent prescription for colchicine  to [facility]  pharmacy.  Hypokalemia He has mild hypokalemia (potassium 3.4 mmol/L) on today's laboratory evaluation. He is not on  potassium supplementation and is asymptomatic. Dietary potassium intake was discussed. - Administered one dose of oral potassium in clinic. - Advised to increase dietary potassium intake (bananas, orange juice, oranges). - Monitor potassium levels on future laboratory studies.  Chemotherapy-induced anorexia and fatigue He reports persistent anorexia, early satiety, mild dysgeusia, and increased fatigue attributed to chemotherapy, but remains physically active. No associated nausea or vomiting. Appetite stimulant therapy was discussed to address decreased oral intake and energy. - Prescribed Megace  liquid once daily as an appetite stimulant, to be initiated if anorexia persists. - Provided anticipatory guidance regarding potential improvement in appetite and energy with Megace . - Advised to maintain physical activity.  Chemotherapy-induced peripheral neuropathy Developed cold sensitivity in fingers and mouth, a common side effect of chemotherapy. Symptoms may persist during chemotherapy breaks but are expected to resolve post-treatment. Tingling and numbness may limit chemotherapy dosage. - Continue to monitor neuropathy symptoms and adjust chemotherapy dosage if necessary  Gastroesophageal reflux disease Symptoms improved with pantoprazole .  - Continue pantoprazole   Atrial flutter, status post ablation, on flecainide  Atrial flutter, status post two ablations with the second being successful. Currently on flecainide . Chemotherapy may increase the risk of cardiac arrhythmias, particularly with the FOLFOX regimen. - Monitor cardiac status closely during chemotherapy. - Consider switching to CAPOX regimen if cardiac issues arise.    I reviewed lab results and outside records for this visit and discussed relevant results with the patient. Diagnosis, plan of care and treatment options were also discussed in detail with the patient. Opportunity provided to ask questions and answers provided to his  apparent satisfaction. Provided instructions to call our clinic with any problems, questions or concerns prior to return visit. I recommended to continue follow-up with PCP and sub-specialists. He verbalized understanding and agreed with the plan.   NCCN guidelines have been consulted in the planning of this patients care.  I spent a total of 42 minutes during this encounter with the patient including review of chart and various tests results, discussions about plan of care and coordination of care plan.   [name], MD  07/18/2024  [facility] - A DEPT OF [facility] [address] [place] [address] Dept: [phone] Dept Fax: [phone]    CHIEF COMPLAINT/ REASON FOR VISIT:   Adenocarcinoma of the cecum, stage III, MMR proficient, status post hemicolectomy on 04/12/2024.   Current Treatment: Started adjuvant systemic chemotherapy with FOLFOX from 05/23/2024.  INTERVAL HISTORY:    Discussed the use of AI scribe software for clinical note transcription with the patient, who gave verbal consent to proceed.  History of Present Illness  Caleb [name] is a 64 year old male with cecal adenocarcinoma, status post resection, currently receiving adjuvant chemotherapy, who presents for oncology follow-up to monitor treatment response and side effects.  He is undergoing adjuvant chemotherapy following surgical resection for cecal adenocarcinoma. Recent circulating tumor DNA testing performed in October did not detect tumor DNA.  He experienced a single episode of fever and chills overnight around [place], with a maximum temperature of 100.51F, which resolved spontaneously by the next morning. He did not take acetaminophen  due to previously elevated liver enzymes, which have since normalized.  He continues to have decreased appetite and has not started the prescribed appetite stimulant due to pharmacy issues; a  new prescription is being arranged.  He describes transient fatigue and low energy, particularly a few days after chemotherapy administration, with associated mood changes including depressed mood and thoughts of mortality. He feels well for the first couple of days post-treatment, then experiences a significant decline in energy and motivation. On New Year's Day, he felt particularly depressed and expressed concern about his prognosis. He does not wish to initiate pharmacologic treatment for depression at this time, preferring reassurance and support.  He had a recent severe gout flare managed with colchicine , which resulted in significant diarrhea and a brief episode of dehydration that resolved with increased oral intake. He is currently out of colchicine  and requests a refill. He is not taking potassium supplements and has mild hypokalemia on recent labs, with dietary potassium intake discussed.  He denies chest pain, shortness of breath, or new neurological symptoms. He reports a history of a heart murmur.   I have reviewed the past medical history, past surgical history, social history and family history with the patient and they are unchanged from previous note.  HISTORY OF PRESENT ILLNESS:   ONCOLOGY HISTORY:   Patient had a screening colonoscopy when he was 64 years old which showed some benign polyps.  He did not have a follow-up colonoscopy after that.   He had mild anemia in June 2025 with hemoglobin of 12.1, MCV 76.  His PCP obtained additional workup including Cologuard test.  Cologuard came back positive.   He had a colonoscopy on 01/22/2024, performed by Dr. Abran.  It showed 12 mm apparently did polyp in the rectum, 4 polyps in the descending colon and transverse colon measuring 3 to 8 mm in size.  In the cecum there was a 4 to 5 cm soft polypoid mass which was friable.  This was biopsied with cold forceps.  In addition there were 4 PET included in semipedunculated polyps measuring  between 10 and 25 mm.  This area was tattooed.  Multiple diverticula were noted in the colon.  Internal hemorrhoids.  He had an EGD that day which showed evidence of esophagitis and stricture.  Hiatal hernia was noted.  Question portal hypertensive gastropathy.   Pathology showed superficial fragments of tubulovillous adenoma from the cecal mass.  It was negative for high-grade dysplasia or carcinoma.  Rest of the polyps were either hyperplastic or tubular adenomas without evidence of malignancy or high-grade dysplasia.   On 01/29/2024, CT abdomen and pelvis was obtained for further evaluation which showed what appeared to be 4.3 cm intraluminal soft tissue density in the ascending colon, highly suspicious for polypoid intraluminal mass.  No evidence of metastatic disease.  Colonic diverticulosis was noted.  Probable hepatic cirrhosis was also noted.   With these findings, patient was referred to Dr. Bernarda Ned, general surgery for further evaluation.  Though the biopsy did not show evidence of malignancy, given the size of the mass and clinical picture, Dr. Ned recommended proceeding with robotic assisted right colectomy.   After obtaining cardiac clearance, Dr. Ned performed robotic right colectomy on 04/12/2024.  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  Submucosal invasion was not identified.  Metastatic adenocarcinoma was noted in 2 out of 36 lymph nodes.  Margins were negative for carcinoma or dysplasia.  No LVI, PNI. pTis,pN1b,cM0, Stage III A disease.  MMR proficient.   Staging CT chest on 05/02/2024 showed no evidence of intrathoracic metastatic disease.   Given lymph node positive adenocarcinoma of the cecum, recommended adjuvant chemotherapy with FOLFOX regimen.  We will watch  him closely for any development of cardiac arrhythmias.  Started cycle 1 of FOLFOX from 05/23/2024.  Oncology History  Adenocarcinoma of cecum (HCC)  05/03/2024  Initial Diagnosis   Adenocarcinoma of cecum (HCC)   05/03/2024 Cancer Staging   Staging form: Colon and Rectum, AJCC 8th Edition - Clinical: Stage IIIA (cT1, cN1b, cM0) - Signed by Autumn Millman, MD on 05/03/2024 Histologic grade (G): G1 Histologic grading system: 4 grade system   05/23/2024 -  Chemotherapy   Patient is on Treatment Plan : COLORECTAL FOLFOX q14d x 3 months         REVIEW OF SYSTEMS:   Review of Systems - Oncology  All other pertinent systems were reviewed with the patient and are negative.  ALLERGIES: He has no known allergies.  MEDICATIONS:  Current Outpatient Medications  Medication Sig Dispense Refill   atorvastatin  (LIPITOR) 20 MG tablet Take 20 mg by mouth daily.     Blood Glucose Monitoring Suppl (TRUE METRIX AIR GLUCOSE METER) DEVI Check CBG BID 1 each 11   flecainide  (TAMBOCOR ) 100 MG tablet TAKE 1 TABLET BY MOUTH EVERY 12 HOURS 30 tablet 0   lidocaine -prilocaine  (EMLA ) cream Apply to affected area once 30 g 3   metFORMIN  (GLUCOPHAGE ) 500 MG tablet Take 500-1,000 mg by mouth See admin instructions. Take 1000 mg in the morning and 500 mg in the evening     metoprolol  succinate (TOPROL -XL) 50 MG 24 hr tablet Take 1 tablet (50 mg total) by mouth 2 (two) times daily. 30 tablet 0   pantoprazole  (PROTONIX ) 40 MG tablet Take 1 tablet (40 mg total) by mouth daily. 30 tablet 5   traMADol  (ULTRAM ) 50 MG tablet Take 1 tablet (50 mg total) by mouth every 6 (six) hours as needed for moderate pain (pain score 4-6) or severe pain (pain score 7-10). 30 tablet 0   colchicine  0.6 MG tablet Take 1 tablet (0.6 mg total) by mouth as needed (gout). 30 tablet 3   dexamethasone  (DECADRON ) 4 MG tablet Take 2 tablets (8 mg total) by mouth daily. Start the day after chemotherapy for 2 days. Take with food. (Patient not taking: Reported on 07/18/2024) 30 tablet 1   megestrol  (MEGACE  ES) 625 MG/5ML suspension Take 5 mLs (625 mg total) by mouth daily. 150 mL 3   ondansetron  (ZOFRAN ) 8  MG tablet Take 1 tablet (8 mg total) by mouth every 8 (eight) hours as needed for nausea or vomiting. Start on the third day after chemotherapy. (Patient not taking: Reported on 07/18/2024) 30 tablet 1   prochlorperazine  (COMPAZINE ) 10 MG tablet Take 1 tablet (10 mg total) by mouth every 6 (six) hours as needed for nausea or vomiting. (Patient not taking: Reported on 07/18/2024) 30 tablet 1   tadalafil (CIALIS) 20 MG tablet Take 20 mg by mouth daily as needed for erectile dysfunction. (Patient not taking: Reported on 07/18/2024)     No current facility-administered medications for this visit.   Facility-Administered Medications Ordered in Other Visits  Medication Dose Route Frequency Provider Last Rate Last Admin   dextrose  5 % solution   Intravenous Continuous Cattaleya Wien, MD 10 mL/hr at 07/18/24 1028 New Bag at 07/18/24 1028   fluorouracil  (ADRUCIL ) 5,850 mg in sodium chloride  0.9 % 133 mL chemo infusion  2,400 mg/m2 (Treatment Plan Recorded) Intravenous 1 day or 1 dose Wilbert Hayashi, MD   5,850 mg at 07/18/24 1336     VITALS:   Blood pressure (!) 149/73, pulse 70, temperature (!) 97.3 F (36.3  C), temperature source Temporal, resp. rate 18, weight 239 lb 6 oz (108.6 kg), SpO2 99%.  Wt Readings from Last 3 Encounters:  07/18/24 239 lb 6 oz (108.6 kg)  07/05/24 240 lb (108.9 kg)  06/20/24 249 lb (112.9 kg)    Body mass index is 32.47 kg/m.   Onc Performance Status - 07/18/24 0937       ECOG Perf Status   ECOG Perf Status Restricted in physically strenuous activity but ambulatory and able to carry out work of a light or sedentary nature, e.g., light house work, office work      KPS SCALE   KPS % SCORE Normal activity with effort, some s/s of disease            PHYSICAL EXAM:   Physical Exam Constitutional:      General: He is not in acute distress.    Appearance: Normal appearance.  HENT:     Head: Normocephalic and atraumatic.  Eyes:     Conjunctiva/sclera:  Conjunctivae normal.  Cardiovascular:     Rate and Rhythm: Normal rate and regular rhythm.     Heart sounds: Murmur heard.  Pulmonary:     Effort: Pulmonary effort is normal. No respiratory distress.  Chest:     Comments: Port-A-Cath in place without any signs of infection Abdominal:     General: There is no distension.     Comments: Scars from recent surgery have healed well  Neurological:     General: No focal deficit present.     Mental Status: He is alert and oriented to person, place, and time.  Psychiatric:        Mood and Affect: Mood normal.        Behavior: Behavior normal.      LABORATORY DATA:   I have reviewed the data as listed.  Results for orders placed or performed in visit on 07/18/24  CMP (Cancer Center only)  Result Value Ref Range   Sodium 141 135 - 145 mmol/L   Potassium 3.4 (L) 3.5 - 5.1 mmol/L   Chloride 102 98 - 111 mmol/L   CO2 29 22 - 32 mmol/L   Glucose, Bld 153 (H) 70 - 99 mg/dL   BUN 8 8 - 23 mg/dL   Creatinine 9.10 9.38 - 1.24 mg/dL   Calcium  8.8 (L) 8.9 - 10.3 mg/dL   Total Protein 7.0 6.5 - 8.1 g/dL   Albumin  3.6 3.5 - 5.0 g/dL   AST 31 15 - 41 U/L   ALT 20 0 - 44 U/L   Alkaline Phosphatase 146 (H) 38 - 126 U/L   Total Bilirubin 0.5 0.0 - 1.2 mg/dL   GFR, Estimated >39 >39 mL/min   Anion gap 10 5 - 15  CBC with Differential (Cancer Center Only)  Result Value Ref Range   WBC Count 6.8 4.0 - 10.5 K/uL   RBC 4.82 4.22 - 5.81 MIL/uL   Hemoglobin 13.7 13.0 - 17.0 g/dL   HCT 59.2 60.9 - 47.9 %   MCV 84.4 80.0 - 100.0 fL   MCH 28.4 26.0 - 34.0 pg   MCHC 33.7 30.0 - 36.0 g/dL   RDW 84.1 (H) 88.4 - 84.4 %   Platelet Count 191 150 - 400 K/uL   nRBC 0.3 (H) 0.0 - 0.2 %   Neutrophils Relative % 67 %   Neutro Abs 4.6 1.7 - 7.7 K/uL   Lymphocytes Relative 21 %   Lymphs Abs 1.4 0.7 - 4.0 K/uL  Monocytes Relative 8 %   Monocytes Absolute 0.5 0.1 - 1.0 K/uL   Eosinophils Relative 1 %   Eosinophils Absolute 0.1 0.0 - 0.5 K/uL   Basophils  Relative 1 %   Basophils Absolute 0.1 0.0 - 0.1 K/uL   Immature Granulocytes 2 %   Abs Immature Granulocytes 0.11 (H) 0.00 - 0.07 K/uL      RADIOGRAPHIC STUDIES:  I have personally reviewed the radiological images as listed and agree with the findings in the report.  IR IMAGING GUIDED PORT INSERTION CLINICAL DATA:  Adenocarcinoma of the cecum.  EXAM: Chest port catheter placement  TECHNIQUE: Procedure performed using fluoroscopy and ultrasound  CONTRAST:  None  RADIOPHARMACEUTICALS:  None  FLUOROSCOPY: 0.2 minutes 2.1 mGy  COMPARISON:  None  FINDINGS: The patient was placed in supine position on the IR gantry and the right upper chest and neck were prepped and draped in the usual sterile fashion. The nurse administered intravenous fentanyl  and Versed  under my supervision and the nurse had no other injuries other than monitoring the patient and administering medications. I was present for the entire duration of procedure. 3 mg intravenous Versed , 100 mcg intravenous fentanyl , and 25 mg intravenous Benadryl  were administered for a total sedation time of 16 minutes.  Ultrasound guidance was used to investigate the right internal jugular vein which was anechoic and compressible indicating patency. The needle was then advanced from a scan negative through the soft tissue into the right internal jugular vein under ultrasound guidance. A final image was obtained and stored in the patient's permanent medical record.  Access was then exchanged over a guidewire which was advanced under fluoroscopic guidance. The needle was removed and replaced with a micropuncture sheath. Approximately 2 inches below the clavicle the port pocket was created with a subsequent incision. The catheter was then tunneled from the port pocket to the venotomy site overlying the right internal jugular vein. Access was then exchanged over an 035 guidewire for peel-away sheath which was advanced over  the guidewire under fluoroscopic guidance. The catheter was then advanced through the peel-away sheath to the sinoatrial junction. Sheath was removed. The catheter was then cut at the port pocket and connected to chest port. The chest port was tested for function and finally function well. The chest port was then flushed with heparin  and a port pocket was closed with 4-0 suture. Final image was obtained demonstrating satisfactory position of chest port. The final count of all materials was satisfactory.  IMPRESSION: 1. Satisfactory placement of right internal jugular vein single-lumen chest port. Catheter tip is at the cavoatrial junction.  2.  Okay to use and power inject chest port.  Electronically Signed   By: Cordella Banner   On: 05/16/2024 16:31    CODE STATUS:  Code Status History     Date Active Date Inactive Code Status Order ID Comments User Context   05/16/2024 1451 05/17/2024 0521 Full Code [id number]  Banner Cordella LABOR, MD HOV   04/12/2024 1523 04/13/2024 1633 Full Code [id number]  Debby Hila, MD Inpatient   09/29/2021 1048 09/29/2021 2301 Full Code [id number]  Waddell Danelle ORN, MD Inpatient   08/02/2021 1122 08/02/2021 2215 Full Code [id number]  Waddell Danelle ORN, MD Inpatient   09/30/2020 1930 10/01/2020 1719 Full Code [id number]  Cheryle Debby LABOR, MD Inpatient   04/03/2020 2150 04/08/2020 2149 Full Code [id number]  Shona Terry SAILOR, DO Inpatient    Questions for Most Recent Historical Code Status (Order [id number])  Question Answer   By: Consent: discussion documented in EHR            Orders Placed This Encounter  Procedures   CT CHEST ABDOMEN PELVIS W CONTRAST    Standing Status:   Future    Expected Date:   08/09/2024    Expiration Date:   07/18/2025    If indicated for the ordered procedure, I authorize the administration of contrast media per Radiology protocol:   Yes    Does the patient have a contrast media/X-ray dye allergy?:   No    Preferred imaging  location?:   [facility]    If indicated for the ordered procedure, I authorize the administration of oral contrast media per Radiology protocol:   Yes   Guardant Reveal    Standing Status:   Future    Expected Date:   08/01/2024    Expiration Date:   08/01/2025   CBC with Differential (Cancer Center Only)    Standing Status:   Future    Expected Date:   08/15/2024    Expiration Date:   08/15/2025   CMP (Cancer Center only)    Standing Status:   Future    Expected Date:   08/15/2024    Expiration Date:   08/15/2025   CBC with Differential (Cancer Center Only)    Standing Status:   Future    Expected Date:   08/29/2024    Expiration Date:   08/29/2025   CMP (Cancer Center only)    Standing Status:   Future    Expected Date:   08/29/2024    Expiration Date:   08/29/2025     Future Appointments  Date Time Provider Department Center  07/20/2024  1:45 PM [facility] FLUSH [facility] None  08/01/2024  9:00 AM [facility] FLUSH [facility] None  08/01/2024  9:15 AM Devyon Keator, MD [facility] None  08/01/2024 10:00 AM [facility] INFUSION [facility] None  08/03/2024  1:30 PM [facility] FLUSH [facility] None     This document was completed utilizing speech recognition software. Grammatical errors, random word insertions, pronoun errors, and incomplete sentences are an occasional consequence of this system due to software limitations, ambient noise, and hardware issues. Any formal questions or concerns about the content, text or information contained within the body of this dictation should be directly addressed to the provider for clarification.   "

## 2024-07-18 NOTE — Patient Instructions (Signed)
 [facility] - A DEPT OF [facility]  Discharge Instructions: Thank you for choosing [facility] to provide your oncology and hematology care.   If you have a lab appointment with the Cancer Center, please go directly to the Cancer Center and check in at the registration area.   Wear comfortable clothing and clothing appropriate for easy access to any Portacath or PICC line.   We strive to give you quality time with your provider. You may need to reschedule your appointment if you arrive late (15 or more minutes).  Arriving late affects you and other patients whose appointments are after yours.  Also, if you miss three or more appointments without notifying the office, you may be dismissed from the clinic at the provider's discretion.      For prescription refill requests, have your pharmacy contact our office and allow 72 hours for refills to be completed.    Today you received the following chemotherapy and/or immunotherapy agents:Oxaliplatin  (Eloxatin ), Leucovorin , & Fluorouracil  (Adrucil )    To help prevent nausea and vomiting after your treatment, we encourage you to take your nausea medication as directed.  BELOW ARE SYMPTOMS THAT SHOULD BE REPORTED IMMEDIATELY: *FEVER GREATER THAN 100.4 F (38 C) OR HIGHER *CHILLS OR SWEATING *NAUSEA AND VOMITING THAT IS NOT CONTROLLED WITH YOUR NAUSEA MEDICATION *UNUSUAL SHORTNESS OF BREATH *UNUSUAL BRUISING OR BLEEDING *URINARY PROBLEMS (pain or burning when urinating, or frequent urination) *BOWEL PROBLEMS (unusual diarrhea, constipation, pain near the anus) TENDERNESS IN MOUTH AND THROAT WITH OR WITHOUT PRESENCE OF ULCERS (sore throat, sores in mouth, or a toothache) UNUSUAL RASH, SWELLING OR PAIN  UNUSUAL VAGINAL DISCHARGE OR ITCHING   Items with * indicate a potential emergency and should be followed up as soon as possible or go to the Emergency Department if any problems should occur.  Please  show the CHEMOTHERAPY ALERT CARD or IMMUNOTHERAPY ALERT CARD at check-in to the Emergency Department and triage nurse.  Should you have questions after your visit or need to cancel or reschedule your appointment, please contact [facility] - A DEPT OF [facility]  Dept: [phone]  and follow the prompts.  Office hours are 8:00 a.m. to 4:30 p.m. Monday - Friday. Please note that voicemails left after 4:00 p.m. may not be returned until the following business day.  We are closed weekends and major holidays. You have access to a nurse at all times for urgent questions. Please call the main number to the clinic Dept: [phone] and follow the prompts.   For any non-urgent questions, you may also contact your provider using MyChart. We now offer e-Visits for anyone 25 and older to request care online for non-urgent symptoms. For details visit [email or web address].   Also download the MyChart app! Go to the app store, search MyChart, open the app, select [place], and log in with your MyChart username and password.

## 2024-07-18 NOTE — Telephone Encounter (Signed)
 PA request has been Received. New Encounter has been or will be created for follow up. For additional info see Pharmacy Prior Auth telephone encounter from 07/18/24.

## 2024-07-18 NOTE — Assessment & Plan Note (Addendum)
 Please review oncology history for additional details and timeline of events.  After obtaining cardiac clearance, Dr. Debby performed robotic right colectomy on 04/12/2024.  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  Submucosal invasion was not identified.  Metastatic adenocarcinoma was noted in 2 out of 36 lymph nodes.  Margins were negative for carcinoma or dysplasia.  No LVI, PNI. pTis,pN1b,cM0, Stage III A disease.  MMR proficient.  Staging CT chest on 05/02/2024 showed no evidence of intrathoracic metastatic disease.  Previously CT abdomen and pelvis on 01/29/2024 showed no evidence of metastatic disease.  Stage III colon cancer located in the cecum, status post resection with negative margins.   Chemotherapy is recommended to prevent recurrence due to the risk of microscopic disease. Two chemotherapy options were discussed: FOLFOX regimen with a pump every two weeks or CAPEOX regimen with oral pills every three weeks. The FOLFOX regimen is preferred initially, with the option to switch to CAPEOX if needed due to cardiac history. Common side effects of chemotherapy include nausea, vomiting, fatigue, low blood counts, tingling, numbness, cold sensitivity, and diarrhea. The goal of chemotherapy is to prevent recurrence and aim for a complete cure.  - We did proceed with guardant reveal test on 05/03/2024 and it showed no evidence of circulating tumor DNA.  We also checked Guardant 360 to look for any actionable mutations.  In case of PIK3CA mutation, we will start him on aspirin 100 mg daily.  Plan made to proceed with adjuvant systemic chemotherapy with FOLFOX.  Started from 05/23/2024.  He has been tolerating chemotherapy reasonably well except for mild, self-limited nausea, controlled heartburn, and transient cold sensitivity and mild leukopenia.   Due for cycle 5 of chemotherapy today.  Labs reveal no dose-limiting toxicities.  Platelet count is normal  now and LFTs have normalized except for mildly elevated alkaline phosphatase.  Will proceed with cycle 5 as scheduled.  Will proceed with Neulasta  on the day of 5-FU pump removal with current cycle and as needed going forward.  - Schedule colonoscopy one year post-surgery-due in October 2026.  - Monitor for cardiac arrhythmias due to chemotherapy.  - Advise on dietary modifications to avoid greasy and spicy foods.  - Limit alcohol intake to two beers, not more than 1 day a week, and avoid around chemotherapy days.  RTC in 2 weeks for labs, office visit and cycle 6 of chemotherapy.  Restaging CT scan will be obtained after that.  Request placed today.  Will also obtain Guardant reveal for ct-DNA analysis on return visit.

## 2024-07-19 ENCOUNTER — Telehealth (HOSPITAL_COMMUNITY): Payer: Self-pay

## 2024-07-19 ENCOUNTER — Other Ambulatory Visit (HOSPITAL_COMMUNITY): Payer: Self-pay

## 2024-07-19 ENCOUNTER — Other Ambulatory Visit: Payer: Self-pay | Admitting: Oncology

## 2024-07-19 MED ORDER — MIRTAZAPINE 15 MG PO TABS: 15.0000 mg | ORAL_TABLET | Freq: Every day | ORAL | 3 refills | Status: AC

## 2024-07-19 NOTE — Telephone Encounter (Signed)
 error

## 2024-07-19 NOTE — Telephone Encounter (Signed)
 Pharmacy Patient Advocate Encounter   Received notification from Pt Calls Messages that prior authorization for Megestrol  Acetate 625MG /5ML suspension  is required/requested.   Insurance verification completed.   The patient is insured through [employer].   Per test claim: PA required; PA submitted to above mentioned insurance via Latent Key/confirmation #/EOC [id number] Status is pending

## 2024-07-19 NOTE — Telephone Encounter (Deleted)
 Medication: Megestrol  625 mg/5 ml suspension Able to fill? No Prior authorization required? Yes Co-pay before assistance: n/a

## 2024-07-20 ENCOUNTER — Inpatient Hospital Stay: Payer: Self-pay

## 2024-07-20 VITALS — BP 134/70 | HR 78 | Temp 97.0°F | Resp 20 | Wt 237.8 lb

## 2024-07-20 DIAGNOSIS — C180 Malignant neoplasm of cecum: Secondary | ICD-10-CM

## 2024-07-20 DIAGNOSIS — Z5111 Encounter for antineoplastic chemotherapy: Secondary | ICD-10-CM | POA: Diagnosis not present

## 2024-07-20 MED ORDER — PEGFILGRASTIM INJECTION 6 MG/0.6ML ~~LOC~~
6.0000 mg | PREFILLED_SYRINGE | Freq: Once | SUBCUTANEOUS | Status: AC
  Administered 2024-07-20: 6 mg via SUBCUTANEOUS

## 2024-07-23 ENCOUNTER — Encounter: Payer: Self-pay | Admitting: Oncology

## 2024-07-23 NOTE — Progress Notes (Signed)
 Fulphila  has been substituted for Neulasta  d/t insurance preference per shara team.  Harrison Paulson, PharmD, MBA

## 2024-07-25 NOTE — Telephone Encounter (Signed)
 Pharmacy Patient Advocate Encounter  Received notification from [facility] that Prior Authorization for Megestrol  Acetate 625MG /5ML suspension has been DENIED.  Full denial letter will be uploaded to the media tab. See denial reason below.   PA #/Case ID/Reference #: [id number]

## 2024-08-01 ENCOUNTER — Inpatient Hospital Stay: Payer: Self-pay

## 2024-08-01 ENCOUNTER — Other Ambulatory Visit: Payer: Self-pay

## 2024-08-01 ENCOUNTER — Inpatient Hospital Stay: Payer: Self-pay | Admitting: Dietician

## 2024-08-01 ENCOUNTER — Inpatient Hospital Stay: Payer: Self-pay | Admitting: Oncology

## 2024-08-01 VITALS — BP 147/74 | HR 78 | Temp 97.9°F | Resp 18 | Wt 229.9 lb

## 2024-08-01 VITALS — BP 153/85 | HR 65

## 2024-08-01 DIAGNOSIS — C180 Malignant neoplasm of cecum: Secondary | ICD-10-CM

## 2024-08-01 DIAGNOSIS — Z5111 Encounter for antineoplastic chemotherapy: Secondary | ICD-10-CM | POA: Diagnosis not present

## 2024-08-01 LAB — CBC WITH DIFFERENTIAL (CANCER CENTER ONLY)
Abs Immature Granulocytes: 0.05 K/uL (ref 0.00–0.07)
Basophils Absolute: 0.1 K/uL (ref 0.0–0.1)
Basophils Relative: 1 %
Eosinophils Absolute: 0.1 K/uL (ref 0.0–0.5)
Eosinophils Relative: 1 %
HCT: 44.3 % (ref 39.0–52.0)
Hemoglobin: 14.4 g/dL (ref 13.0–17.0)
Immature Granulocytes: 1 %
Lymphocytes Relative: 23 %
Lymphs Abs: 1.7 K/uL (ref 0.7–4.0)
MCH: 27.9 pg (ref 26.0–34.0)
MCHC: 32.5 g/dL (ref 30.0–36.0)
MCV: 85.7 fL (ref 80.0–100.0)
Monocytes Absolute: 0.8 K/uL (ref 0.1–1.0)
Monocytes Relative: 10 %
Neutro Abs: 4.9 K/uL (ref 1.7–7.7)
Neutrophils Relative %: 64 %
Platelet Count: 159 K/uL (ref 150–400)
RBC: 5.17 MIL/uL (ref 4.22–5.81)
RDW: 16.9 % — ABNORMAL HIGH (ref 11.5–15.5)
WBC Count: 7.6 K/uL (ref 4.0–10.5)
nRBC: 0 % (ref 0.0–0.2)

## 2024-08-01 LAB — CMP (CANCER CENTER ONLY)
ALT: 21 U/L (ref 0–44)
AST: 35 U/L (ref 15–41)
Albumin: 3.7 g/dL (ref 3.5–5.0)
Alkaline Phosphatase: 174 U/L — ABNORMAL HIGH (ref 38–126)
Anion gap: 13 (ref 5–15)
BUN: 7 mg/dL — ABNORMAL LOW (ref 8–23)
CO2: 24 mmol/L (ref 22–32)
Calcium: 9 mg/dL (ref 8.9–10.3)
Chloride: 105 mmol/L (ref 98–111)
Creatinine: 1.07 mg/dL (ref 0.61–1.24)
GFR, Estimated: >60 mL/min
Glucose, Bld: 196 mg/dL — ABNORMAL HIGH (ref 70–99)
Potassium: 3.5 mmol/L (ref 3.5–5.1)
Sodium: 142 mmol/L (ref 135–145)
Total Bilirubin: 0.6 mg/dL (ref 0.0–1.2)
Total Protein: 7.1 g/dL (ref 6.5–8.1)

## 2024-08-01 MED ORDER — OXALIPLATIN CHEMO INJECTION 100 MG/20ML
85.0000 mg/m2 | Freq: Once | INTRAVENOUS | Status: AC
  Administered 2024-08-01: 200 mg via INTRAVENOUS
  Filled 2024-08-01: qty 40

## 2024-08-01 MED ORDER — DEXTROSE 5 % IV SOLN: INTRAVENOUS | Status: DC

## 2024-08-01 MED ORDER — LEUCOVORIN CALCIUM INJECTION 350 MG
400.0000 mg/m2 | Freq: Once | INTRAVENOUS | Status: AC
  Administered 2024-08-01: 972 mg via INTRAVENOUS
  Filled 2024-08-01: qty 48.6

## 2024-08-01 MED ORDER — FLUOROURACIL CHEMO INJECTION 2.5 GM/50ML
400.0000 mg/m2 | Freq: Once | INTRAVENOUS | Status: AC
  Administered 2024-08-01: 1000 mg via INTRAVENOUS
  Filled 2024-08-01: qty 20

## 2024-08-01 MED ORDER — SODIUM CHLORIDE 0.9 % IV SOLN
2400.0000 mg/m2 | INTRAVENOUS | Status: DC
  Administered 2024-08-01: 5850 mg via INTRAVENOUS
  Filled 2024-08-01: qty 117

## 2024-08-01 MED ORDER — DEXAMETHASONE SOD PHOSPHATE PF 10 MG/ML IJ SOLN
10.0000 mg | Freq: Once | INTRAMUSCULAR | Status: AC
  Administered 2024-08-01: 10 mg via INTRAVENOUS
  Filled 2024-08-01: qty 1

## 2024-08-01 MED ORDER — PALONOSETRON HCL INJECTION 0.25 MG/5ML
0.2500 mg | Freq: Once | INTRAVENOUS | Status: AC
  Administered 2024-08-01: 0.25 mg via INTRAVENOUS
  Filled 2024-08-01: qty 5

## 2024-08-01 MED ORDER — DIPHENOXYLATE-ATROPINE 2.5-0.025 MG PO TABS: 1.0000 | ORAL_TABLET | Freq: Four times a day (QID) | ORAL | 1 refills | Status: AC | PRN

## 2024-08-01 NOTE — Patient Instructions (Signed)
 [facility] - A DEPT OF [facility]  Discharge Instructions: Thank you for choosing [facility] to provide your oncology and hematology care.   If you have a lab appointment with the Cancer Center, please go directly to the Cancer Center and check in at the registration area.   Wear comfortable clothing and clothing appropriate for easy access to any Portacath or PICC line.   We strive to give you quality time with your provider. You may need to reschedule your appointment if you arrive late (15 or more minutes).  Arriving late affects you and other patients whose appointments are after yours.  Also, if you miss three or more appointments without notifying the office, you may be dismissed from the clinic at the provider's discretion.      For prescription refill requests, have your pharmacy contact our office and allow 72 hours for refills to be completed.    Today you received the following chemotherapy and/or immunotherapy agents: Oxaliplatin, Leucovorin, 5FU      To help prevent nausea and vomiting after your treatment, we encourage you to take your nausea medication as directed.  BELOW ARE SYMPTOMS THAT SHOULD BE REPORTED IMMEDIATELY: *FEVER GREATER THAN 100.4 F (38 C) OR HIGHER *CHILLS OR SWEATING *NAUSEA AND VOMITING THAT IS NOT CONTROLLED WITH YOUR NAUSEA MEDICATION *UNUSUAL SHORTNESS OF BREATH *UNUSUAL BRUISING OR BLEEDING *URINARY PROBLEMS (pain or burning when urinating, or frequent urination) *BOWEL PROBLEMS (unusual diarrhea, constipation, pain near the anus) TENDERNESS IN MOUTH AND THROAT WITH OR WITHOUT PRESENCE OF ULCERS (sore throat, sores in mouth, or a toothache) UNUSUAL RASH, SWELLING OR PAIN  UNUSUAL VAGINAL DISCHARGE OR ITCHING   Items with * indicate a potential emergency and should be followed up as soon as possible or go to the Emergency Department if any problems should occur.  Please show the CHEMOTHERAPY ALERT  CARD or IMMUNOTHERAPY ALERT CARD at check-in to the Emergency Department and triage nurse.  Should you have questions after your visit or need to cancel or reschedule your appointment, please contact [facility] - A DEPT OF [facility]  Dept: [phone]  and follow the prompts.  Office hours are 8:00 a.m. to 4:30 p.m. Monday - Friday. Please note that voicemails left after 4:00 p.m. may not be returned until the following business day.  We are closed weekends and major holidays. You have access to a nurse at all times for urgent questions. Please call the main number to the clinic Dept: [phone] and follow the prompts.   For any non-urgent questions, you may also contact your provider using MyChart. We now offer e-Visits for anyone 47 and older to request care online for non-urgent symptoms. For details visit [email or web address].   Also download the MyChart app! Go to the app store, search "MyChart", open the app, select [place], and log in with your MyChart username and password.

## 2024-08-01 NOTE — Progress Notes (Signed)
 Patient reported feeling bad about 30 minutes into his Infusion appointment, saying he felt like he was going to drop out, felt sudden extreme sleepiness, and blurry vision. VSS (BP 153/85, pulse 65, SpO2 100%), CBG 218. Patient reported feeling back to baseline after a few minutes of sitting upright.  Dr. Autumn notified, assessed patient at chairside, gave OK to proceed with Tx.

## 2024-08-01 NOTE — Progress Notes (Signed)
 "  [facility]  ONCOLOGY CLINIC PROGRESS NOTE   Patient Care Team: Barbra Odor, NP as PCP - General (Nurse Practitioner) Waddell Danelle ORN, MD as PCP - Electrophysiology (Cardiology) Raford Riggs, MD as PCP - Cardiology (Cardiology) Milissa Clack, MD as Referring Physician (Physical Medicine and Rehabilitation) Debby Hila, MD as Consulting Physician (General Surgery)  PATIENT NAME: Caleb [name]   MR#: [id number] DOB: 1960-10-16  Date of visit: 08/01/2024   ASSESSMENT & PLAN:   Caleb [name] is a 64 y.o.  gentleman with a past medical history of hypertension, diabetes mellitus type 2, dyslipidemia, atrial flutter status post ablation, was referred to our clinic for newly diagnosed adenocarcinoma of the cecum, stage III, MMR proficient, status post hemicolectomy on 04/12/2024.   Adenocarcinoma of cecum [facility]) Please review oncology history for additional details and timeline of events.  After obtaining cardiac clearance, Dr. Debby performed robotic right colectomy on 04/12/2024.  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  Submucosal invasion was not identified.  Metastatic adenocarcinoma was noted in 2 out of 36 lymph nodes.  Margins were negative for carcinoma or dysplasia.  No LVI, PNI. pTis,pN1b,cM0, Stage III A disease.  MMR proficient.  Staging CT chest on 05/02/2024 showed no evidence of intrathoracic metastatic disease.  Previously CT abdomen and pelvis on 01/29/2024 showed no evidence of metastatic disease.  Stage III colon cancer located in the cecum, status post resection with negative margins.   Chemotherapy is recommended to prevent recurrence due to the risk of microscopic disease. Two chemotherapy options were discussed: FOLFOX regimen with a pump every two weeks or CAPEOX regimen with oral pills every three weeks. The FOLFOX regimen is preferred initially, with the option to switch to CAPEOX if needed  due to cardiac history. Common side effects of chemotherapy include nausea, vomiting, fatigue, low blood counts, tingling, numbness, cold sensitivity, and diarrhea. The goal of chemotherapy is to prevent recurrence and aim for a complete cure.  - We did proceed with guardant reveal test on 05/03/2024 and it showed no evidence of circulating tumor DNA.  We also checked Guardant 360 to look for any actionable mutations.  In case of PIK3CA mutation, we will start him on aspirin 100 mg daily.  Plan made to proceed with adjuvant systemic chemotherapy with FOLFOX.  Started from 05/23/2024.  He has been tolerating chemotherapy reasonably well except for mild, self-limited nausea, controlled heartburn, and transient cold sensitivity and mild leukopenia.   Due for cycle 6 of chemotherapy today.  Labs reveal no dose-limiting toxicities.  Platelet count is normal now and LFTs have normalized except for mildly elevated alkaline phosphatase.  Will proceed with cycle 6 as scheduled.  Will proceed with Neulasta  on the day of 5-FU pump removal with current cycle and as needed going forward.  - Schedule colonoscopy one year post-surgery-due in October 2026.  - Monitor for cardiac arrhythmias due to chemotherapy.  - Advise on dietary modifications to avoid greasy and spicy foods.  - Limit alcohol intake to two beers, not more than 1 day a week, and avoid around chemotherapy days.  RTC in 2 weeks for labs, office visit and cycle 7 of chemotherapy.  Restaging CT scan will be obtained prior to return visit.  We obtained guardant reveal for ct-DNA analysis today, 08/01/2024.  Will follow-up on the results when available.  Chemotherapy-induced diarrhea He experienced a prolonged episode of diarrhea after the last chemotherapy cycle, more severe than prior episodes, with associated  abdominal cramping triggered by oral intake. The episode resolved without medication. Given the risk of chemotherapy-induced diarrhea,  particularly from the pump medication, anticipatory guidance was provided. - Provided instructions for loperamide: two tablets after the first loose stool, then one tablet after each subsequent loose stool, not to exceed eight tablets per day. - Sent prescription for diphenoxylate -atropine  to his pharmacy for use if loperamide is insufficient or if abdominal cramping recurs. - Advised discontinuation of loperamide if diphenoxylate -atropine  is initiated.  Neutropenia managed with growth factor injections Neutropenia has been well-managed with growth factor injections, maintaining his white blood cell count at a safe level. No bone pain or other adverse effects from the injections were reported. - Continued growth factor injections as previously prescribed due to prior episodes of neutropenia without injections.  Chemotherapy-induced peripheral neuropathy Developed cold sensitivity in fingers and mouth, a common side effect of chemotherapy. Symptoms may persist during chemotherapy breaks but are expected to resolve post-treatment. Tingling and numbness may limit chemotherapy dosage. - Continue to monitor neuropathy symptoms and adjust chemotherapy dosage if necessary  Gastroesophageal reflux disease Symptoms improved with pantoprazole .  - Continue pantoprazole   Atrial flutter, status post ablation, on flecainide  Atrial flutter, status post two ablations with the second being successful. Currently on flecainide . Chemotherapy may increase the risk of cardiac arrhythmias, particularly with the FOLFOX regimen. - Monitor cardiac status closely during chemotherapy. - Consider switching to CAPOX regimen if cardiac issues arise.    I reviewed lab results and outside records for this visit and discussed relevant results with the patient. Diagnosis, plan of care and treatment options were also discussed in detail with the patient. Opportunity provided to ask questions and answers provided to his apparent  satisfaction. Provided instructions to call our clinic with any problems, questions or concerns prior to return visit. I recommended to continue follow-up with PCP and sub-specialists. He verbalized understanding and agreed with the plan.   NCCN guidelines have been consulted in the planning of this patients care.  I spent a total of 42 minutes during this encounter with the patient including review of chart and various tests results, discussions about plan of care and coordination of care plan.   Caleb Patten, MD  08/01/2024  [facility] - A DEPT OF [facility] [address] [place] [address] Dept: [phone] Dept Fax: [phone]    CHIEF COMPLAINT/ REASON FOR VISIT:   Adenocarcinoma of the cecum, stage III, MMR proficient, status post hemicolectomy on 04/12/2024.   Current Treatment: Started adjuvant systemic chemotherapy with FOLFOX from 05/23/2024.  INTERVAL HISTORY:    Discussed the use of AI scribe software for clinical note transcription with the patient, who gave verbal consent to proceed.  History of Present Illness  EFSTATHIOS SAWIN is a 64 year old male with cecal adenocarcinoma, status post resection, currently receiving adjuvant chemotherapy, who presents for oncology follow-up to monitor treatment response and side effects.  He is undergoing adjuvant chemotherapy following surgical resection for cecal adenocarcinoma. Recent circulating tumor DNA testing performed in October did not detect tumor DNA.  Following his most recent chemotherapy cycle, he experienced increased diarrhea, which he initially attributed to colchicine  taken for a gout flare. This episode was notable for persistent diarrhea and severe abdominal cramping, both of which were triggered by oral intake and required immediate trips to the restroom. These symptoms resolved yesterday without the use of antidiarrheal medications, and  he currently denies diarrhea or abdominal  cramps.  He is not currently taking colchicine  and does not require it at this time. He describes mild pain in his feet, particularly around the ankles and sides, which improves with ambulation. He denies chest pain, shortness of breath, palpitations, or bone pain. He has abstained from alcohol for the past two weeks and has avoided acetaminophen  as previously advised.  He continues to receive growth factor injections during chemotherapy. He denies bone pain.   I have reviewed the past medical history, past surgical history, social history and family history with the patient and they are unchanged from previous note.  HISTORY OF PRESENT ILLNESS:   ONCOLOGY HISTORY:   Patient had a screening colonoscopy when he was 64 years old which showed some benign polyps.  He did not have a follow-up colonoscopy after that.   He had mild anemia in June 2025 with hemoglobin of 12.1, MCV 76.  His PCP obtained additional workup including Cologuard test.  Cologuard came back positive.   He had a colonoscopy on 01/22/2024, performed by Dr. Abran.  It showed 12 mm apparently did polyp in the rectum, 4 polyps in the descending colon and transverse colon measuring 3 to 8 mm in size.  In the cecum there was a 4 to 5 cm soft polypoid mass which was friable.  This was biopsied with cold forceps.  In addition there were 4 PET included in semipedunculated polyps measuring between 10 and 25 mm.  This area was tattooed.  Multiple diverticula were noted in the colon.  Internal hemorrhoids.  He had an EGD that day which showed evidence of esophagitis and stricture.  Hiatal hernia was noted.  Question portal hypertensive gastropathy.   Pathology showed superficial fragments of tubulovillous adenoma from the cecal mass.  It was negative for high-grade dysplasia or carcinoma.  Rest of the polyps were either hyperplastic or tubular adenomas without evidence of malignancy or high-grade  dysplasia.   On 01/29/2024, CT abdomen and pelvis was obtained for further evaluation which showed what appeared to be 4.3 cm intraluminal soft tissue density in the ascending colon, highly suspicious for polypoid intraluminal mass.  No evidence of metastatic disease.  Colonic diverticulosis was noted.  Probable hepatic cirrhosis was also noted.   With these findings, patient was referred to Dr. Bernarda Ned, general surgery for further evaluation.  Though the biopsy did not show evidence of malignancy, given the size of the mass and clinical picture, Dr. Ned recommended proceeding with robotic assisted right colectomy.   After obtaining cardiac clearance, Dr. Ned performed robotic right colectomy on 04/12/2024.  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  Submucosal invasion was not identified.  Metastatic adenocarcinoma was noted in 2 out of 36 lymph nodes.  Margins were negative for carcinoma or dysplasia.  No LVI, PNI. pTis,pN1b,cM0, Stage III A disease.  MMR proficient.   Staging CT chest on 05/02/2024 showed no evidence of intrathoracic metastatic disease.   Given lymph node positive adenocarcinoma of the cecum, recommended adjuvant chemotherapy with FOLFOX regimen.  We will watch him closely for any development of cardiac arrhythmias.  Started cycle 1 of FOLFOX from 05/23/2024.  Oncology History  Adenocarcinoma of cecum (HCC)  05/03/2024 Initial Diagnosis   Adenocarcinoma of cecum (HCC)   05/03/2024 Cancer Staging   Staging form: Colon and Rectum, AJCC 8th Edition - Clinical: Stage IIIA (cT1, cN1b, cM0) - Signed by Autumn Millman, MD on 05/03/2024 Histologic grade (G): G1 Histologic grading system: 4 grade system  05/23/2024 -  Chemotherapy   Patient is on Treatment Plan : COLORECTAL FOLFOX q14d x 3 months         REVIEW OF SYSTEMS:   Review of Systems - Oncology  All other pertinent systems were reviewed with the patient and are  negative.  ALLERGIES: He has no known allergies.  MEDICATIONS:  Current Outpatient Medications  Medication Sig Dispense Refill   diphenoxylate -atropine  (LOMOTIL ) 2.5-0.025 MG tablet Take 1 tablet by mouth 4 (four) times daily as needed for diarrhea or loose stools. 40 tablet 1   atorvastatin  (LIPITOR) 20 MG tablet Take 20 mg by mouth daily.     Blood Glucose Monitoring Suppl (TRUE METRIX AIR GLUCOSE METER) DEVI Check CBG BID 1 each 11   colchicine  0.6 MG tablet Take 1 tablet (0.6 mg total) by mouth as needed (gout). 30 tablet 3   dexamethasone  (DECADRON ) 4 MG tablet Take 2 tablets (8 mg total) by mouth daily. Start the day after chemotherapy for 2 days. Take with food. (Patient not taking: Reported on 07/18/2024) 30 tablet 1   flecainide  (TAMBOCOR ) 100 MG tablet TAKE 1 TABLET BY MOUTH EVERY 12 HOURS 30 tablet 0   lidocaine -prilocaine  (EMLA ) cream Apply to affected area once 30 g 3   megestrol  (MEGACE  ES) 625 MG/5ML suspension Take 5 mLs (625 mg total) by mouth daily. 150 mL 3   metFORMIN  (GLUCOPHAGE ) 500 MG tablet Take 500-1,000 mg by mouth See admin instructions. Take 1000 mg in the morning and 500 mg in the evening     metoprolol  succinate (TOPROL -XL) 50 MG 24 hr tablet Take 1 tablet (50 mg total) by mouth 2 (two) times daily. 30 tablet 0   mirtazapine  (REMERON ) 15 MG tablet Take 1 tablet (15 mg total) by mouth at bedtime. 30 tablet 3   ondansetron  (ZOFRAN ) 8 MG tablet Take 1 tablet (8 mg total) by mouth every 8 (eight) hours as needed for nausea or vomiting. Start on the third day after chemotherapy. (Patient not taking: Reported on 07/18/2024) 30 tablet 1   pantoprazole  (PROTONIX ) 40 MG tablet Take 1 tablet (40 mg total) by mouth daily. 30 tablet 5   prochlorperazine  (COMPAZINE ) 10 MG tablet Take 1 tablet (10 mg total) by mouth every 6 (six) hours as needed for nausea or vomiting. (Patient not taking: Reported on 07/18/2024) 30 tablet 1   tadalafil (CIALIS) 20 MG tablet Take 20 mg by mouth daily  as needed for erectile dysfunction. (Patient not taking: Reported on 07/18/2024)     traMADol  (ULTRAM ) 50 MG tablet Take 1 tablet (50 mg total) by mouth every 6 (six) hours as needed for moderate pain (pain score 4-6) or severe pain (pain score 7-10). 30 tablet 0   No current facility-administered medications for this visit.     VITALS:   Blood pressure (!) 147/74, pulse 78, temperature 97.9 F (36.6 C), temperature source Temporal, resp. rate 18, weight 229 lb 14.4 oz (104.3 kg), SpO2 100%.  Wt Readings from Last 3 Encounters:  08/01/24 229 lb 14.4 oz (104.3 kg)  07/20/24 237 lb 12 oz (107.8 kg)  07/18/24 239 lb 6 oz (108.6 kg)    Body mass index is 31.18 kg/m.   Onc Performance Status - 08/01/24 1013       ECOG Perf Status   ECOG Perf Status Restricted in physically strenuous activity but ambulatory and able to carry out work of a light or sedentary nature, e.g., light house work, office work      KPS  SCALE   KPS % SCORE Normal activity with effort, some s/s of disease             PHYSICAL EXAM:   Physical Exam Constitutional:      General: He is not in acute distress.    Appearance: Normal appearance.  HENT:     Head: Normocephalic and atraumatic.  Eyes:     Conjunctiva/sclera: Conjunctivae normal.  Cardiovascular:     Rate and Rhythm: Normal rate and regular rhythm.     Heart sounds: Murmur heard.  Pulmonary:     Effort: Pulmonary effort is normal. No respiratory distress.  Chest:     Comments: Port-A-Cath in place without any signs of infection Abdominal:     General: There is no distension.     Comments: Scars from recent surgery have healed well  Neurological:     General: No focal deficit present.     Mental Status: He is alert and oriented to person, place, and time.  Psychiatric:        Mood and Affect: Mood normal.        Behavior: Behavior normal.      LABORATORY DATA:   I have reviewed the data as listed.  Results for orders placed or  performed in visit on 08/01/24  CMP (Cancer Center only)  Result Value Ref Range   Sodium 142 135 - 145 mmol/L   Potassium 3.5 3.5 - 5.1 mmol/L   Chloride 105 98 - 111 mmol/L   CO2 24 22 - 32 mmol/L   Glucose, Bld 196 (H) 70 - 99 mg/dL   BUN 7 (L) 8 - 23 mg/dL   Creatinine 8.92 9.38 - 1.24 mg/dL   Calcium  9.0 8.9 - 10.3 mg/dL   Total Protein 7.1 6.5 - 8.1 g/dL   Albumin  3.7 3.5 - 5.0 g/dL   AST 35 15 - 41 U/L   ALT 21 0 - 44 U/L   Alkaline Phosphatase 174 (H) 38 - 126 U/L   Total Bilirubin 0.6 0.0 - 1.2 mg/dL   GFR, Estimated >39 >39 mL/min   Anion gap 13 5 - 15  CBC with Differential (Cancer Center Only)  Result Value Ref Range   WBC Count 7.6 4.0 - 10.5 K/uL   RBC 5.17 4.22 - 5.81 MIL/uL   Hemoglobin 14.4 13.0 - 17.0 g/dL   HCT 55.6 60.9 - 47.9 %   MCV 85.7 80.0 - 100.0 fL   MCH 27.9 26.0 - 34.0 pg   MCHC 32.5 30.0 - 36.0 g/dL   RDW 83.0 (H) 88.4 - 84.4 %   Platelet Count 159 150 - 400 K/uL   nRBC 0.0 0.0 - 0.2 %   Neutrophils Relative % 64 %   Neutro Abs 4.9 1.7 - 7.7 K/uL   Lymphocytes Relative 23 %   Lymphs Abs 1.7 0.7 - 4.0 K/uL   Monocytes Relative 10 %   Monocytes Absolute 0.8 0.1 - 1.0 K/uL   Eosinophils Relative 1 %   Eosinophils Absolute 0.1 0.0 - 0.5 K/uL   Basophils Relative 1 %   Basophils Absolute 0.1 0.0 - 0.1 K/uL   Immature Granulocytes 1 %   Abs Immature Granulocytes 0.05 0.00 - 0.07 K/uL      RADIOGRAPHIC STUDIES:  I have personally reviewed the radiological images as listed and agree with the findings in the report.  IR IMAGING GUIDED PORT INSERTION CLINICAL DATA:  Adenocarcinoma of the cecum.  EXAM: Chest port catheter placement  TECHNIQUE: Procedure  performed using fluoroscopy and ultrasound  CONTRAST:  None  RADIOPHARMACEUTICALS:  None  FLUOROSCOPY: 0.2 minutes 2.1 mGy  COMPARISON:  None  FINDINGS: The patient was placed in supine position on the IR gantry and the right upper chest and neck were prepped and draped in the  usual sterile fashion. The nurse administered intravenous fentanyl  and Versed  under my supervision and the nurse had no other injuries other than monitoring the patient and administering medications. I was present for the entire duration of procedure. 3 mg intravenous Versed , 100 mcg intravenous fentanyl , and 25 mg intravenous Benadryl  were administered for a total sedation time of 16 minutes.  Ultrasound guidance was used to investigate the right internal jugular vein which was anechoic and compressible indicating patency. The needle was then advanced from a scan negative through the soft tissue into the right internal jugular vein under ultrasound guidance. A final image was obtained and stored in the patient's permanent medical record.  Access was then exchanged over a guidewire which was advanced under fluoroscopic guidance. The needle was removed and replaced with a micropuncture sheath. Approximately 2 inches below the clavicle the port pocket was created with a subsequent incision. The catheter was then tunneled from the port pocket to the venotomy site overlying the right internal jugular vein. Access was then exchanged over an 035 guidewire for peel-away sheath which was advanced over the guidewire under fluoroscopic guidance. The catheter was then advanced through the peel-away sheath to the sinoatrial junction. Sheath was removed. The catheter was then cut at the port pocket and connected to chest port. The chest port was tested for function and finally function well. The chest port was then flushed with heparin  and a port pocket was closed with 4-0 suture. Final image was obtained demonstrating satisfactory position of chest port. The final count of all materials was satisfactory.  IMPRESSION: 1. Satisfactory placement of right internal jugular vein single-lumen chest port. Catheter tip is at the cavoatrial junction.  2.  Okay to use and power inject chest  port.  Electronically Signed   By: Cordella Banner   On: 05/16/2024 16:31    CODE STATUS:  Code Status History     Date Active Date Inactive Code Status Order ID Comments User Context   05/16/2024 1451 05/17/2024 0521 Full Code [id number]  Banner Cordella LABOR, MD HOV   04/12/2024 1523 04/13/2024 1633 Full Code [id number]  Debby Hila, MD Inpatient   09/29/2021 1048 09/29/2021 2301 Full Code [id number]  Waddell Danelle ORN, MD Inpatient   08/02/2021 1122 08/02/2021 2215 Full Code [id number]  Waddell Danelle ORN, MD Inpatient   09/30/2020 1930 10/01/2020 1719 Full Code [id number]  Cheryle Debby LABOR, MD Inpatient   04/03/2020 2150 04/08/2020 2149 Full Code [id number]  Shona Terry SAILOR, DO Inpatient    Questions for Most Recent Historical Code Status (Order [id number])     Question Answer   By: Consent: discussion documented in EHR            No orders of the defined types were placed in this encounter.    Future Appointments  Date Time Provider Department Center  08/01/2024 12:00 PM Ivonne Harlene RAMAN, RD [facility] None  08/03/2024  1:30 PM [facility] None  08/09/2024  9:30 AM [facility]     This document was completed utilizing speech recognition software. Grammatical errors, random word insertions, pronoun errors, and incomplete sentences are an occasional consequence of this system due to software limitations, ambient  noise, and hardware issues. Any formal questions or concerns about the content, text or information contained within the body of this dictation should be directly addressed to the provider for clarification.   "

## 2024-08-01 NOTE — Progress Notes (Signed)
 Nutrition Assessment   Reason for Assessment: Referral - wt loss    ASSESSMENT: 64 year old male with adenocarcinoma of cecum s/p resection. He is receiving Folfox q14d. Patient is under the care of Dr. Autumn  Past medical history includes DM2, HTN, atrial flutter, gout  Met with pt and wife in infusion. He reports appetite has been better the last few days. Thinks appetite stimulant is starting to work. Ate 2 meals yesterday which he has not done in several weeks. Recalls veggie plate for lunch and fish with fries for dinner. Patient had a chicken biscuit this morning. Eats a lot of fruit. Cold sensitivity lasting 10-12 days after treatment. He is drinking gatorade and mellow yellow. Patient does not like the way water  taste. It makes him nauseous. Patient reports gout flare up and GI bug last week. Says he had multiple episodes of diarrhea lasting 6 days. This has resolved.   Nutrition Focused Physical Exam: deferred    Medications: lipitor, colchicine , decadron , lomotil , metformin , toprol , remeron , protonix , zofran , protonix , compazine , tramadol    Labs: glucose 196, BUN 7   Anthropometrics:   Height: 6' Weight: 229 lb 14.4 oz  UBW: 260 lb  BMI: 31.18   NUTRITION DIAGNOSIS: Unintended wt loss related to cancer as evidenced by 3.4% wt loss in the last 12 days. Patient 237 lb 12 oz on 1/10. He has lost 9.1% of body weight in the last 2 months which is severe form time frame. Per chart, pt 252 lb on 11/2   INTERVENTION:  Continue working to increase calories and protein with small frequent meals/snacks Suggested try flavor drops/liquid IV in water  to enhance flavor. Educated on empty calories and dehydration effects of caffeinated soda Educated on sources of protein, recommend protein foods at every meal/snack - snack ideas + list of soft moist high protein foods provided  Provided samples of Ensure, Boost breeze, CIB for pt to try - recommend daily if tolerated  Educated on  strategies for diarrhea - antidiarrheals per MD, samples of Banatrol + handout with tips provided  Contact information    MONITORING, EVALUATION, GOAL: Pt will tolerate increased calories and protein to minimize further wt loss    Next Visit: To be determined

## 2024-08-03 ENCOUNTER — Other Ambulatory Visit: Payer: Self-pay | Admitting: *Deleted

## 2024-08-03 ENCOUNTER — Encounter: Payer: Self-pay | Admitting: Oncology

## 2024-08-03 ENCOUNTER — Inpatient Hospital Stay

## 2024-08-03 VITALS — BP 163/70 | HR 86 | Temp 98.2°F | Resp 14

## 2024-08-03 DIAGNOSIS — Z5111 Encounter for antineoplastic chemotherapy: Secondary | ICD-10-CM | POA: Diagnosis not present

## 2024-08-03 DIAGNOSIS — C180 Malignant neoplasm of cecum: Secondary | ICD-10-CM

## 2024-08-03 MED ORDER — PEGFILGRASTIM-JMDB 6 MG/0.6ML ~~LOC~~ SOSY
6.0000 mg | PREFILLED_SYRINGE | Freq: Once | SUBCUTANEOUS | Status: AC
  Administered 2024-08-03: 6 mg via SUBCUTANEOUS
  Filled 2024-08-03: qty 0.6

## 2024-08-03 NOTE — Assessment & Plan Note (Signed)
 Please review oncology history for additional details and timeline of events.  After obtaining cardiac clearance, Dr. Debby performed robotic right colectomy on 04/12/2024.  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  Submucosal invasion was not identified.  Metastatic adenocarcinoma was noted in 2 out of 36 lymph nodes.  Margins were negative for carcinoma or dysplasia.  No LVI, PNI. pTis,pN1b,cM0, Stage III A disease.  MMR proficient.  Staging CT chest on 05/02/2024 showed no evidence of intrathoracic metastatic disease.  Previously CT abdomen and pelvis on 01/29/2024 showed no evidence of metastatic disease.  Stage III colon cancer located in the cecum, status post resection with negative margins.   Chemotherapy is recommended to prevent recurrence due to the risk of microscopic disease. Two chemotherapy options were discussed: FOLFOX regimen with a pump every two weeks or CAPEOX regimen with oral pills every three weeks. The FOLFOX regimen is preferred initially, with the option to switch to CAPEOX if needed due to cardiac history. Common side effects of chemotherapy include nausea, vomiting, fatigue, low blood counts, tingling, numbness, cold sensitivity, and diarrhea. The goal of chemotherapy is to prevent recurrence and aim for a complete cure.  - We did proceed with guardant reveal test on 05/03/2024 and it showed no evidence of circulating tumor DNA.  We also checked Guardant 360 to look for any actionable mutations.  In case of PIK3CA mutation, we will start him on aspirin 100 mg daily.  Plan made to proceed with adjuvant systemic chemotherapy with FOLFOX.  Started from 05/23/2024.  He has been tolerating chemotherapy reasonably well except for mild, self-limited nausea, controlled heartburn, and transient cold sensitivity and mild leukopenia.   Due for cycle 6 of chemotherapy today.  Labs reveal no dose-limiting toxicities.  Platelet count is normal  now and LFTs have normalized except for mildly elevated alkaline phosphatase.  Will proceed with cycle 6 as scheduled.  Will proceed with Neulasta  on the day of 5-FU pump removal with current cycle and as needed going forward.  - Schedule colonoscopy one year post-surgery-due in October 2026.  - Monitor for cardiac arrhythmias due to chemotherapy.  - Advise on dietary modifications to avoid greasy and spicy foods.  - Limit alcohol intake to two beers, not more than 1 day a week, and avoid around chemotherapy days.  RTC in 2 weeks for labs, office visit and cycle 7 of chemotherapy.  Restaging CT scan will be obtained prior to return visit.  We obtained guardant reveal for ct-DNA analysis today, 08/01/2024.  Will follow-up on the results when available.

## 2024-08-04 ENCOUNTER — Other Ambulatory Visit: Payer: Self-pay | Admitting: Internal Medicine

## 2024-08-05 ENCOUNTER — Encounter: Payer: Self-pay | Admitting: Oncology

## 2024-08-05 ENCOUNTER — Other Ambulatory Visit (HOSPITAL_COMMUNITY): Payer: Self-pay

## 2024-08-07 ENCOUNTER — Other Ambulatory Visit: Payer: Self-pay

## 2024-08-08 ENCOUNTER — Encounter: Payer: Self-pay | Admitting: Oncology

## 2024-08-09 ENCOUNTER — Encounter: Payer: Self-pay | Admitting: Oncology

## 2024-08-09 ENCOUNTER — Ambulatory Visit (HOSPITAL_COMMUNITY)
Admission: RE | Admit: 2024-08-09 | Discharge: 2024-08-09 | Disposition: A | Source: Ambulatory Visit | Attending: Oncology

## 2024-08-09 DIAGNOSIS — C180 Malignant neoplasm of cecum: Secondary | ICD-10-CM | POA: Insufficient documentation

## 2024-08-09 MED ORDER — IOHEXOL 300 MG/ML  SOLN
100.0000 mL | Freq: Once | INTRAMUSCULAR | Status: AC | PRN
  Administered 2024-08-09: 100 mL via INTRAVENOUS

## 2024-08-09 MED ORDER — HEPARIN SOD (PORK) LOCK FLUSH 100 UNIT/ML IV SOLN
INTRAVENOUS | Status: AC
  Filled 2024-08-09: qty 5

## 2024-08-09 MED ORDER — HEPARIN SOD (PORK) LOCK FLUSH 100 UNIT/ML IV SOLN
500.0000 [IU] | Freq: Once | INTRAVENOUS | Status: AC
  Administered 2024-08-09: 500 [IU] via INTRAVENOUS

## 2024-08-09 MED ORDER — IOHEXOL 9 MG/ML PO SOLN
500.0000 mL | ORAL | Status: AC
  Administered 2024-08-09 (×2): 500 mL via ORAL

## 2024-08-15 ENCOUNTER — Other Ambulatory Visit: Payer: Self-pay

## 2024-08-15 ENCOUNTER — Inpatient Hospital Stay

## 2024-08-15 ENCOUNTER — Encounter: Payer: Self-pay | Admitting: Oncology

## 2024-08-15 ENCOUNTER — Inpatient Hospital Stay: Attending: Oncology | Admitting: Oncology

## 2024-08-15 ENCOUNTER — Inpatient Hospital Stay: Attending: Oncology

## 2024-08-15 VITALS — BP 158/81 | HR 117

## 2024-08-15 VITALS — BP 159/91 | HR 109 | Temp 97.4°F | Resp 18

## 2024-08-15 DIAGNOSIS — C180 Malignant neoplasm of cecum: Secondary | ICD-10-CM

## 2024-08-15 LAB — CMP (CANCER CENTER ONLY)
ALT: 31 U/L (ref 0–44)
AST: 45 U/L — ABNORMAL HIGH (ref 15–41)
Albumin: 3.7 g/dL (ref 3.5–5.0)
Alkaline Phosphatase: 190 U/L — ABNORMAL HIGH (ref 38–126)
Anion gap: 13 (ref 5–15)
BUN: 5 mg/dL — ABNORMAL LOW (ref 8–23)
CO2: 25 mmol/L (ref 22–32)
Calcium: 9.3 mg/dL (ref 8.9–10.3)
Chloride: 102 mmol/L (ref 98–111)
Creatinine: 0.76 mg/dL (ref 0.61–1.24)
GFR, Estimated: >60 mL/min
Glucose, Bld: 180 mg/dL — ABNORMAL HIGH (ref 70–99)
Potassium: 3.6 mmol/L (ref 3.5–5.1)
Sodium: 140 mmol/L (ref 135–145)
Total Bilirubin: 0.7 mg/dL (ref 0.0–1.2)
Total Protein: 7.3 g/dL (ref 6.5–8.1)

## 2024-08-15 LAB — CBC WITH DIFFERENTIAL (CANCER CENTER ONLY)
Abs Immature Granulocytes: 0.25 10*3/uL — ABNORMAL HIGH (ref 0.00–0.07)
Basophils Absolute: 0.1 10*3/uL (ref 0.0–0.1)
Basophils Relative: 1 %
Eosinophils Absolute: 0 10*3/uL (ref 0.0–0.5)
Eosinophils Relative: 1 %
HCT: 40.9 % (ref 39.0–52.0)
Hemoglobin: 13.5 g/dL (ref 13.0–17.0)
Immature Granulocytes: 5 %
Lymphocytes Relative: 29 %
Lymphs Abs: 1.4 10*3/uL (ref 0.7–4.0)
MCH: 27.7 pg (ref 26.0–34.0)
MCHC: 33 g/dL (ref 30.0–36.0)
MCV: 84 fL (ref 80.0–100.0)
Monocytes Absolute: 0.6 10*3/uL (ref 0.1–1.0)
Monocytes Relative: 11 %
Neutro Abs: 2.7 10*3/uL (ref 1.7–7.7)
Neutrophils Relative %: 53 %
Platelet Count: 129 10*3/uL — ABNORMAL LOW (ref 150–400)
RBC: 4.87 MIL/uL (ref 4.22–5.81)
RDW: 16.8 % — ABNORMAL HIGH (ref 11.5–15.5)
WBC Count: 5.1 10*3/uL (ref 4.0–10.5)
nRBC: 0.6 % — ABNORMAL HIGH (ref 0.0–0.2)

## 2024-08-15 MED ORDER — LEUCOVORIN CALCIUM INJECTION 350 MG
400.0000 mg/m2 | Freq: Once | INTRAVENOUS | Status: AC
  Administered 2024-08-15: 972 mg via INTRAVENOUS
  Filled 2024-08-15: qty 48.6

## 2024-08-15 MED ORDER — PALONOSETRON HCL INJECTION 0.25 MG/5ML
0.2500 mg | Freq: Once | INTRAVENOUS | Status: AC
  Administered 2024-08-15: 0.25 mg via INTRAVENOUS
  Filled 2024-08-15: qty 5

## 2024-08-15 MED ORDER — FLUOROURACIL CHEMO INJECTION 2.5 GM/50ML
400.0000 mg/m2 | Freq: Once | INTRAVENOUS | Status: AC
  Administered 2024-08-15: 1000 mg via INTRAVENOUS
  Filled 2024-08-15: qty 20

## 2024-08-15 MED ORDER — SODIUM CHLORIDE 0.9 % IV SOLN
2400.0000 mg/m2 | INTRAVENOUS | Status: DC
  Administered 2024-08-15: 5850 mg via INTRAVENOUS
  Filled 2024-08-15: qty 117

## 2024-08-15 MED ORDER — DEXTROSE 5 % IV SOLN: INTRAVENOUS | Status: DC

## 2024-08-15 MED ORDER — OXALIPLATIN CHEMO INJECTION 100 MG/20ML
85.0000 mg/m2 | Freq: Once | INTRAVENOUS | Status: AC
  Administered 2024-08-15: 200 mg via INTRAVENOUS
  Filled 2024-08-15: qty 0.59

## 2024-08-15 MED ORDER — DEXAMETHASONE SOD PHOSPHATE PF 10 MG/ML IJ SOLN
10.0000 mg | Freq: Once | INTRAMUSCULAR | Status: AC
  Administered 2024-08-15: 10 mg via INTRAVENOUS
  Filled 2024-08-15: qty 1

## 2024-08-15 NOTE — Assessment & Plan Note (Signed)
 Please review oncology history for additional details and timeline of events.  After obtaining cardiac clearance, Dr. Debby performed robotic right colectomy on 04/12/2024.  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  Submucosal invasion was not identified.  Metastatic adenocarcinoma was noted in 2 out of 36 lymph nodes.  Margins were negative for carcinoma or dysplasia.  No LVI, PNI. pTis,pN1b,cM0, Stage III A disease.  MMR proficient.  Staging CT chest on 05/02/2024 showed no evidence of intrathoracic metastatic disease.  Previously CT abdomen and pelvis on 01/29/2024 showed no evidence of metastatic disease.  Stage III colon cancer located in the cecum, status post resection with negative margins.   Chemotherapy is recommended to prevent recurrence due to the risk of microscopic disease. Two chemotherapy options were discussed: FOLFOX regimen with a pump every two weeks or CAPEOX regimen with oral pills every three weeks. The FOLFOX regimen is preferred initially, with the option to switch to CAPEOX if needed due to cardiac history. Common side effects of chemotherapy include nausea, vomiting, fatigue, low blood counts, tingling, numbness, cold sensitivity, and diarrhea. The goal of chemotherapy is to prevent recurrence and aim for a complete cure.  - We did proceed with guardant reveal test on 05/03/2024 and it showed no evidence of circulating tumor DNA.  We also checked Guardant 360 to look for any actionable mutations.  In case of PIK3CA mutation, we will start him on aspirin 100 mg daily.  Plan made to proceed with adjuvant systemic chemotherapy with FOLFOX.  Started from 05/23/2024.  He has been tolerating chemotherapy reasonably well except for mild, self-limited nausea, controlled heartburn, and transient cold sensitivity and mild leukopenia.   Following completion of 6 cycles of FOLFOX, restaging CT chest, abdomen and pelvis on 08/09/2024 showed no  evidence of intra-abdominal disease.  New 7 mm ground glass nodule in the medial left lower lobe, likely infection/inflammation.  Plan to monitor on future imaging.  Prognosis is favorable, though recurrence risk persists. No dose-limiting toxicities have occurred. Ongoing adjuvant chemotherapy is intended to reduce recurrence risk.  - Administered cycle #7 of FOLFOX chemotherapy today. - Continued Neulasta  for neutropenia prophylaxis as indicated. - Ordered Guardant Reveal ctDNA test; results pending. - CBC and CMP prior to each chemotherapy cycle. - Scheduled follow-up in two weeks to review ctDNA results, laboratory studies, and reassess treatment plan. - Option to switch to CAPOX if cardiac arrhythmias develop; hold or adjust chemotherapy for severe toxicity.  - Schedule colonoscopy one year post-surgery-due in October 2026.  - Monitor for cardiac arrhythmias due to chemotherapy.  - Advise on dietary modifications to avoid greasy and spicy foods.  - Limit alcohol intake to two beers, not more than 1 day a week, and avoid around chemotherapy days.  RTC in 2 weeks for follow-up, cycle 8 of FOLFOX.

## 2024-08-15 NOTE — Progress Notes (Signed)
 "  [facility]  ONCOLOGY CLINIC PROGRESS NOTE   Patient Care Team: Barbra Odor, NP as PCP - General (Nurse Practitioner) Waddell Danelle ORN, MD as PCP - Electrophysiology (Cardiology) Raford Riggs, MD as PCP - Cardiology (Cardiology) Milissa Clack, MD as Referring Physician (Physical Medicine and Rehabilitation) Debby Hila, MD as Consulting Physician (General Surgery)  PATIENT NAME: Caleb [name]   MR#: [id number] DOB: 1961-04-15  Date of visit: 08/15/2024   ASSESSMENT & PLAN:   ZACCHAEUS HALM is a 64 y.o.  gentleman with a past medical history of hypertension, diabetes mellitus type 2, dyslipidemia, atrial flutter status post ablation, was referred to our clinic for newly diagnosed adenocarcinoma of the cecum, stage III, MMR proficient, status post hemicolectomy on 04/12/2024.   Adenocarcinoma of cecum [facility]) Please review oncology history for additional details and timeline of events.  After obtaining cardiac clearance, Dr. Debby performed robotic right colectomy on 04/12/2024.  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  Submucosal invasion was not identified.  Metastatic adenocarcinoma was noted in 2 out of 36 lymph nodes.  Margins were negative for carcinoma or dysplasia.  No LVI, PNI. pTis,pN1b,cM0, Stage III A disease.  MMR proficient.  Staging CT chest on 05/02/2024 showed no evidence of intrathoracic metastatic disease.  Previously CT abdomen and pelvis on 01/29/2024 showed no evidence of metastatic disease.  Stage III colon cancer located in the cecum, status post resection with negative margins.   Chemotherapy is recommended to prevent recurrence due to the risk of microscopic disease. Two chemotherapy options were discussed: FOLFOX regimen with a pump every two weeks or CAPEOX regimen with oral pills every three weeks. The FOLFOX regimen is preferred initially, with the option to switch to CAPEOX if needed  due to cardiac history. Common side effects of chemotherapy include nausea, vomiting, fatigue, low blood counts, tingling, numbness, cold sensitivity, and diarrhea. The goal of chemotherapy is to prevent recurrence and aim for a complete cure.  - We did proceed with guardant reveal test on 05/03/2024 and it showed no evidence of circulating tumor DNA.  We also checked Guardant 360 to look for any actionable mutations.  In case of PIK3CA mutation, we will start him on aspirin 100 mg daily.  Plan made to proceed with adjuvant systemic chemotherapy with FOLFOX.  Started from 05/23/2024.  He has been tolerating chemotherapy reasonably well except for mild, self-limited nausea, controlled heartburn, and transient cold sensitivity and mild leukopenia.   Following completion of 6 cycles of FOLFOX, restaging CT chest, abdomen and pelvis on 08/09/2024 showed no evidence of intra-abdominal disease.  New 7 mm ground glass nodule in the medial left lower lobe, likely infection/inflammation.  Plan to monitor on future imaging.  Prognosis is favorable, though recurrence risk persists. No dose-limiting toxicities have occurred. Ongoing adjuvant chemotherapy is intended to reduce recurrence risk.  - Administered cycle #7 of FOLFOX chemotherapy today. - Continued Neulasta  for neutropenia prophylaxis as indicated. - Ordered Guardant Reveal ctDNA test; results pending. - CBC and CMP prior to each chemotherapy cycle. - Scheduled follow-up in two weeks to review ctDNA results, laboratory studies, and reassess treatment plan. - Option to switch to CAPOX if cardiac arrhythmias develop; hold or adjust chemotherapy for severe toxicity.  - Schedule colonoscopy one year post-surgery-due in October 2026.  - Monitor for cardiac arrhythmias due to chemotherapy.  - Advise on dietary modifications to avoid greasy and spicy foods.  - Limit alcohol intake to two beers, not more  than 1 day a week, and avoid around chemotherapy  days.  RTC in 2 weeks for follow-up, cycle 8 of FOLFOX.  Chemotherapy-induced peripheral neuropathy He experiences cold sensitivity, tingling, and numbness consistent with oxaliplatin -induced neuropathy. No severe or disabling neuropathy at present. - Monitored neuropathy symptoms at each visit; consider dose reduction or discontinuation of oxaliplatin  if symptoms worsen or become functionally limiting. - Provided education on cold avoidance and neuropathy precautions.  Solitary pulmonary nodule A new 7 mm pulmonary nodule was identified on recent imaging. Differential includes benign post-infectious or inflammatory changes, given recent upper respiratory congestion. Due to cancer history, close surveillance is warranted. Nodule is too small for biopsy. - Ordered repeat chest imaging in three months to monitor pulmonary nodule.  Fatty liver with hepatomegaly Imaging demonstrates mild hepatomegaly consistent with fatty liver. Alkaline phosphatase is mildly elevated but stable. No evidence of liver metastasis or acute dysfunction. - Monitor liver function tests (CMP) periodically. - Monitor for signs of liver dysfunction or progression.  Gastroesophageal reflux disease Symptoms improved with pantoprazole .  - Continue pantoprazole   Atrial flutter, status post ablation, on flecainide  Atrial flutter, status post two ablations with the second being successful. Currently on flecainide . Chemotherapy may increase the risk of cardiac arrhythmias, particularly with the FOLFOX regimen. - Monitor cardiac status closely during chemotherapy. - Consider switching to CAPOX regimen if cardiac issues arise.    I reviewed lab results and outside records for this visit and discussed relevant results with the patient. Diagnosis, plan of care and treatment options were also discussed in detail with the patient. Opportunity provided to ask questions and answers provided to his apparent satisfaction. Provided  instructions to call our clinic with any problems, questions or concerns prior to return visit. I recommended to continue follow-up with PCP and sub-specialists. He verbalized understanding and agreed with the plan.   NCCN guidelines have been consulted in the planning of this patients care.  I spent a total of 42 minutes during this encounter with the patient including review of chart and various tests results, discussions about plan of care and coordination of care plan.   [name], MD  08/15/2024  [facility] - [facility] DEPT [facility] [address] [place] [address] Dept: [phone] Dept Fax: [phone]    CHIEF COMPLAINT/ REASON FOR VISIT:   Adenocarcinoma of the cecum, stage III, MMR proficient, status post hemicolectomy on 04/12/2024.   Current Treatment: Started adjuvant systemic chemotherapy with FOLFOX from 05/23/2024.  INTERVAL HISTORY:    Discussed the use of AI scribe software for clinical note transcription with the patient, who gave verbal consent to proceed.  History of Present Illness  Caleb [name] is a 64 year old male with stage IIIA cecal adenocarcinoma status post right hemicolectomy who presents for follow-up during adjuvant FOLFOX chemotherapy.  He is currently receiving adjuvant FOLFOX chemotherapy, with today marking his seventh cycle. Recent imaging demonstrated stable post-surgical changes in the abdomen without evidence of recurrence. A new 7 mm solitary pulmonary nodule was identified, which is too small for biopsy and may be related to recent upper respiratory congestion, as mild bronchial changes were also noted. Previously circulating tumor DNA was negative; the most recent ctDNA result is pending.  He continues to experience chemotherapy-induced thrombocytopenia, with a recent platelet count of 129,000, which remains within a safe range for ongoing treatment. White blood  cell count and hemoglobin are stable and within normal limits. Alkaline  phosphatase is mildly elevated at 190; chronic mild hepatomegaly and splenomegaly have been previously noted. He denies alcohol or acetaminophen  use.  He receives growth factor injections (opacipine) following each chemotherapy cycle and has developed severe knee and foot pain approximately one week after treatment. He noted improvement in pain after taking cetirizine.  Aug 01, 2024: Follow-up for stage III cecal adenocarcinoma post right hemicolectomy and adjuvant FOLFOX chemotherapy; completed five cycles, due for cycle six today. Tolerating chemotherapy with mild nausea, heartburn, cold sensitivity, leukopenia, and managed diarrhea; peripheral neuropathy monitored for dose adjustment. Labs show no dose-limiting toxicities; growth factor injections continued for neutropenia; cardiac status monitored due to history of atrial flutter; restaging CT and ct-DNA analysis planned before next visit.   I have reviewed the past medical history, past surgical history, social history and family history with the patient and they are unchanged from previous note.  HISTORY OF PRESENT ILLNESS:   ONCOLOGY HISTORY:   Patient had a screening colonoscopy when he was 64 years old which showed some benign polyps.  He did not have a follow-up colonoscopy after that.   He had mild anemia in June 2025 with hemoglobin of 12.1, MCV 76.  His PCP obtained additional workup including Cologuard test.  Cologuard came back positive.   He had a colonoscopy on 01/22/2024, performed by Dr. Abran.  It showed 12 mm apparently did polyp in the rectum, 4 polyps in the descending colon and transverse colon measuring 3 to 8 mm in size.  In the cecum there was a 4 to 5 cm soft polypoid mass which was friable.  This was biopsied with cold forceps.  In addition there were 4 PET included in semipedunculated polyps measuring between 10 and 25 mm.  This area was tattooed.   Multiple diverticula were noted in the colon.  Internal hemorrhoids.  He had an EGD that day which showed evidence of esophagitis and stricture.  Hiatal hernia was noted.  Question portal hypertensive gastropathy.   Pathology showed superficial fragments of tubulovillous adenoma from the cecal mass.  It was negative for high-grade dysplasia or carcinoma.  Rest of the polyps were either hyperplastic or tubular adenomas without evidence of malignancy or high-grade dysplasia.   On 01/29/2024, CT abdomen and pelvis was obtained for further evaluation which showed what appeared to be 4.3 cm intraluminal soft tissue density in the ascending colon, highly suspicious for polypoid intraluminal mass.  No evidence of metastatic disease.  Colonic diverticulosis was noted.  Probable hepatic cirrhosis was also noted.   With these findings, patient was referred to Dr. Bernarda Ned, general surgery for further evaluation.  Though the biopsy did not show evidence of malignancy, given the size of the mass and clinical picture, Dr. Ned recommended proceeding with robotic assisted right colectomy.   After obtaining cardiac clearance, Dr. Ned performed robotic right colectomy on 04/12/2024.  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  Submucosal invasion was not identified.  Metastatic adenocarcinoma was noted in 2 out of 36 lymph nodes.  Margins were negative for carcinoma or dysplasia.  No LVI, PNI. pTis,pN1b,cM0, Stage III A disease.  MMR proficient.   Staging CT chest on 05/02/2024 showed no evidence of intrathoracic metastatic disease.   Given lymph node positive adenocarcinoma of the cecum, recommended adjuvant chemotherapy with FOLFOX regimen.  We will watch him closely for any development of cardiac arrhythmias.  Started cycle 1 of FOLFOX from 05/23/2024.  Following completion of 6 cycles of FOLFOX, restaging CT  chest, abdomen and pelvis on 08/09/2024 showed no evidence  of intra-abdominal disease.  New 7 mm ground glass nodule in the medial left lower lobe, likely infection/inflammation.  Plan to monitor on future imaging.  Oncology History  Adenocarcinoma of cecum (HCC)  05/03/2024 Initial Diagnosis   Adenocarcinoma of cecum (HCC)   05/03/2024 Cancer Staging   Staging form: Colon and Rectum, AJCC 8th Edition - Clinical: Stage IIIA (cT1, cN1b, cM0) - Signed by Autumn Millman, MD on 05/03/2024 Histologic grade (G): G1 Histologic grading system: 4 grade system   05/23/2024 -  Chemotherapy   Patient is on Treatment Plan : COLORECTAL FOLFOX q14d x 3 months         REVIEW OF SYSTEMS:   Review of Systems - Oncology  All other pertinent systems were reviewed with the patient and are negative.  ALLERGIES: He has no known allergies.  MEDICATIONS:  Current Outpatient Medications  Medication Sig Dispense Refill   atorvastatin  (LIPITOR) 20 MG tablet Take 20 mg by mouth daily.     Blood Glucose Monitoring Suppl (TRUE METRIX AIR GLUCOSE METER) DEVI Check CBG BID 1 each 11   colchicine  0.6 MG tablet Take 1 tablet (0.6 mg total) by mouth as needed (gout). 30 tablet 3   dexamethasone  (DECADRON ) 4 MG tablet Take 2 tablets (8 mg total) by mouth daily. Start the day after chemotherapy for 2 days. Take with food. 30 tablet 1   flecainide  (TAMBOCOR ) 100 MG tablet TAKE 1 TABLET BY MOUTH EVERY 12 HOURS 180 tablet 2   lidocaine -prilocaine  (EMLA ) cream Apply to affected area once 30 g 3   megestrol  (MEGACE  ES) 625 MG/5ML suspension Take 5 mLs (625 mg total) by mouth daily. 150 mL 3   metFORMIN  (GLUCOPHAGE ) 500 MG tablet Take 500-1,000 mg by mouth See admin instructions. Take 1000 mg in the morning and 500 mg in the evening     metoprolol  succinate (TOPROL -XL) 50 MG 24 hr tablet Take 1 tablet by mouth twice daily 30 tablet 0   metoprolol  succinate (TOPROL -XL) 50 MG 24 hr tablet Take 1 tablet (50 mg total) by mouth 2 (two) times daily. 30 tablet 0   mirtazapine   (REMERON ) 15 MG tablet Take 1 tablet (15 mg total) by mouth at bedtime. 30 tablet 3   pantoprazole  (PROTONIX ) 40 MG tablet Take 1 tablet (40 mg total) by mouth daily. 30 tablet 5   traMADol  (ULTRAM ) 50 MG tablet Take 1 tablet (50 mg total) by mouth every 6 (six) hours as needed for moderate pain (pain score 4-6) or severe pain (pain score 7-10). 30 tablet 0   diphenoxylate -atropine  (LOMOTIL ) 2.5-0.025 MG tablet Take 1 tablet by mouth 4 (four) times daily as needed for diarrhea or loose stools. (Patient not taking: Reported on 08/15/2024) 40 tablet 1   ondansetron  (ZOFRAN ) 8 MG tablet Take 1 tablet (8 mg total) by mouth every 8 (eight) hours as needed for nausea or vomiting. Start on the third day after chemotherapy. (Patient not taking: Reported on 08/15/2024) 30 tablet 1   prochlorperazine  (COMPAZINE ) 10 MG tablet Take 1 tablet (10 mg total) by mouth every 6 (six) hours as needed for nausea or vomiting. (Patient not taking: Reported on 08/15/2024) 30 tablet 1   tadalafil (CIALIS) 20 MG tablet Take 20 mg by mouth daily as needed for erectile dysfunction. (Patient not taking: Reported on 08/15/2024)     No current facility-administered medications for this visit.   Facility-Administered Medications Ordered in Other Visits  Medication Dose Route  Frequency Provider Last Rate Last Admin   dextrose  5 % solution   Intravenous Continuous Gearldene Fiorenza, MD 10 mL/hr at 08/15/24 1053 New Bag at 08/15/24 1053   fluorouracil  (ADRUCIL ) 5,850 mg in sodium chloride  0.9 % 133 mL chemo infusion  2,400 mg/m2 (Treatment Plan Recorded) Intravenous 1 day or 1 dose Brisia Schuermann, MD       fluorouracil  (ADRUCIL ) chemo injection 1,000 mg  400 mg/m2 (Treatment Plan Recorded) Intravenous Once Aysia Lowder, MD       leucovorin  972 mg in dextrose  5 % 250 mL infusion  400 mg/m2 (Treatment Plan Recorded) Intravenous Once Lauriana Denes, MD 149 mL/hr at 08/15/24 1139 972 mg at 08/15/24 1139   oxaliplatin  (ELOXATIN ) 200 mg in dextrose   5 % 500 mL chemo infusion  85 mg/m2 (Treatment Plan Recorded) Intravenous Once Craige Patel, MD 270 mL/hr at 08/15/24 1143 200 mg at 08/15/24 1143     VITALS:   Blood pressure (!) 158/81, pulse (!) 117.  Wt Readings from Last 3 Encounters:  08/15/24 229 lb 4.8 oz (104 kg)  08/01/24 229 lb 14.4 oz (104.3 kg)  07/20/24 237 lb 12 oz (107.8 kg)    There is no height or weight on file to calculate BMI.   Onc Performance Status - 08/15/24 1021       ECOG Perf Status   ECOG Perf Status Restricted in physically strenuous activity but ambulatory and able to carry out work of a light or sedentary nature, e.g., light house work, office work      KPS SCALE   KPS % SCORE Normal activity with effort, some s/s of disease             PHYSICAL EXAM:   Physical Exam Constitutional:      General: He is not in acute distress.    Appearance: Normal appearance.  HENT:     Head: Normocephalic and atraumatic.  Eyes:     Conjunctiva/sclera: Conjunctivae normal.  Cardiovascular:     Rate and Rhythm: Normal rate and regular rhythm.     Heart sounds: Murmur heard.  Pulmonary:     Effort: Pulmonary effort is normal. No respiratory distress.  Chest:     Comments: Port-A-Cath in place without any signs of infection Abdominal:     General: There is no distension.     Comments: Scars from recent surgery have healed well  Neurological:     General: No focal deficit present.     Mental Status: He is alert and oriented to person, place, and time.  Psychiatric:        Mood and Affect: Mood normal.        Behavior: Behavior normal.      LABORATORY DATA:   I have reviewed the data as listed.  Results for orders placed or performed in visit on 08/15/24  CMP (Cancer Center only)  Result Value Ref Range   Sodium 140 135 - 145 mmol/L   Potassium 3.6 3.5 - 5.1 mmol/L   Chloride 102 98 - 111 mmol/L   CO2 25 22 - 32 mmol/L   Glucose, Bld 180 (H) 70 - 99 mg/dL   BUN 5 (L) 8 - 23 mg/dL    Creatinine 9.23 9.38 - 1.24 mg/dL   Calcium  9.3 8.9 - 10.3 mg/dL   Total Protein 7.3 6.5 - 8.1 g/dL   Albumin  3.7 3.5 - 5.0 g/dL   AST 45 (H) 15 - 41 U/L   ALT 31 0 - 44 U/L  Alkaline Phosphatase 190 (H) 38 - 126 U/L   Total Bilirubin 0.7 0.0 - 1.2 mg/dL   GFR, Estimated >39 >39 mL/min   Anion gap 13 5 - 15  CBC with Differential (Cancer Center Only)  Result Value Ref Range   WBC Count 5.1 4.0 - 10.5 K/uL   RBC 4.87 4.22 - 5.81 MIL/uL   Hemoglobin 13.5 13.0 - 17.0 g/dL   HCT 59.0 60.9 - 47.9 %   MCV 84.0 80.0 - 100.0 fL   MCH 27.7 26.0 - 34.0 pg   MCHC 33.0 30.0 - 36.0 g/dL   RDW 83.1 (H) 88.4 - 84.4 %   Platelet Count 129 (L) 150 - 400 K/uL   nRBC 0.6 (H) 0.0 - 0.2 %   Neutrophils Relative % 53 %   Neutro Abs 2.7 1.7 - 7.7 K/uL   Lymphocytes Relative 29 %   Lymphs Abs 1.4 0.7 - 4.0 K/uL   Monocytes Relative 11 %   Monocytes Absolute 0.6 0.1 - 1.0 K/uL   Eosinophils Relative 1 %   Eosinophils Absolute 0.0 0.0 - 0.5 K/uL   Basophils Relative 1 %   Basophils Absolute 0.1 0.0 - 0.1 K/uL   Immature Granulocytes 5 %   Abs Immature Granulocytes 0.25 (H) 0.00 - 0.07 K/uL      RADIOGRAPHIC STUDIES:  I have personally reviewed the radiological images as listed and agree with the findings in the report.  CT CHEST ABDOMEN PELVIS W CONTRAST EXAM: CT CHEST WITH CONTRAST 08/09/2024 09:49:43 AM  TECHNIQUE: CT of the chest was performed with the administration of 100 mL of iohexol  (OMNIPAQUE ) 300 MG/ML solution. Multiplanar reformatted images are provided for review. Automated exposure control, iterative reconstruction, and/or weight based adjustment of the mA/kV was utilized to reduce the radiation dose to as low as reasonably achievable.  COMPARISON: CT chest 05/02/2024 and CT abdomen and pelvis 01/29/2024.  CLINICAL HISTORY: Restaging stage 3 colon cancer.  FINDINGS:  MEDIASTINUM: Heart and pericardium are unremarkable. Right chest port catheter tip ends in the  SVC. The central airways are clear.  LYMPH NODES: No mediastinal, hilar or axillary lymphadenopathy.  LUNGS AND PLEURA: Ground glass nodule in the medial left lower lobe image 5/80 measures 7 mm and appears new. There are linear secretions in the bilateral mainstem bronchi. No focal consolidation or pulmonary edema. No pleural effusion or pneumothorax.  SOFT TISSUES/BONES: Screen insert chest CT. No arterial or posterior fusion hardware present. No acute abnormality of the bones or soft tissues.  UPPER ABDOMEN: Liver is enlarged, unchanged. No focal hepatic mass. Previously calcified 9 cm splenic cyst is unchanged. Spleen is mildly enlarged, unchanged. There is a cortical cyst in the middle of the right kidney measuring 13 mm, unchanged. Ileostomy status post right hemicolectomy changes. There is descending and sigmoid colon diverticulosis, unchanged from prior. There are small fat containing inguinal hernias.  IMPRESSION: 1. New 7 mm ground-glass nodule in the medial left lower lobe. Per Fleischner Society Guidelines recommend a non-contrast chest CT at 6-12 months to confirm persistence, then additional non-contrast chest CTs every 2 years until 5 years. If nodule grows or develops solid component(s), consider resection. 2. Linear secretions in the bilateral mainstem bronchi. 3. No evidence of metastatic disease.  Electronically signed by: Greig Pique MD 08/11/2024 08:52 PM EST RP Workstation: [id number]    CODE STATUS:  Code Status History     Date Active Date Inactive Code Status Order ID Comments User Context   05/16/2024 1451 05/17/2024 9478  Full Code [id number]  Jenna Cordella LABOR, MD [facility]OV   04/12/2024 1523 04/13/2024 1633 Full Code [id number]  Debby Hila, MD Inpatient   09/29/2021 1048 09/29/2021 2301 Full Code [id number]  Waddell Danelle ORN, MD Inpatient   08/02/2021 1122 08/02/2021 2215 Full Code [id number]  Waddell Danelle ORN, MD Inpatient   09/30/2020 1930 10/01/2020 1719 Full  Code [id number]  Cheryle Debby LABOR, MD Inpatient   04/03/2020 2150 04/08/2020 2149 Full Code [id number]  Shona Terry SAILOR, DO Inpatient    Questions for Most Recent Historical Code Status (Order [id number])     Question Answer   By: Consent: discussion documented in EHR            Orders Placed This Encounter  Procedures   CBC with Differential (Cancer Center Only)    Standing Status:   Future    Expected Date:   09/12/2024    Expiration Date:   09/12/2025   CMP (Cancer Center only)    Standing Status:   Future    Expected Date:   09/12/2024    Expiration Date:   09/12/2025     Future Appointments  Date Time Provider Department Center  08/16/2024  2:30 PM Ivonne Harlene RAMAN, RD [facility] None  08/17/2024 12:00 PM [facility] FLUSH [facility] None  08/28/2024 11:30 AM [facility] FLUSH [facility] None  08/28/2024 11:45 AM Venus Ruhe, MD [facility] None  08/28/2024 12:30 PM [facility] INFUSION [facility] None  08/30/2024  2:00 PM [facility] FLUSH [facility] None     This document was completed utilizing speech recognition software. Grammatical errors, random word insertions, pronoun errors, and incomplete sentences are an occasional consequence of this system due to software limitations, ambient noise, and hardware issues. Any formal questions or concerns about the content, text or information contained within the body of this dictation should be directly addressed to the provider for clarification.   "

## 2024-08-16 ENCOUNTER — Inpatient Hospital Stay: Admitting: Dietician

## 2024-08-16 ENCOUNTER — Encounter: Payer: Self-pay | Admitting: Oncology

## 2024-08-16 LAB — GUARDANT REVEAL

## 2024-08-16 NOTE — Progress Notes (Signed)
 Nutrition Follow-up:  Pt with adenocarcinoma of cecum s/p resection. He is receiving Folfox q14d. Patient is under the care of Dr. Autumn   Patient scheduled d/t 1/22 referral, however RD saw pt on 1/22  Met with patient in office. He had chemo yesterday and if feeling sluggish today. Patient reports appetite is improved. Cold sensitivity lasting 12-13 days after treatment. Reports electric hand warmers are very helpful. He has difficulty drinking room temperature water . Consumes gatorade, cran-grape juice, and pineapple juice. He tried the ONS samples and did not like them. Patient denies nausea, constipation, diarrhea.  He is looking forward to his last treatment planned 2/18.   Medications: reviewed   Labs: 2/5 - glucose 180, BUN 5  Anthropometrics: Wt 229 lb 4.8 oz on 2/5 - stable  1/22 - 229 lb 14.4 oz    NUTRITION DIAGNOSIS: Unintended wt loss - stable    INTERVENTION:  Encourage high protein snacks in between meals Offered warm supplement ideas     MONITORING, EVALUATION, GOAL: wt trends, intake   NEXT VISIT: No follow-up scheduled. Please refer back to nutrition should concerns arise

## 2024-08-17 ENCOUNTER — Inpatient Hospital Stay

## 2024-08-28 ENCOUNTER — Inpatient Hospital Stay

## 2024-08-28 ENCOUNTER — Inpatient Hospital Stay: Admitting: Oncology

## 2024-08-30 ENCOUNTER — Inpatient Hospital Stay
